# Patient Record
Sex: Female | Born: 1945
Health system: Southern US, Community
[De-identification: ages and names within clinical notes are randomized; demographics above are authoritative.]

## PROBLEM LIST (undated history)

## (undated) DIAGNOSIS — K5790 Diverticulosis of intestine, part unspecified, without perforation or abscess without bleeding: Secondary | ICD-10-CM

## (undated) DIAGNOSIS — M1990 Unspecified osteoarthritis, unspecified site: Secondary | ICD-10-CM

## (undated) DIAGNOSIS — I10 Essential (primary) hypertension: Secondary | ICD-10-CM

## (undated) DIAGNOSIS — E119 Type 2 diabetes mellitus without complications: Secondary | ICD-10-CM

## (undated) HISTORY — PX: DILATION AND CURETTAGE OF UTERUS: SHX78

## (undated) HISTORY — PX: TUBAL LIGATION: SHX77

## (undated) HISTORY — PX: CATARACT EXTRACTION: SUR2

---

## 2001-07-25 ENCOUNTER — Other Ambulatory Visit: Admission: RE | Admit: 2001-07-25 | Discharge: 2001-07-25 | Payer: Self-pay | Admitting: Family Medicine

## 2005-08-13 ENCOUNTER — Ambulatory Visit: Payer: Self-pay | Admitting: Family Medicine

## 2007-03-28 ENCOUNTER — Emergency Department: Payer: Self-pay | Admitting: Emergency Medicine

## 2007-03-29 ENCOUNTER — Ambulatory Visit: Payer: Self-pay | Admitting: Urology

## 2007-03-30 ENCOUNTER — Ambulatory Visit: Payer: Self-pay | Admitting: Urology

## 2007-03-30 ENCOUNTER — Other Ambulatory Visit: Payer: Self-pay

## 2007-03-31 ENCOUNTER — Ambulatory Visit: Payer: Self-pay | Admitting: Urology

## 2007-04-11 ENCOUNTER — Ambulatory Visit: Payer: Self-pay | Admitting: Urology

## 2008-02-17 ENCOUNTER — Ambulatory Visit: Payer: Self-pay | Admitting: Family Medicine

## 2009-10-14 ENCOUNTER — Ambulatory Visit: Payer: Self-pay | Admitting: Family Medicine

## 2009-10-16 ENCOUNTER — Ambulatory Visit: Payer: Self-pay | Admitting: Family Medicine

## 2011-05-21 ENCOUNTER — Ambulatory Visit: Payer: Self-pay | Admitting: Family Medicine

## 2011-12-26 ENCOUNTER — Emergency Department: Payer: Self-pay | Admitting: Unknown Physician Specialty

## 2011-12-26 LAB — CBC
HGB: 15.1 g/dL (ref 12.0–16.0)
MCH: 32.4 pg (ref 26.0–34.0)
MCV: 94 fL (ref 80–100)
Platelet: 251 10*3/uL (ref 150–440)
RDW: 13.9 % (ref 11.5–14.5)

## 2011-12-26 LAB — COMPREHENSIVE METABOLIC PANEL
Alkaline Phosphatase: 89 U/L (ref 50–136)
Anion Gap: 11 (ref 7–16)
Calcium, Total: 9.2 mg/dL (ref 8.5–10.1)
Co2: 26 mmol/L (ref 21–32)
EGFR (African American): >60
EGFR (Non-African Amer.): >60
Glucose: 90 mg/dL (ref 65–99)
Osmolality: 281 (ref 275–301)
Potassium: 3.6 mmol/L (ref 3.5–5.1)
SGOT(AST): 22 U/L (ref 15–37)
SGPT (ALT): 45 U/L (ref 12–78)
Sodium: 140 mmol/L (ref 136–145)
Total Protein: 8.2 g/dL (ref 6.4–8.2)

## 2011-12-26 LAB — URINALYSIS, COMPLETE
Blood: NEGATIVE
Leukocyte Esterase: NEGATIVE
Nitrite: NEGATIVE
Ph: 7 (ref 4.5–8.0)
Squamous Epithelial: 1

## 2012-05-16 ENCOUNTER — Ambulatory Visit: Payer: Self-pay | Admitting: Family Medicine

## 2012-06-06 ENCOUNTER — Ambulatory Visit: Payer: Self-pay | Admitting: Family Medicine

## 2013-05-09 DIAGNOSIS — H02839 Dermatochalasis of unspecified eye, unspecified eyelid: Secondary | ICD-10-CM | POA: Diagnosis not present

## 2013-05-09 DIAGNOSIS — H25049 Posterior subcapsular polar age-related cataract, unspecified eye: Secondary | ICD-10-CM | POA: Diagnosis not present

## 2013-05-09 DIAGNOSIS — H25019 Cortical age-related cataract, unspecified eye: Secondary | ICD-10-CM | POA: Diagnosis not present

## 2013-05-09 DIAGNOSIS — H2510 Age-related nuclear cataract, unspecified eye: Secondary | ICD-10-CM | POA: Diagnosis not present

## 2013-05-09 DIAGNOSIS — H18419 Arcus senilis, unspecified eye: Secondary | ICD-10-CM | POA: Diagnosis not present

## 2013-06-06 DIAGNOSIS — M545 Low back pain, unspecified: Secondary | ICD-10-CM | POA: Diagnosis not present

## 2013-06-06 DIAGNOSIS — M542 Cervicalgia: Secondary | ICD-10-CM | POA: Diagnosis not present

## 2013-06-06 DIAGNOSIS — M546 Pain in thoracic spine: Secondary | ICD-10-CM | POA: Diagnosis not present

## 2013-06-06 DIAGNOSIS — M5137 Other intervertebral disc degeneration, lumbosacral region: Secondary | ICD-10-CM | POA: Diagnosis not present

## 2013-06-06 DIAGNOSIS — M9981 Other biomechanical lesions of cervical region: Secondary | ICD-10-CM | POA: Diagnosis not present

## 2013-06-06 DIAGNOSIS — M999 Biomechanical lesion, unspecified: Secondary | ICD-10-CM | POA: Diagnosis not present

## 2013-06-06 DIAGNOSIS — M5030 Other cervical disc degeneration, unspecified cervical region: Secondary | ICD-10-CM | POA: Diagnosis not present

## 2013-06-07 DIAGNOSIS — M545 Low back pain, unspecified: Secondary | ICD-10-CM | POA: Diagnosis not present

## 2013-06-07 DIAGNOSIS — M546 Pain in thoracic spine: Secondary | ICD-10-CM | POA: Diagnosis not present

## 2013-06-07 DIAGNOSIS — M5137 Other intervertebral disc degeneration, lumbosacral region: Secondary | ICD-10-CM | POA: Diagnosis not present

## 2013-06-07 DIAGNOSIS — M542 Cervicalgia: Secondary | ICD-10-CM | POA: Diagnosis not present

## 2013-06-07 DIAGNOSIS — M5030 Other cervical disc degeneration, unspecified cervical region: Secondary | ICD-10-CM | POA: Diagnosis not present

## 2013-06-07 DIAGNOSIS — M999 Biomechanical lesion, unspecified: Secondary | ICD-10-CM | POA: Diagnosis not present

## 2013-06-07 DIAGNOSIS — M9981 Other biomechanical lesions of cervical region: Secondary | ICD-10-CM | POA: Diagnosis not present

## 2013-06-09 DIAGNOSIS — M545 Low back pain, unspecified: Secondary | ICD-10-CM | POA: Diagnosis not present

## 2013-06-09 DIAGNOSIS — M542 Cervicalgia: Secondary | ICD-10-CM | POA: Diagnosis not present

## 2013-06-09 DIAGNOSIS — M999 Biomechanical lesion, unspecified: Secondary | ICD-10-CM | POA: Diagnosis not present

## 2013-06-09 DIAGNOSIS — M5030 Other cervical disc degeneration, unspecified cervical region: Secondary | ICD-10-CM | POA: Diagnosis not present

## 2013-06-09 DIAGNOSIS — M5137 Other intervertebral disc degeneration, lumbosacral region: Secondary | ICD-10-CM | POA: Diagnosis not present

## 2013-06-09 DIAGNOSIS — M9981 Other biomechanical lesions of cervical region: Secondary | ICD-10-CM | POA: Diagnosis not present

## 2013-06-09 DIAGNOSIS — M546 Pain in thoracic spine: Secondary | ICD-10-CM | POA: Diagnosis not present

## 2013-06-12 DIAGNOSIS — M999 Biomechanical lesion, unspecified: Secondary | ICD-10-CM | POA: Diagnosis not present

## 2013-06-12 DIAGNOSIS — M5030 Other cervical disc degeneration, unspecified cervical region: Secondary | ICD-10-CM | POA: Diagnosis not present

## 2013-06-12 DIAGNOSIS — M545 Low back pain, unspecified: Secondary | ICD-10-CM | POA: Diagnosis not present

## 2013-06-12 DIAGNOSIS — M9981 Other biomechanical lesions of cervical region: Secondary | ICD-10-CM | POA: Diagnosis not present

## 2013-06-12 DIAGNOSIS — M542 Cervicalgia: Secondary | ICD-10-CM | POA: Diagnosis not present

## 2013-06-12 DIAGNOSIS — M546 Pain in thoracic spine: Secondary | ICD-10-CM | POA: Diagnosis not present

## 2013-06-12 DIAGNOSIS — M5137 Other intervertebral disc degeneration, lumbosacral region: Secondary | ICD-10-CM | POA: Diagnosis not present

## 2013-06-16 DIAGNOSIS — M5030 Other cervical disc degeneration, unspecified cervical region: Secondary | ICD-10-CM | POA: Diagnosis not present

## 2013-06-16 DIAGNOSIS — M999 Biomechanical lesion, unspecified: Secondary | ICD-10-CM | POA: Diagnosis not present

## 2013-06-16 DIAGNOSIS — M542 Cervicalgia: Secondary | ICD-10-CM | POA: Diagnosis not present

## 2013-06-16 DIAGNOSIS — M9981 Other biomechanical lesions of cervical region: Secondary | ICD-10-CM | POA: Diagnosis not present

## 2013-06-16 DIAGNOSIS — M5137 Other intervertebral disc degeneration, lumbosacral region: Secondary | ICD-10-CM | POA: Diagnosis not present

## 2013-06-16 DIAGNOSIS — M545 Low back pain, unspecified: Secondary | ICD-10-CM | POA: Diagnosis not present

## 2013-06-16 DIAGNOSIS — M546 Pain in thoracic spine: Secondary | ICD-10-CM | POA: Diagnosis not present

## 2013-06-30 DIAGNOSIS — M999 Biomechanical lesion, unspecified: Secondary | ICD-10-CM | POA: Diagnosis not present

## 2013-06-30 DIAGNOSIS — M5137 Other intervertebral disc degeneration, lumbosacral region: Secondary | ICD-10-CM | POA: Diagnosis not present

## 2013-06-30 DIAGNOSIS — M546 Pain in thoracic spine: Secondary | ICD-10-CM | POA: Diagnosis not present

## 2013-06-30 DIAGNOSIS — M542 Cervicalgia: Secondary | ICD-10-CM | POA: Diagnosis not present

## 2013-06-30 DIAGNOSIS — M9981 Other biomechanical lesions of cervical region: Secondary | ICD-10-CM | POA: Diagnosis not present

## 2013-06-30 DIAGNOSIS — M545 Low back pain, unspecified: Secondary | ICD-10-CM | POA: Diagnosis not present

## 2013-06-30 DIAGNOSIS — M5030 Other cervical disc degeneration, unspecified cervical region: Secondary | ICD-10-CM | POA: Diagnosis not present

## 2013-07-03 DIAGNOSIS — H52209 Unspecified astigmatism, unspecified eye: Secondary | ICD-10-CM | POA: Diagnosis not present

## 2013-07-03 DIAGNOSIS — H269 Unspecified cataract: Secondary | ICD-10-CM | POA: Diagnosis not present

## 2013-07-03 DIAGNOSIS — H2510 Age-related nuclear cataract, unspecified eye: Secondary | ICD-10-CM | POA: Diagnosis not present

## 2013-09-05 ENCOUNTER — Ambulatory Visit: Payer: Self-pay | Admitting: Family Medicine

## 2013-09-05 DIAGNOSIS — Z111 Encounter for screening for respiratory tuberculosis: Secondary | ICD-10-CM | POA: Diagnosis not present

## 2013-09-05 DIAGNOSIS — Z01419 Encounter for gynecological examination (general) (routine) without abnormal findings: Secondary | ICD-10-CM | POA: Diagnosis not present

## 2013-09-05 DIAGNOSIS — Z1342 Encounter for screening for global developmental delays (milestones): Secondary | ICD-10-CM | POA: Diagnosis not present

## 2013-09-05 DIAGNOSIS — R7309 Other abnormal glucose: Secondary | ICD-10-CM | POA: Diagnosis not present

## 2013-09-05 DIAGNOSIS — E7800 Pure hypercholesterolemia, unspecified: Secondary | ICD-10-CM | POA: Diagnosis not present

## 2013-09-05 DIAGNOSIS — Z1231 Encounter for screening mammogram for malignant neoplasm of breast: Secondary | ICD-10-CM | POA: Diagnosis not present

## 2013-09-05 DIAGNOSIS — Z0000 Encounter for general adult medical examination without abnormal findings: Secondary | ICD-10-CM | POA: Diagnosis not present

## 2013-09-05 DIAGNOSIS — Z1331 Encounter for screening for depression: Secondary | ICD-10-CM | POA: Diagnosis not present

## 2013-09-05 DIAGNOSIS — J069 Acute upper respiratory infection, unspecified: Secondary | ICD-10-CM | POA: Diagnosis not present

## 2013-09-05 DIAGNOSIS — Z1330 Encounter for screening examination for mental health and behavioral disorders, unspecified: Secondary | ICD-10-CM | POA: Diagnosis not present

## 2013-09-05 DIAGNOSIS — I10 Essential (primary) hypertension: Secondary | ICD-10-CM | POA: Diagnosis not present

## 2013-09-19 DIAGNOSIS — Z111 Encounter for screening for respiratory tuberculosis: Secondary | ICD-10-CM | POA: Diagnosis not present

## 2013-09-19 DIAGNOSIS — E7800 Pure hypercholesterolemia, unspecified: Secondary | ICD-10-CM | POA: Diagnosis not present

## 2013-09-19 DIAGNOSIS — R319 Hematuria, unspecified: Secondary | ICD-10-CM | POA: Diagnosis not present

## 2013-09-19 DIAGNOSIS — I10 Essential (primary) hypertension: Secondary | ICD-10-CM | POA: Diagnosis not present

## 2013-09-19 DIAGNOSIS — Z0000 Encounter for general adult medical examination without abnormal findings: Secondary | ICD-10-CM | POA: Diagnosis not present

## 2013-10-05 DIAGNOSIS — L570 Actinic keratosis: Secondary | ICD-10-CM | POA: Diagnosis not present

## 2013-10-05 DIAGNOSIS — L819 Disorder of pigmentation, unspecified: Secondary | ICD-10-CM | POA: Diagnosis not present

## 2013-10-17 DIAGNOSIS — Z8371 Family history of colonic polyps: Secondary | ICD-10-CM | POA: Diagnosis not present

## 2013-10-17 DIAGNOSIS — D126 Benign neoplasm of colon, unspecified: Secondary | ICD-10-CM | POA: Diagnosis not present

## 2013-10-17 DIAGNOSIS — Z1211 Encounter for screening for malignant neoplasm of colon: Secondary | ICD-10-CM | POA: Diagnosis not present

## 2013-10-17 DIAGNOSIS — K5730 Diverticulosis of large intestine without perforation or abscess without bleeding: Secondary | ICD-10-CM | POA: Diagnosis not present

## 2013-10-17 DIAGNOSIS — K648 Other hemorrhoids: Secondary | ICD-10-CM | POA: Diagnosis not present

## 2013-10-17 DIAGNOSIS — Z86010 Personal history of colonic polyps: Secondary | ICD-10-CM | POA: Diagnosis not present

## 2013-10-17 DIAGNOSIS — Z09 Encounter for follow-up examination after completed treatment for conditions other than malignant neoplasm: Secondary | ICD-10-CM | POA: Diagnosis not present

## 2013-10-17 DIAGNOSIS — K621 Rectal polyp: Secondary | ICD-10-CM | POA: Diagnosis not present

## 2013-10-17 DIAGNOSIS — K620 Anal polyp: Secondary | ICD-10-CM | POA: Diagnosis not present

## 2013-10-17 LAB — HM COLONOSCOPY

## 2013-10-23 DIAGNOSIS — N2889 Other specified disorders of kidney and ureter: Secondary | ICD-10-CM | POA: Insufficient documentation

## 2013-10-23 DIAGNOSIS — R3129 Other microscopic hematuria: Secondary | ICD-10-CM | POA: Diagnosis not present

## 2013-10-23 DIAGNOSIS — N390 Urinary tract infection, site not specified: Secondary | ICD-10-CM | POA: Diagnosis not present

## 2013-10-23 DIAGNOSIS — I10 Essential (primary) hypertension: Secondary | ICD-10-CM | POA: Diagnosis not present

## 2013-10-23 DIAGNOSIS — E785 Hyperlipidemia, unspecified: Secondary | ICD-10-CM | POA: Diagnosis not present

## 2013-10-23 DIAGNOSIS — N200 Calculus of kidney: Secondary | ICD-10-CM | POA: Insufficient documentation

## 2013-10-23 DIAGNOSIS — N289 Disorder of kidney and ureter, unspecified: Secondary | ICD-10-CM | POA: Diagnosis not present

## 2013-11-07 DIAGNOSIS — K5730 Diverticulosis of large intestine without perforation or abscess without bleeding: Secondary | ICD-10-CM | POA: Diagnosis not present

## 2013-11-07 DIAGNOSIS — N200 Calculus of kidney: Secondary | ICD-10-CM | POA: Diagnosis not present

## 2013-11-07 DIAGNOSIS — R3129 Other microscopic hematuria: Secondary | ICD-10-CM | POA: Diagnosis not present

## 2013-11-13 DIAGNOSIS — N390 Urinary tract infection, site not specified: Secondary | ICD-10-CM | POA: Diagnosis not present

## 2013-11-13 DIAGNOSIS — R3129 Other microscopic hematuria: Secondary | ICD-10-CM | POA: Diagnosis not present

## 2014-01-16 DIAGNOSIS — L820 Inflamed seborrheic keratosis: Secondary | ICD-10-CM | POA: Diagnosis not present

## 2014-01-16 DIAGNOSIS — Z85828 Personal history of other malignant neoplasm of skin: Secondary | ICD-10-CM | POA: Diagnosis not present

## 2014-01-16 DIAGNOSIS — L821 Other seborrheic keratosis: Secondary | ICD-10-CM | POA: Diagnosis not present

## 2014-01-16 DIAGNOSIS — L570 Actinic keratosis: Secondary | ICD-10-CM | POA: Diagnosis not present

## 2014-01-16 DIAGNOSIS — L578 Other skin changes due to chronic exposure to nonionizing radiation: Secondary | ICD-10-CM | POA: Diagnosis not present

## 2014-01-16 DIAGNOSIS — L719 Rosacea, unspecified: Secondary | ICD-10-CM | POA: Diagnosis not present

## 2014-04-24 DIAGNOSIS — J208 Acute bronchitis due to other specified organisms: Secondary | ICD-10-CM | POA: Diagnosis not present

## 2014-07-12 DIAGNOSIS — J209 Acute bronchitis, unspecified: Secondary | ICD-10-CM | POA: Diagnosis not present

## 2014-07-12 DIAGNOSIS — I10 Essential (primary) hypertension: Secondary | ICD-10-CM | POA: Diagnosis not present

## 2014-07-12 DIAGNOSIS — R05 Cough: Secondary | ICD-10-CM | POA: Diagnosis not present

## 2014-07-31 DIAGNOSIS — I10 Essential (primary) hypertension: Secondary | ICD-10-CM | POA: Diagnosis not present

## 2014-08-10 LAB — BASIC METABOLIC PANEL
BUN: 13 mg/dL (ref 4–21)
CREATININE: 0.8 mg/dL (ref 0.5–1.1)
Glucose: 114 mg/dL
POTASSIUM: 4.8 mmol/L (ref 3.4–5.3)
SODIUM: 139 mmol/L (ref 137–147)

## 2014-08-10 LAB — HEPATIC FUNCTION PANEL
ALK PHOS: 61 U/L (ref 25–125)
ALT: 91 U/L — AB (ref 7–35)
AST: 49 U/L — AB (ref 13–35)
Bilirubin, Total: 1.1 mg/dL

## 2014-08-14 DIAGNOSIS — D485 Neoplasm of uncertain behavior of skin: Secondary | ICD-10-CM | POA: Diagnosis not present

## 2014-08-14 DIAGNOSIS — D1801 Hemangioma of skin and subcutaneous tissue: Secondary | ICD-10-CM | POA: Diagnosis not present

## 2014-08-14 DIAGNOSIS — L859 Epidermal thickening, unspecified: Secondary | ICD-10-CM | POA: Diagnosis not present

## 2014-09-18 ENCOUNTER — Ambulatory Visit
Admit: 2014-09-18 | Disposition: A | Discharge status: Home / Self Care | Payer: Self-pay | Attending: Family Medicine | Admitting: Family Medicine

## 2014-09-18 DIAGNOSIS — Z1231 Encounter for screening mammogram for malignant neoplasm of breast: Secondary | ICD-10-CM | POA: Diagnosis not present

## 2015-03-05 DIAGNOSIS — M79672 Pain in left foot: Secondary | ICD-10-CM | POA: Diagnosis not present

## 2015-03-05 DIAGNOSIS — M7662 Achilles tendinitis, left leg: Secondary | ICD-10-CM | POA: Diagnosis not present

## 2015-05-14 DIAGNOSIS — Z961 Presence of intraocular lens: Secondary | ICD-10-CM | POA: Diagnosis not present

## 2015-06-18 ENCOUNTER — Other Ambulatory Visit: Payer: Self-pay | Admitting: Family Medicine

## 2015-07-03 DIAGNOSIS — F419 Anxiety disorder, unspecified: Secondary | ICD-10-CM | POA: Insufficient documentation

## 2015-07-03 DIAGNOSIS — E7800 Pure hypercholesterolemia, unspecified: Secondary | ICD-10-CM | POA: Insufficient documentation

## 2015-07-03 DIAGNOSIS — I10 Essential (primary) hypertension: Secondary | ICD-10-CM | POA: Insufficient documentation

## 2015-07-03 DIAGNOSIS — K635 Polyp of colon: Secondary | ICD-10-CM | POA: Insufficient documentation

## 2015-07-03 DIAGNOSIS — K5792 Diverticulitis of intestine, part unspecified, without perforation or abscess without bleeding: Secondary | ICD-10-CM | POA: Insufficient documentation

## 2015-07-03 DIAGNOSIS — E1159 Type 2 diabetes mellitus with other circulatory complications: Secondary | ICD-10-CM | POA: Insufficient documentation

## 2015-07-03 DIAGNOSIS — R7303 Prediabetes: Secondary | ICD-10-CM | POA: Insufficient documentation

## 2015-08-13 DIAGNOSIS — L570 Actinic keratosis: Secondary | ICD-10-CM | POA: Diagnosis not present

## 2015-08-13 DIAGNOSIS — R21 Rash and other nonspecific skin eruption: Secondary | ICD-10-CM | POA: Diagnosis not present

## 2015-08-13 DIAGNOSIS — L718 Other rosacea: Secondary | ICD-10-CM | POA: Diagnosis not present

## 2015-08-13 DIAGNOSIS — L578 Other skin changes due to chronic exposure to nonionizing radiation: Secondary | ICD-10-CM | POA: Diagnosis not present

## 2015-08-13 DIAGNOSIS — Z85828 Personal history of other malignant neoplasm of skin: Secondary | ICD-10-CM | POA: Diagnosis not present

## 2015-08-13 DIAGNOSIS — L821 Other seborrheic keratosis: Secondary | ICD-10-CM | POA: Diagnosis not present

## 2015-08-13 DIAGNOSIS — D229 Melanocytic nevi, unspecified: Secondary | ICD-10-CM | POA: Diagnosis not present

## 2015-08-13 DIAGNOSIS — L814 Other melanin hyperpigmentation: Secondary | ICD-10-CM | POA: Diagnosis not present

## 2015-08-14 DIAGNOSIS — M7521 Bicipital tendinitis, right shoulder: Secondary | ICD-10-CM | POA: Diagnosis not present

## 2015-08-14 DIAGNOSIS — M7541 Impingement syndrome of right shoulder: Secondary | ICD-10-CM | POA: Diagnosis not present

## 2015-08-14 DIAGNOSIS — M7540 Impingement syndrome of unspecified shoulder: Secondary | ICD-10-CM | POA: Insufficient documentation

## 2015-08-14 DIAGNOSIS — M7520 Bicipital tendinitis, unspecified shoulder: Secondary | ICD-10-CM | POA: Insufficient documentation

## 2015-08-20 ENCOUNTER — Encounter: Payer: Self-pay | Admitting: Family Medicine

## 2015-08-30 ENCOUNTER — Inpatient Hospital Stay
Admission: EM | Admit: 2015-08-30 | Discharge: 2015-09-01 | DRG: 392 | Disposition: A | Discharge status: Home / Self Care | Payer: Medicare Other | Attending: General Surgery | Admitting: General Surgery

## 2015-08-30 ENCOUNTER — Emergency Department: Payer: Medicare Other

## 2015-08-30 ENCOUNTER — Encounter: Payer: Self-pay | Admitting: *Deleted

## 2015-08-30 DIAGNOSIS — Z79899 Other long term (current) drug therapy: Secondary | ICD-10-CM | POA: Diagnosis not present

## 2015-08-30 DIAGNOSIS — Z8052 Family history of malignant neoplasm of bladder: Secondary | ICD-10-CM | POA: Diagnosis not present

## 2015-08-30 DIAGNOSIS — Z811 Family history of alcohol abuse and dependence: Secondary | ICD-10-CM

## 2015-08-30 DIAGNOSIS — K5792 Diverticulitis of intestine, part unspecified, without perforation or abscess without bleeding: Secondary | ICD-10-CM | POA: Diagnosis not present

## 2015-08-30 DIAGNOSIS — Z8249 Family history of ischemic heart disease and other diseases of the circulatory system: Secondary | ICD-10-CM | POA: Diagnosis not present

## 2015-08-30 DIAGNOSIS — Z886 Allergy status to analgesic agent status: Secondary | ICD-10-CM | POA: Diagnosis not present

## 2015-08-30 DIAGNOSIS — I10 Essential (primary) hypertension: Secondary | ICD-10-CM | POA: Diagnosis present

## 2015-08-30 DIAGNOSIS — K5720 Diverticulitis of large intestine with perforation and abscess without bleeding: Secondary | ICD-10-CM | POA: Diagnosis not present

## 2015-08-30 DIAGNOSIS — Z833 Family history of diabetes mellitus: Secondary | ICD-10-CM | POA: Diagnosis not present

## 2015-08-30 DIAGNOSIS — K5732 Diverticulitis of large intestine without perforation or abscess without bleeding: Secondary | ICD-10-CM | POA: Diagnosis not present

## 2015-08-30 DIAGNOSIS — Z9849 Cataract extraction status, unspecified eye: Secondary | ICD-10-CM | POA: Diagnosis not present

## 2015-08-30 DIAGNOSIS — M1990 Unspecified osteoarthritis, unspecified site: Secondary | ICD-10-CM | POA: Diagnosis present

## 2015-08-30 DIAGNOSIS — Z803 Family history of malignant neoplasm of breast: Secondary | ICD-10-CM | POA: Diagnosis not present

## 2015-08-30 HISTORY — DX: Unspecified osteoarthritis, unspecified site: M19.90

## 2015-08-30 HISTORY — DX: Diverticulosis of intestine, part unspecified, without perforation or abscess without bleeding: K57.90

## 2015-08-30 HISTORY — DX: Essential (primary) hypertension: I10

## 2015-08-30 LAB — APTT: APTT: 28 s (ref 24–36)

## 2015-08-30 LAB — URINALYSIS COMPLETE WITH MICROSCOPIC (ARMC ONLY)
BACTERIA UA: NONE SEEN
BILIRUBIN URINE: NEGATIVE
GLUCOSE, UA: NEGATIVE mg/dL
KETONES UR: NEGATIVE mg/dL
Nitrite: NEGATIVE
Protein, ur: NEGATIVE mg/dL
Specific Gravity, Urine: 1.006 (ref 1.005–1.030)
pH: 7 (ref 5.0–8.0)

## 2015-08-30 LAB — PROTIME-INR
INR: 1
Prothrombin Time: 13.4 seconds (ref 11.4–15.0)

## 2015-08-30 LAB — CBC
HCT: 46 % (ref 35.0–47.0)
Hemoglobin: 15.9 g/dL (ref 12.0–16.0)
MCH: 31.9 pg (ref 26.0–34.0)
MCHC: 34.5 g/dL (ref 32.0–36.0)
MCV: 92.5 fL (ref 80.0–100.0)
PLATELETS: 218 10*3/uL (ref 150–440)
RBC: 4.98 MIL/uL (ref 3.80–5.20)
RDW: 13.7 % (ref 11.5–14.5)
WBC: 15.2 10*3/uL — AB (ref 3.6–11.0)

## 2015-08-30 LAB — COMPREHENSIVE METABOLIC PANEL
ALBUMIN: 3.7 g/dL (ref 3.5–5.0)
ALT: 31 U/L (ref 14–54)
AST: 21 U/L (ref 15–41)
Alkaline Phosphatase: 60 U/L (ref 38–126)
Anion gap: 8 (ref 5–15)
BUN: 16 mg/dL (ref 6–20)
CHLORIDE: 103 mmol/L (ref 101–111)
CO2: 25 mmol/L (ref 22–32)
CREATININE: 0.63 mg/dL (ref 0.44–1.00)
Calcium: 9.1 mg/dL (ref 8.9–10.3)
GFR calc Af Amer: >60 mL/min (ref >60)
GLUCOSE: 99 mg/dL (ref 65–99)
Potassium: 3.9 mmol/L (ref 3.5–5.1)
Sodium: 136 mmol/L (ref 135–145)
Total Bilirubin: 1.5 mg/dL — ABNORMAL HIGH (ref 0.3–1.2)
Total Protein: 7.5 g/dL (ref 6.5–8.1)

## 2015-08-30 LAB — TYPE AND SCREEN
ABO/RH(D): A POS
Antibody Screen: NEGATIVE

## 2015-08-30 LAB — LIPASE, BLOOD: LIPASE: 22 U/L (ref 11–51)

## 2015-08-30 LAB — ABO/RH: ABO/RH(D): A POS

## 2015-08-30 MED ORDER — LACTATED RINGERS IV SOLN
INTRAVENOUS | Status: DC
  Administered 2015-08-30 – 2015-09-01 (×5): via INTRAVENOUS

## 2015-08-30 MED ORDER — PIPERACILLIN-TAZOBACTAM 3.375 G IVPB
3.3750 g | Freq: Three times a day (TID) | INTRAVENOUS | Status: DC
  Administered 2015-08-30 – 2015-09-01 (×5): 3.375 g via INTRAVENOUS
  Filled 2015-08-30 (×7): qty 50

## 2015-08-30 MED ORDER — DIATRIZOATE MEGLUMINE & SODIUM 66-10 % PO SOLN
15.0000 mL | Freq: Once | ORAL | Status: AC
  Administered 2015-08-30: 15 mL via ORAL

## 2015-08-30 MED ORDER — ONDANSETRON HCL 4 MG/2ML IJ SOLN: 4.0000 mg | Freq: Four times a day (QID) | INTRAMUSCULAR | Status: DC | PRN

## 2015-08-30 MED ORDER — ONDANSETRON 8 MG PO TBDP: 4.0000 mg | ORAL_TABLET | Freq: Four times a day (QID) | ORAL | Status: DC | PRN

## 2015-08-30 MED ORDER — KETOROLAC TROMETHAMINE 15 MG/ML IJ SOLN: 15.0000 mg | Freq: Four times a day (QID) | INTRAMUSCULAR | Status: DC | PRN

## 2015-08-30 MED ORDER — SODIUM CHLORIDE 0.9 % IV SOLN
Freq: Once | INTRAVENOUS | Status: AC
  Administered 2015-08-30: 20:00:00 via INTRAVENOUS

## 2015-08-30 MED ORDER — DIPHENHYDRAMINE HCL 12.5 MG/5ML PO ELIX: 12.5000 mg | ORAL_SOLUTION | Freq: Four times a day (QID) | ORAL | Status: DC | PRN

## 2015-08-30 MED ORDER — HYDRALAZINE HCL 20 MG/ML IJ SOLN: 10.0000 mg | INTRAMUSCULAR | Status: DC | PRN

## 2015-08-30 MED ORDER — PIPERACILLIN-TAZOBACTAM 3.375 G IVPB 30 MIN
3.3750 g | Freq: Once | INTRAVENOUS | Status: AC
  Administered 2015-08-30: 3.375 g via INTRAVENOUS
  Filled 2015-08-30: qty 50

## 2015-08-30 MED ORDER — PANTOPRAZOLE SODIUM 40 MG IV SOLR
40.0000 mg | Freq: Every day | INTRAVENOUS | Status: DC
  Administered 2015-08-30 – 2015-08-31 (×2): 40 mg via INTRAVENOUS
  Filled 2015-08-30 (×2): qty 40

## 2015-08-30 MED ORDER — ENOXAPARIN SODIUM 40 MG/0.4ML ~~LOC~~ SOLN
40.0000 mg | SUBCUTANEOUS | Status: DC
  Administered 2015-08-30 – 2015-08-31 (×2): 40 mg via SUBCUTANEOUS
  Filled 2015-08-30 (×2): qty 0.4

## 2015-08-30 MED ORDER — DIPHENHYDRAMINE HCL 50 MG/ML IJ SOLN: 12.5000 mg | Freq: Four times a day (QID) | INTRAMUSCULAR | Status: DC | PRN

## 2015-08-30 MED ORDER — SODIUM CHLORIDE 0.9 % IV BOLUS (SEPSIS)
500.0000 mL | Freq: Once | INTRAVENOUS | Status: AC
  Administered 2015-08-30: 500 mL via INTRAVENOUS

## 2015-08-30 MED ORDER — IOPAMIDOL (ISOVUE-300) INJECTION 61%
100.0000 mL | Freq: Once | INTRAVENOUS | Status: AC | PRN
  Administered 2015-08-30: 100 mL via INTRAVENOUS

## 2015-08-30 MED ORDER — MORPHINE SULFATE (PF) 4 MG/ML IV SOLN: 4.0000 mg | INTRAVENOUS | Status: DC | PRN

## 2015-08-30 NOTE — ED Provider Notes (Addendum)
[facility] Emergency Department Provider Note  ____________________________________________   I have reviewed the triage vital signs and the nursing notes.   HISTORY  Chief Complaint Abdominal Pain    HPI Nicole Tucker is a 70 y.o. female with a history of diverticulitis and kidney stones as well as arthritis presents today with lower abdominal abdominal pain which waxes and wanes. Sick pressure sometimes sometimes it's sharp, sometimes it feels like is burning. Very poorly described. Is on both sides of her abdomen right and left. Is very deep in her lower abdomen. She had no chest pain or shortness of breath or upper abdominal pain. She states she feels that this is similar to her prior diverticulitis. Patient did have diarrhea 3 or 4 days ago with a completely resolved. She has not vomited. She's had no fever. She is taking good by mouth. Nothing that she can think of makes the pain better or worse. She has no dysuria or urinary frequency. She states that she did have Pepto-Bismol and then had some dark stools after that. No history of GI bleeding. Patient states that she feels "foggy" but she has not had any other symptoms aside from those enumerated. This time, her states her pain is very low intensity although sometimes he gets worse. It usually lasts for 3-4 minutes and then kind of eating. Has no flank pain. There is no other radiation. She can tell me anything else about the timing of it. Aside from that already mentioned no other associated symptoms.    Past Medical History  Diagnosis Date  . Arthritis   . Diverticular disease     Patient Active Problem List   Diagnosis Date Noted  . Anxiety 07/03/2015  . Diverticulitis 07/03/2015  . Benign essential HTN 07/03/2015  . Colon polyp 07/03/2015  . Borderline diabetes 07/03/2015  . Hypercholesterolemia without hypertriglyceridemia 07/03/2015    Past Surgical History  Procedure Laterality Date  .  Cataract extraction      Current Outpatient Rx  Name  Route  Sig  Dispense  Refill  . ALPRAZolam (XANAX) 0.5 MG tablet   Oral   Take by mouth.         Marland Kitchen amLODipine (NORVASC) 5 MG tablet   Oral   Take by mouth.         Marland Kitchen ascorbic acid (VITAMIN C) 100 MG tablet   Oral   Take by mouth.         . cholecalciferol (VITAMIN D-400) 400 units TABS tablet   Oral   Take by mouth.         . hydrochlorothiazide (HYDRODIURIL) 25 MG tablet   Oral   Take by mouth.         . Multiple Vitamin tablet   Oral   Take by mouth.         . Omega-3 Fatty Acids (FISH OIL) 1200 MG CAPS   Oral   Take by mouth.           Allergies Codeine  Family History  Problem Relation Age of Onset  . Heart disease Mother   . Hypertension Mother   . Aneurysm Mother   . Alcohol abuse Father   . Bladder Cancer Sister   . Diabetes Paternal Grandmother   . Heart disease Paternal Grandmother   . Breast cancer Sister     Social History Social History  Substance Use Topics  . Smoking status: Never Smoker   . Smokeless tobacco: None  . Alcohol  Use: None    Review of Systems Constitutional: No fever/chills Eyes: No visual changes. ENT: No sore throat. No stiff neck no neck pain Cardiovascular: Denies chest pain. Respiratory: Denies shortness of breath. Gastrointestinal:   no vomiting.  No diarrhea.  No constipation. Genitourinary: Negative for dysuria. Musculoskeletal: Negative lower extremity swelling Skin: Negative for rash. Neurological: Negative for headaches, focal weakness or numbness. 10-point ROS otherwise negative.  ____________________________________________   PHYSICAL EXAM:  VITAL SIGNS: ED Triage Vitals  Enc Vitals Group     BP 08/30/15 1353 182/86 mmHg     Pulse Rate 08/30/15 1353 68     Resp 08/30/15 1353 18     Temp 08/30/15 1353 98.4 F (36.9 C)     Temp Source 08/30/15 1353 Oral     SpO2 08/30/15 1353 95 %     Weight 08/30/15 1353 170 lb (77.111 kg)      Height 08/30/15 1353 5\' 3"  (1.6 m)     Head Cir --      Peak Flow --      Pain Score 08/30/15 1354 2     Pain Loc --      Pain Edu? --      Excl. in [place]? --     Constitutional: Alert and oriented. Well appearing and in no acute distress. Eyes: Conjunctivae are normal. PERRL. EOMI. Head: Atraumatic. Nose: No congestion/rhinnorhea. Mouth/Throat: Mucous membranes are moist.  Oropharynx non-erythematous. Neck: No stridor.   Nontender with no meningismus Cardiovascular: Normal rate, regular rhythm. Grossly normal heart sounds.  Good peripheral circulation. Respiratory: Normal respiratory effort.  No retractions. Lungs CTAB. Abdominal: Soft and nonsurgical, there is tenderness to palpation of the lower suprapubic region more on the right than the left.  No distention. No guarding no rebound Back:  There is no focal tenderness or step off there is no midline tenderness there are no lesions noted. there is no CVA tenderness Musculoskeletal: No lower extremity tenderness. No joint effusions, no DVT signs strong distal pulses no edema Neurologic:  Normal speech and language. No gross focal neurologic deficits are appreciated.  Skin:  Skin is warm, dry and intact. No rash noted. Psychiatric: Mood and affect are normal. Speech and behavior are normal.  ____________________________________________   LABS (all labs ordered are listed, but only abnormal results are displayed)  Labs Reviewed  COMPREHENSIVE METABOLIC PANEL - Abnormal; Notable for the following:    Total Bilirubin 1.5 (*)    All other components within normal limits  CBC - Abnormal; Notable for the following:    WBC 15.2 (*)    All other components within normal limits  URINALYSIS COMPLETEWITH MICROSCOPIC ([facility] ONLY) - Abnormal; Notable for the following:    Color, Urine YELLOW (*)    APPearance CLEAR (*)    Hgb urine dipstick 1+ (*)    Leukocytes, UA TRACE (*)    Squamous Epithelial / LPF 0-5 (*)    All other components  within normal limits  LIPASE, BLOOD   ____________________________________________  EKG  I personally interpreted any EKGs ordered by me or triage  ____________________________________________  RADIOLOGY  I reviewed any imaging ordered by me or triage that were performed during my shift and, if possible, patient and/or family made aware of any abnormal findings. ____________________________________________   PROCEDURES  Procedure(s) performed: None  Critical Care performed: None  ____________________________________________   INITIAL IMPRESSION / ASSESSMENT AND PLAN / ED COURSE  Pertinent labs & imaging results that were available during my care of  the patient were reviewed by me and considered in my medical decision making (see chart for details).  Patient with lower abdominal pain, no evidence of hypotension at this time, white count is somewhat elevated, we will obtain a CT scan given her history of reticular disease and reassess. Nonsurgical abdomen at this time. Hemoglobin stable.  ----------------------------------------- 6:14 PM on 08/30/2015 -----------------------------------------  She is guaiac-negative from below, female nurse chaperone present. Positive diverticulitis with abscess. I have discussed with Dr. colon of surgery, who advises antibiotics and will come evaluate the patient. Patient declines pain medication. ____________________________________________   FINAL CLINICAL IMPRESSION(S) / ED DIAGNOSES  Final diagnoses:  None      This chart was dictated using voice recognition software.  Despite best efforts to proofread,  errors can occur which can change meaning.     Schuyler Amor, MD 08/30/15 1637  Schuyler Amor, MD 08/30/15 [phone]

## 2015-08-30 NOTE — ED Notes (Addendum)
States severe lower abd pain for 4-5 days, states hx of diverticulitis, states stools are black and she has a lot of gas, states she feels like she is in a fog since yesterday, pt awake and alert in no acute distress, states she recently started taking tumeric

## 2015-08-30 NOTE — H&P (Signed)
Patient ID: Nicole Tucker, female   DOB: [birth year], 70 y.o.   MRN: [id number]  CC: Diverticulitis  HPI Nicole Tucker is a 70 y.o. female who presents to emergency department today with a 5 day history of intermittent lower abdominal pain. She states she's never had anything like this before. He comes in waves and is sharp. If she is lying down it does not bother her but if she is upright her lower abdomen and B waves of horrible pain. She does have a history of diverticulitis in the remote past but she says that was a constant pain and felt nothing like this. She also reports that she had some diarrhea couple days ago but has not had any last 3 or 4 days. She denies any fevers, chills, nausea, vomiting, chest pain, shortness of breath. She has been able to eat and has not had any problems with her appetite. When the pain happens only last for 3-4 minutes and then goes away. She is otherwise in her usual state of health.  HPI  Past Medical History  Diagnosis Date  . Arthritis   . Diverticular disease   . Hypertension     Past Surgical History  Procedure Laterality Date  . Cataract extraction      Family History  Problem Relation Age of Onset  . Heart disease Mother   . Hypertension Mother   . Aneurysm Mother   . Alcohol abuse Father   . Bladder Cancer Sister   . Diabetes Paternal Grandmother   . Heart disease Paternal Grandmother   . Breast cancer Sister     Social History Social History  Substance Use Topics  . Smoking status: Never Smoker   . Smokeless tobacco: None  . Alcohol Use: None    Allergies  Allergen Reactions  . Codeine Itching and Anxiety    No current facility-administered medications for this encounter.   Current Outpatient Prescriptions  Medication Sig Dispense Refill  . Coenzyme Q10 (COQ-10) 200 MG CAPS Take 200 mg by mouth at bedtime.    . Ginkgo Biloba 120 MG CAPS Take 120 mg by mouth at bedtime.    . hydrochlorothiazide (HYDRODIURIL) 12.5 MG tablet  Take 12.5 mg by mouth daily.    Marland Kitchen lisinopril (PRINIVIL,ZESTRIL) 10 MG tablet Take 10 mg by mouth daily.    . Milk Thistle 175 MG CAPS Take 175 mg by mouth 2 (two) times a week. Pt takes on Saturday and Tuesday.    . Multiple Vitamin (MULTIVITAMIN WITH MINERALS) TABS tablet Take 1 tablet by mouth daily.    Marland Kitchen omega-3 acid ethyl esters (LOVAZA) 1 g capsule Take 1 g by mouth daily.    . Turmeric 500 MG CAPS Take 500 mg by mouth daily.       Review of Systems A Multi-point review of systems was asked and was negative except for the findings documented in the history of present illness  Physical Exam Blood pressure 180/67, pulse 59, temperature 98.4 F (36.9 C), temperature source Oral, resp. rate 17, height 5\' 3"  (1.6 m), weight 77.111 kg (170 lb), SpO2 96 %. CONSTITUTIONAL: No acute distress. EYES: Pupils are equal, round, and reactive to light, Sclera are non-icteric. EARS, NOSE, MOUTH AND THROAT: The oropharynx is clear. The oral mucosa is pink and moist. Hearing is intact to voice. LYMPH NODES:  Lymph nodes in the neck are normal. RESPIRATORY:  Lungs are clear. There is normal respiratory effort, with equal breath sounds bilaterally, and without pathologic use of accessory  muscles. CARDIOVASCULAR: Heart is regular without murmurs, gallops, or rubs. GI: The abdomen is large, soft, minimally tender to deep palpation left lower quadrant, and nondistended. There are no palpable masses. There is no hepatosplenomegaly. There are normal bowel sounds in all quadrants. GU: Rectal deferred.   MUSCULOSKELETAL: Normal muscle strength and tone. No cyanosis or edema.   SKIN: Turgor is good and there are no pathologic skin lesions or ulcers. NEUROLOGIC: Motor and sensation is grossly normal. Cranial nerves are grossly intact. PSYCH:  Oriented to person, place and time. Affect is normal.  Data Reviewed Images and labs reviewed. Labs show a leukocytosis of 15.2. Also mild elevation in bilirubin 1.5. The  remainder of her labs within normal limits. CT scan does show some insufflation of the sigmoid colon consistent with diverticulitis and with what looks like to be an intramural abscess in the middle of the diverticulitis. No free air or free fluid. I have personally reviewed the patient's imaging, laboratory findings and medical records.    Assessment    Diverticulitis    Plan    70 year old female with a copy of diverticulitis with an intramural abscess. Patient is totally nontoxic with a relatively benign exam. Discussed with her that treatment is usually his antibiotics at the does not resolve antibiotics been resulted requiring a removal of the diseased portion of the colon. She voiced understanding. Plan for admission, IV antibiotics, IV hydration, serial abdominal exams and labs. Patient voiced understanding and accepts admission.     Time spent with the patient was 60 minutes, with more than 50% of the time spent in face-to-face education, counseling and care coordination.     Clayburn Pert, MD FACS General Surgeon 08/30/2015, 9:15 PM

## 2015-08-31 DIAGNOSIS — K5720 Diverticulitis of large intestine with perforation and abscess without bleeding: Secondary | ICD-10-CM

## 2015-08-31 LAB — CBC
HEMATOCRIT: 44 % (ref 35.0–47.0)
HEMOGLOBIN: 14.8 g/dL (ref 12.0–16.0)
MCH: 31.9 pg (ref 26.0–34.0)
MCHC: 33.6 g/dL (ref 32.0–36.0)
MCV: 95 fL (ref 80.0–100.0)
Platelets: 205 10*3/uL (ref 150–440)
RBC: 4.63 MIL/uL (ref 3.80–5.20)
RDW: 13.5 % (ref 11.5–14.5)
WBC: 11.4 10*3/uL — ABNORMAL HIGH (ref 3.6–11.0)

## 2015-08-31 LAB — PROTIME-INR
INR: 1.11
PROTHROMBIN TIME: 14.5 s (ref 11.4–15.0)

## 2015-08-31 LAB — COMPREHENSIVE METABOLIC PANEL
ALK PHOS: 59 U/L (ref 38–126)
ALT: 28 U/L (ref 14–54)
AST: 20 U/L (ref 15–41)
Albumin: 3.5 g/dL (ref 3.5–5.0)
Anion gap: 7 (ref 5–15)
BUN: 11 mg/dL (ref 6–20)
CALCIUM: 8.4 mg/dL — AB (ref 8.9–10.3)
CHLORIDE: 101 mmol/L (ref 101–111)
CO2: 24 mmol/L (ref 22–32)
CREATININE: 0.64 mg/dL (ref 0.44–1.00)
Glucose, Bld: 104 mg/dL — ABNORMAL HIGH (ref 65–99)
Potassium: 3.6 mmol/L (ref 3.5–5.1)
Sodium: 132 mmol/L — ABNORMAL LOW (ref 135–145)
Total Bilirubin: 2 mg/dL — ABNORMAL HIGH (ref 0.3–1.2)
Total Protein: 7.2 g/dL (ref 6.5–8.1)

## 2015-08-31 LAB — APTT: aPTT: 25 seconds (ref 24–36)

## 2015-08-31 LAB — MAGNESIUM: MAGNESIUM: 2.2 mg/dL (ref 1.7–2.4)

## 2015-08-31 LAB — PHOSPHORUS: PHOSPHORUS: 3.8 mg/dL (ref 2.5–4.6)

## 2015-08-31 NOTE — Progress Notes (Signed)
CC: Diverticulitis Subjective: Feeling better. Minimal abdominal pain. AV SS Hungry  Objective: Vital signs in last 24 hours: Temp:  [97.6 F (36.4 C)-98.4 F (36.9 C)] 97.8 F (36.6 C) (04/08 0852) Pulse Rate:  [51-68] 51 (04/08 0852) Resp:  [16-18] 16 (04/08 0852) BP: (151-185)/(67-87) 173/69 mmHg (04/08 0852) SpO2:  [95 %-100 %] 99 % (04/08 0852) Weight:  [77.111 kg (170 lb)] 77.111 kg (170 lb) (04/07 1353) Last BM Date: 08/30/15  Intake/Output from previous day: 04/07 0701 - 04/08 0700 In: -  Out: 950 [Urine:950] Intake/Output this shift:    Physical exam: No acute distress awake alert Abdomen: Soft, nontender no peritonitis Ext: Well-perfused no edema  Lab Results: CBC   Recent Labs  08/30/15 1355 08/31/15 0530  WBC 15.2* 11.4*  HGB 15.9 14.8  HCT 46.0 44.0  PLT 218 205   BMET  Recent Labs  08/30/15 1355 08/31/15 0530  NA 136 132*  K 3.9 3.6  CL 103 101  CO2 25 24  GLUCOSE 99 104*  BUN 16 11  CREATININE 0.63 0.64  CALCIUM 9.1 8.4*   PT/INR  Recent Labs  08/30/15 1355 08/31/15 0530  LABPROT 13.4 14.5  INR 1.00 1.11   ABG No results for input(s): PHART, HCO3 in the last 72 hours.  Invalid input(s): PCO2, PO2  Studies/Results: Ct Abdomen Pelvis W Contrast  08/30/2015  CLINICAL DATA:  Pain in the right and left lower quadrants. Black stools. EXAM: CT ABDOMEN AND PELVIS WITH CONTRAST TECHNIQUE: Multidetector CT imaging of the abdomen and pelvis was performed using the standard protocol following bolus administration of intravenous contrast. CONTRAST:  135mL ISOVUE-300 IOPAMIDOL (ISOVUE-300) INJECTION 61% COMPARISON:  December 26, 2011 FINDINGS: Normal lung bases. No free air. Colonic diverticuli are seen, most prominent in the sigmoid colon. There is pericolonic stranding in the region of the sigmoid colon. There is also associated wall thickening. There is a fluid collection centered in the wall of the sigmoid colon measuring 2.4 by 1.0 by 2.6 cm  on axial image 64 and coronal image 73 consistent with an intramural abscess. There is an no definitive extraluminal gas. The remainder of the colon is normal. The appendix is normal as well with no appendicitis. Hepatic steatosis is identified. The liver, gallbladder, spleen, adrenal glands, and pancreas are within normal limits. A renal cyst is seen off the lateral mid right kidney. A 6 mm stone is seen in the mid left kidney. No suspicious masses or hydronephrosis. No ureterectasis or ureteral stones. Atherosclerotic changes seen in a non aneurysmal aorta. No adenopathy. There is a small hiatal hernia. The stomach and small bowel are otherwise normal. No adenopathy or mass in the pelvis. The bladder is normal. The uterus and adnexae are unremarkable. The visualize the bones demonstrate degenerative changes with no other acute abnormalities. Delayed images through the upper abdomen demonstrate no filling defects in the opacified portions of the renal collecting systems. IMPRESSION: 1. Sigmoid diverticulitis with an associated intramural abscess in the wall of the sigmoid colon as described above. Electronically Signed   By: Dorise Bullion III M.D   On: 08/30/2015 17:56    Anti-infectives: Anti-infectives    Start     Dose/Rate Route Frequency Ordered Stop   08/30/15 2300  piperacillin-tazobactam (ZOSYN) IVPB 3.375 g     3.375 g 12.5 mL/hr over 240 Minutes Intravenous 3 times per day 08/30/15 2256     08/30/15 1815  piperacillin-tazobactam (ZOSYN) IVPB 3.375 g     3.375 g 100  mL/hr over 30 Minutes Intravenous  Once 08/30/15 1813 08/30/15 1913      Assessment/Plan:Acute diverticulitis with intramural abscess. No need for surgical intervention or percutaneous drainage.  Continue antibiotics. We will advance to clear liquid diet DC in 24-48 hours if condition improves  Caroleen Hamman, MD, [facility]  08/31/2015

## 2015-09-01 LAB — CBC
HEMATOCRIT: 43.4 % (ref 35.0–47.0)
HEMOGLOBIN: 14.8 g/dL (ref 12.0–16.0)
MCH: 32.5 pg (ref 26.0–34.0)
MCHC: 34.1 g/dL (ref 32.0–36.0)
MCV: 95.4 fL (ref 80.0–100.0)
Platelets: 178 10*3/uL (ref 150–440)
RBC: 4.56 MIL/uL (ref 3.80–5.20)
RDW: 13.6 % (ref 11.5–14.5)
WBC: 8.2 10*3/uL (ref 3.6–11.0)

## 2015-09-01 LAB — BASIC METABOLIC PANEL
ANION GAP: 4 — AB (ref 5–15)
BUN: 11 mg/dL (ref 6–20)
CALCIUM: 8.5 mg/dL — AB (ref 8.9–10.3)
CO2: 27 mmol/L (ref 22–32)
CREATININE: 0.65 mg/dL (ref 0.44–1.00)
Chloride: 107 mmol/L (ref 101–111)
Glucose, Bld: 105 mg/dL — ABNORMAL HIGH (ref 65–99)
Potassium: 3.6 mmol/L (ref 3.5–5.1)
SODIUM: 138 mmol/L (ref 135–145)

## 2015-09-01 MED ORDER — CIPROFLOXACIN HCL 500 MG PO TABS
500.0000 mg | ORAL_TABLET | Freq: Two times a day (BID) | ORAL | Status: DC
  Administered 2015-09-01: 500 mg via ORAL
  Filled 2015-09-01: qty 1

## 2015-09-01 MED ORDER — PNEUMOCOCCAL VAC POLYVALENT 25 MCG/0.5ML IJ INJ: 0.5000 mL | INJECTION | INTRAMUSCULAR | Status: DC

## 2015-09-01 MED ORDER — METRONIDAZOLE 500 MG PO TABS
500.0000 mg | ORAL_TABLET | Freq: Three times a day (TID) | ORAL | Status: DC
  Administered 2015-09-01: 500 mg via ORAL
  Filled 2015-09-01: qty 1

## 2015-09-01 MED ORDER — METRONIDAZOLE 500 MG PO TABS: 500.0000 mg | ORAL_TABLET | Freq: Three times a day (TID) | ORAL | Status: DC

## 2015-09-01 MED ORDER — CIPROFLOXACIN HCL 500 MG PO TABS: 500.0000 mg | ORAL_TABLET | Freq: Two times a day (BID) | ORAL | Status: DC

## 2015-09-01 NOTE — Discharge Summary (Signed)
Patient ID: Nicole Tucker MRN: [id number] DOB/AGE: 06-21-1945 70 y.o.  Admit date: 08/30/2015 Discharge date: 09/01/2015   Discharge Diagnoses:  Active Problems:   Diverticulitis   Procedures:none  Hospital Course: 70 year old with abdominal pain and increasing white count with CT findings consistent with acute diverticulitis with mural abscess. Patient has had recurrent episodes and this is the third one. She did have a colonoscopy about 4 years ago and polyps were found. She was admitted to the hospital placed nothing by mouth and her diet was slowly advanced. IV Zosyn was started with good response. During has sterilization her condition improved her pain subsided and her white count went down. At the time of discharge she was ambulating, tolerating diet, her abdomen was soft nontender no peritonitis. I counseled her in detail about the diverticulitis and since is recurrent steal relatively young and had this time an episode requiring hospitalization and associated with abscess that I would lean toward recommending an elective sigmoid colectomy. We'll discuss this further in my office in a couple weeks. And she will need a repeat colonoscopy. Condition of patient at time of discharge stabl  Disposition: Home   Discharge Instructions    Call MD for:  difficulty breathing, headache or visual disturbances    Complete by:  As directed      Call MD for:  extreme fatigue    Complete by:  As directed      Call MD for:  hives    Complete by:  As directed      Call MD for:  persistant dizziness or light-headedness    Complete by:  As directed      Call MD for:  persistant nausea and vomiting    Complete by:  As directed      Call MD for:  severe uncontrolled pain    Complete by:  As directed      Call MD for:  temperature >100.4    Complete by:  As directed      Diet - low sodium heart healthy    Complete by:  As directed      Discharge instructions    Complete by:  As directed   Low  residue diet. Needs colonoscopy in 6 weeks     Increase activity slowly    Complete by:  As directed             Medication List    TAKE these medications        ciprofloxacin 500 MG tablet  Commonly known as:  CIPRO  Take 1 tablet (500 mg total) by mouth 2 (two) times daily.     CoQ-10 200 MG Caps  Take 200 mg by mouth at bedtime.     Ginkgo Biloba 120 MG Caps  Take 120 mg by mouth at bedtime.     hydrochlorothiazide 12.5 MG tablet  Commonly known as:  HYDRODIURIL  Take 12.5 mg by mouth daily.     lisinopril 10 MG tablet  Commonly known as:  PRINIVIL,ZESTRIL  Take 10 mg by mouth daily.     metroNIDAZOLE 500 MG tablet  Commonly known as:  FLAGYL  Take 1 tablet (500 mg total) by mouth every 8 (eight) hours.     Milk Thistle 175 MG Caps  Take 175 mg by mouth 2 (two) times a week. Pt takes on Saturday and Tuesday.     multivitamin with minerals Tabs tablet  Take 1 tablet by mouth daily.     omega-3 acid ethyl  esters 1 g capsule  Commonly known as:  LOVAZA  Take 1 g by mouth daily.     Turmeric 500 MG Caps  Take 500 mg by mouth daily.           Follow-up Information    Follow up with Jules Husbands, MD In 2 weeks.   Specialty:  General Surgery   Contact information:   [address] [place] [address] [phone]        Caroleen Hamman, MD FACS

## 2015-09-10 ENCOUNTER — Ambulatory Visit (INDEPENDENT_AMBULATORY_CARE_PROVIDER_SITE_OTHER): Payer: Medicare Other | Admitting: Family Medicine

## 2015-09-10 ENCOUNTER — Encounter: Payer: Self-pay | Admitting: Family Medicine

## 2015-09-10 VITALS — BP 162/88 | HR 80 | Temp 98.7°F | Resp 16 | Ht 63.0 in | Wt 167.0 lb

## 2015-09-10 DIAGNOSIS — Z0000 Encounter for general adult medical examination without abnormal findings: Secondary | ICD-10-CM | POA: Diagnosis not present

## 2015-09-10 DIAGNOSIS — Z780 Asymptomatic menopausal state: Secondary | ICD-10-CM | POA: Diagnosis not present

## 2015-09-10 DIAGNOSIS — R7303 Prediabetes: Secondary | ICD-10-CM | POA: Diagnosis not present

## 2015-09-10 DIAGNOSIS — Z1231 Encounter for screening mammogram for malignant neoplasm of breast: Secondary | ICD-10-CM

## 2015-09-10 DIAGNOSIS — E7800 Pure hypercholesterolemia, unspecified: Secondary | ICD-10-CM | POA: Diagnosis not present

## 2015-09-10 DIAGNOSIS — I10 Essential (primary) hypertension: Secondary | ICD-10-CM | POA: Diagnosis not present

## 2015-09-10 DIAGNOSIS — Z23 Encounter for immunization: Secondary | ICD-10-CM | POA: Diagnosis not present

## 2015-09-10 MED ORDER — LISINOPRIL 20 MG PO TABS: 20.0000 mg | ORAL_TABLET | Freq: Every day | ORAL | Status: DC

## 2015-09-10 NOTE — Progress Notes (Signed)
Patient ID: Nicole Tucker, female   DOB: January 31, 1946, 70 y.o.   MRN: [id number]       Patient: Nicole Tucker, Female    DOB: [birth year], 70 y.o.   MRN: [id number] Visit Date: 09/10/2015  Today's Provider: Margarita Rana, MD   Chief Complaint  Patient presents with  . Medicare Wellness  . Hypertension   Subjective:    Annual wellness visit Nicole Tucker is a 70 y.o. female. She feels well. She reports exercising active with daily activites. She reports she is sleeping well.  09/05/13 AWV 10/14/09 Pap-neg 09/18/14 Mammogram-BI-RADS 1 10/17/13 Colonoscopy-diverticulosis 05/16/12 BMD-osteopenia -----------------------------------------------------------   Hypertension, follow-up:  BP Readings from Last 3 Encounters:  09/10/15 162/88  09/01/15 153/68  07/12/14 132/80    She was last seen for hypertension 1 years ago.  BP at that visit was elevated. Management changes since that visit include no changes. She reports good compliance with treatment. She is not having side effects. She is not exercising. She is adherent to low salt diet.   Outside blood pressures are stable. She is experiencing none.  Patient denies chest pain.   Cardiovascular risk factors include advanced age (older than 73 for men, 22 for women).  Use of agents associated with hypertension: none.     Weight trend: stable Wt Readings from Last 3 Encounters:  09/10/15 167 lb (75.751 kg)  08/30/15 170 lb (77.111 kg)  07/12/14 173 lb (78.472 kg)    Current diet: in general, a "healthy" diet    ------------------------------------------------------------------------     Review of Systems  Constitutional: Positive for fatigue.  HENT: Negative.   Eyes: Negative.   Respiratory: Negative.   Cardiovascular: Negative.   Gastrointestinal: Negative.   Endocrine: Negative.   Genitourinary: Negative.   Musculoskeletal: Negative.   Skin: Negative.   Allergic/Immunologic: Negative.   Neurological:  Negative.   Hematological: Negative.   Psychiatric/Behavioral: Negative.     Social History   Social History  . Marital Status: Divorced    Spouse Name: N/A  . Number of Children: N/A  . Years of Education: N/A   Occupational History  . Not on file.   Social History Main Topics  . Smoking status: Former Research scientist (life sciences)  . Smokeless tobacco: Never Used  . Alcohol Use: No  . Drug Use: No  . Sexual Activity: Not on file   Other Topics Concern  . Not on file   Social History Narrative    Past Medical History  Diagnosis Date  . Arthritis   . Diverticular disease   . Hypertension      Patient Active Problem List   Diagnosis Date Noted  . Anxiety 07/03/2015  . Diverticulitis 07/03/2015  . Benign essential HTN 07/03/2015  . Colon polyp 07/03/2015  . Borderline diabetes 07/03/2015  . Hypercholesterolemia without hypertriglyceridemia 07/03/2015  . Calculus of kidney 10/23/2013  . Microscopic hematuria 10/23/2013  . Kidney lump 10/23/2013    Past Surgical History  Procedure Laterality Date  . Cataract extraction      Her family history includes Alcohol abuse in her father; Aneurysm in her mother; Bladder Cancer in her sister; Breast cancer in her sister; Diabetes in her paternal grandmother; Diverticulitis in her brother; Heart disease in her mother and paternal grandmother; Hypertension in her brother, mother, sister, sister, and sister.    Previous Medications   COENZYME Q10 (COQ-10) 200 MG CAPS    Take 200 mg by mouth at bedtime.   GINKGO BILOBA 120 MG CAPS    Take 120  mg by mouth at bedtime.   HYDROCHLOROTHIAZIDE (HYDRODIURIL) 25 MG TABLET    Take 25 mg by mouth daily.   LISINOPRIL (PRINIVIL,ZESTRIL) 10 MG TABLET    Take 10 mg by mouth daily.   MILK THISTLE 175 MG CAPS    Take 175 mg by mouth 2 (two) times a week. Pt takes on Saturday and Tuesday.   MULTIPLE VITAMIN (MULTIVITAMIN WITH MINERALS) TABS TABLET    Take 1 tablet by mouth daily.   OMEGA-3 ACID ETHYL ESTERS  (LOVAZA) 1 G CAPSULE    Take 1 g by mouth daily.   TURMERIC 500 MG CAPS    Take 500 mg by mouth daily.    Patient Care Team: Margarita Rana, MD as PCP - General (Family Medicine)     Objective:   Vitals: BP 162/88 mmHg  Pulse 80  Temp(Src) 98.7 F (37.1 C) (Oral)  Resp 16  Ht 5\' 3"  (1.6 m)  Wt 167 lb (75.751 kg)  BMI 29.59 kg/m2  Physical Exam  Constitutional: She is oriented to person, place, and time. She appears well-developed and well-nourished.  HENT:  Head: Normocephalic and atraumatic.  Right Ear: Tympanic membrane, external ear and ear canal normal.  Left Ear: Tympanic membrane, external ear and ear canal normal.  Nose: Nose normal.  Mouth/Throat: Uvula is midline, oropharynx is clear and moist and mucous membranes are normal.  Eyes: Conjunctivae, EOM and lids are normal. Pupils are equal, round, and reactive to light.  Neck: Trachea normal and normal range of motion. Neck supple. Carotid bruit is not present. No thyroid mass and no thyromegaly present.  Cardiovascular: Normal rate, regular rhythm and normal heart sounds.   Pulmonary/Chest: Effort normal and breath sounds normal.  Abdominal: Soft. Normal appearance and bowel sounds are normal. There is no hepatosplenomegaly. There is no tenderness.  Genitourinary: No breast swelling, tenderness or discharge.  Musculoskeletal: Normal range of motion.  Lymphadenopathy:    She has no cervical adenopathy.    She has no axillary adenopathy.  Neurological: She is alert and oriented to person, place, and time. She has normal strength. No cranial nerve deficit.  Skin: Skin is warm, dry and intact.  Psychiatric: She has a normal mood and affect. Her speech is normal and behavior is normal. Judgment and thought content normal. Cognition and memory are normal.    Activities of Daily Living In your present state of health, do you have any difficulty performing the following activities: 09/10/2015 08/30/2015  Hearing? N N  Vision?  N N  Difficulty concentrating or making decisions? N N  Walking or climbing stairs? N N  Dressing or bathing? N Y  Doing errands, shopping? N N    Fall Risk Assessment Fall Risk  09/10/2015 05/17/2015  Falls in the past year? No Yes  Number falls in past yr: - 1  Injury with Fall? - No     Depression Screen PHQ 2/9 Scores 09/10/2015  PHQ - 2 Score 1    Cognitive Testing - 6-CIT  Correct? Score   What year is it? yes 0 0 or 4  What month is it? yes 0 0 or 3  Memorize:    Pia Mau,  44,  [place],      What time is it? (within 1 hour) yes 0 0 or 3  Count backwards from 20 yes 0 0, 2, or 4  Name the months of the year yes 0 0, 2, or 4  Repeat name &  address above no 1 0, 2, 4, 6, 8, or 10       TOTAL SCORE  1/28   Interpretation:  Normal  Normal (0-7) Abnormal (8-28)       Assessment & Plan:     Annual Wellness Visit  Reviewed patient's Family Medical History Reviewed and updated list of patient's medical providers Assessment of cognitive impairment was done Assessed patient's functional ability Established a written schedule for health screening [place] Completed and Reviewed  Exercise Activities and Dietary recommendations Goals    None      Immunization History  Administered Date(s) Administered  . Tdap 10/14/2009        1. Medicare annual wellness visit, subsequent As above. Patient advised to continue eating healthy and exercise daily.  2. Need for pneumococcal vaccination - Pneumococcal conjugate vaccine 13-valent IM  3. Encounter for screening mammogram for breast cancer - MM DIGITAL SCREENING BILATERAL; Future  4. Postmenopausal - DG Bone Density; Future  5. Benign essential HTN Not at goal. Patient started on lisinopril 20 mg as below. Patient advised to discontinue lisinopril 10 mg. - lisinopril (PRINIVIL,ZESTRIL) 20 MG tablet; Take 1 tablet (20 mg total) by mouth daily.  Dispense: 90 tablet;  Refill: 3 - TSH  6. Hypercholesterolemia without hypertriglyceridemia - Lipid Panel With LDL/HDL Ratio  7. Borderline diabetes - Hemoglobin A1c     Patient seen and examined by Dr. Jerrell Belfast, and note scribed by Philbert Riser. Dimas, CMA.  I have reviewed the document for accuracy and completeness and I agree with above. Jerrell Belfast, MD   Margarita Rana, MD   ------------------------------------------------------------------------------------------------------------

## 2015-09-11 LAB — HEMOGLOBIN A1C
ESTIMATED AVERAGE GLUCOSE: 143 mg/dL
Hgb A1c MFr Bld: 6.6 % — ABNORMAL HIGH (ref 4.8–5.6)

## 2015-09-11 LAB — LIPID PANEL WITH LDL/HDL RATIO
CHOLESTEROL TOTAL: 185 mg/dL (ref 100–199)
HDL: 52 mg/dL (ref >39)
LDL Calculated: 114 mg/dL — ABNORMAL HIGH (ref 0–99)
LDl/HDL Ratio: 2.2 ratio units (ref 0.0–3.2)
TRIGLYCERIDES: 96 mg/dL (ref 0–149)
VLDL CHOLESTEROL CAL: 19 mg/dL (ref 5–40)

## 2015-09-11 LAB — TSH: TSH: 1.53 u[IU]/mL (ref 0.450–4.500)

## 2015-09-13 ENCOUNTER — Telehealth: Payer: Self-pay

## 2015-09-13 NOTE — Telephone Encounter (Signed)
-----   Message from Margarita Rana, MD sent at 09/12/2015  5:13 PM EDT ----- Blood sugar in diabetic range. OV  To address treatment. Thanks.

## 2015-09-13 NOTE — Telephone Encounter (Signed)
Pt advised.   Thanks,   -Laura  

## 2015-09-14 ENCOUNTER — Other Ambulatory Visit: Payer: Self-pay | Admitting: Family Medicine

## 2015-09-14 DIAGNOSIS — I10 Essential (primary) hypertension: Secondary | ICD-10-CM

## 2015-09-16 NOTE — Telephone Encounter (Signed)
Pt called needing refill on her hydrochlorothiazide (HYDRODIURIL) 25 MG tablet  She needs a local RX called into [address] .  She also needs it sent to [facility] 90 day mail order.    She is going out of town Wednesday and is almost out  Her call back is [phone]  Thanks teri

## 2015-09-17 ENCOUNTER — Ambulatory Visit (INDEPENDENT_AMBULATORY_CARE_PROVIDER_SITE_OTHER): Payer: Medicare Other | Admitting: Surgery

## 2015-09-17 ENCOUNTER — Encounter: Payer: Self-pay | Admitting: Surgery

## 2015-09-17 ENCOUNTER — Other Ambulatory Visit: Payer: Self-pay

## 2015-09-17 ENCOUNTER — Other Ambulatory Visit: Payer: Self-pay | Admitting: Family Medicine

## 2015-09-17 VITALS — BP 157/72 | HR 58 | Temp 97.8°F | Ht 63.0 in | Wt 166.0 lb

## 2015-09-17 DIAGNOSIS — K5732 Diverticulitis of large intestine without perforation or abscess without bleeding: Secondary | ICD-10-CM

## 2015-09-17 DIAGNOSIS — I10 Essential (primary) hypertension: Secondary | ICD-10-CM

## 2015-09-17 MED ORDER — HYDROCHLOROTHIAZIDE 25 MG PO TABS: 25.0000 mg | ORAL_TABLET | Freq: Every day | ORAL | Status: DC

## 2015-09-17 NOTE — Telephone Encounter (Signed)
Pt needs hydrochlorothiazide (HYDRODIURIL) 25 MG tablet called into the local pharmacy .  [address].  She has to have at least 10 pills called in locally. She is out and is going out of town.  Thanks, [employer]

## 2015-09-17 NOTE — Patient Instructions (Signed)
You have been scheduled for a Colonoscopy at [facility] surgery center on May 15,2017. Please see attached paperwork for further instructions. Please follow up with Dr. Dahlia Byes as scheduled below.

## 2015-09-17 NOTE — Progress Notes (Signed)
Nicole Tucker is a very nice 70 year old female seen for recurrent diverticulitis and abscess. Most recent episode was 3 weeks ago requiring hospitalization with IV antibiotics. She is doing very well. She is taking regular diet and his pain free. Last colonoscopy was 4-5 years ago and she was told that she was due for another one.  PE NAD Abd: soft, NT no peritonitis   A/P recurrent diverticulitis with a previous abscess discussed with the patient in detail about current recommendation of continuation of medical management versus sigmoid colectomy. And was present for now is to make sure that she completes her colonoscopy and to assess for any potential malignancy. I discussed with her about what a sigmoid colectomy will entail. Risk, benefits and possible complications of the surgery. She wishes to think about it and I will go ahead and get her colonoscopy and then come back with results and hopefully after she makes up her mind regarding echo versus surgical management of complicated diverticulitis. Extensive counseling provided.

## 2015-09-24 ENCOUNTER — Ambulatory Visit: Payer: Self-pay | Admitting: Family Medicine

## 2015-09-30 ENCOUNTER — Telehealth: Payer: Self-pay | Admitting: Surgery

## 2015-09-30 DIAGNOSIS — Z1211 Encounter for screening for malignant neoplasm of colon: Secondary | ICD-10-CM

## 2015-09-30 MED ORDER — NA SULFATE-K SULFATE-MG SULF 17.5-3.13-1.6 GM/177ML PO SOLN: 1.0000 | ORAL | Status: DC

## 2015-09-30 NOTE — Telephone Encounter (Signed)
Called patient to let her know i have sent the Suprep kit to her pharmacy.

## 2015-09-30 NOTE — Telephone Encounter (Signed)
Patient has called and stated that Nicole Tucker on [place] has not been sent her prescription for her colonoscopy prep. She would like for this to be done today so that she may pick this up tomorrow on her day off. Please advise patient once this is completed.

## 2015-10-01 ENCOUNTER — Encounter: Payer: Self-pay | Admitting: *Deleted

## 2015-10-03 NOTE — Discharge Instructions (Signed)

## 2015-10-07 ENCOUNTER — Telehealth: Payer: Self-pay

## 2015-10-07 ENCOUNTER — Ambulatory Visit: Payer: Medicare Other | Admitting: Anesthesiology

## 2015-10-07 ENCOUNTER — Encounter
Admission: RE | Disposition: A | Discharge status: Home / Self Care | Payer: Self-pay | Source: Ambulatory Visit | Attending: Gastroenterology

## 2015-10-07 ENCOUNTER — Ambulatory Visit
Admission: RE | Admit: 2015-10-07 | Discharge: 2015-10-07 | Disposition: A | Discharge status: Home / Self Care | Payer: Medicare Other | Source: Ambulatory Visit | Attending: Gastroenterology | Admitting: Gastroenterology

## 2015-10-07 DIAGNOSIS — E119 Type 2 diabetes mellitus without complications: Secondary | ICD-10-CM | POA: Insufficient documentation

## 2015-10-07 DIAGNOSIS — Z833 Family history of diabetes mellitus: Secondary | ICD-10-CM | POA: Insufficient documentation

## 2015-10-07 DIAGNOSIS — Z8052 Family history of malignant neoplasm of bladder: Secondary | ICD-10-CM | POA: Insufficient documentation

## 2015-10-07 DIAGNOSIS — M1990 Unspecified osteoarthritis, unspecified site: Secondary | ICD-10-CM | POA: Insufficient documentation

## 2015-10-07 DIAGNOSIS — I10 Essential (primary) hypertension: Secondary | ICD-10-CM | POA: Diagnosis not present

## 2015-10-07 DIAGNOSIS — Z8342 Family history of familial hypercholesterolemia: Secondary | ICD-10-CM | POA: Insufficient documentation

## 2015-10-07 DIAGNOSIS — K5730 Diverticulosis of large intestine without perforation or abscess without bleeding: Secondary | ICD-10-CM | POA: Insufficient documentation

## 2015-10-07 DIAGNOSIS — Z811 Family history of alcohol abuse and dependence: Secondary | ICD-10-CM | POA: Insufficient documentation

## 2015-10-07 DIAGNOSIS — Z87891 Personal history of nicotine dependence: Secondary | ICD-10-CM | POA: Insufficient documentation

## 2015-10-07 DIAGNOSIS — Z79899 Other long term (current) drug therapy: Secondary | ICD-10-CM | POA: Diagnosis not present

## 2015-10-07 DIAGNOSIS — Z8249 Family history of ischemic heart disease and other diseases of the circulatory system: Secondary | ICD-10-CM | POA: Diagnosis not present

## 2015-10-07 DIAGNOSIS — K5669 Other intestinal obstruction: Secondary | ICD-10-CM

## 2015-10-07 DIAGNOSIS — Z885 Allergy status to narcotic agent status: Secondary | ICD-10-CM | POA: Insufficient documentation

## 2015-10-07 DIAGNOSIS — Z9849 Cataract extraction status, unspecified eye: Secondary | ICD-10-CM | POA: Insufficient documentation

## 2015-10-07 DIAGNOSIS — K5732 Diverticulitis of large intestine without perforation or abscess without bleeding: Secondary | ICD-10-CM | POA: Insufficient documentation

## 2015-10-07 DIAGNOSIS — Z803 Family history of malignant neoplasm of breast: Secondary | ICD-10-CM | POA: Diagnosis not present

## 2015-10-07 DIAGNOSIS — K56699 Other intestinal obstruction unspecified as to partial versus complete obstruction: Secondary | ICD-10-CM | POA: Insufficient documentation

## 2015-10-07 HISTORY — DX: Type 2 diabetes mellitus without complications: E11.9

## 2015-10-07 HISTORY — PX: COLONOSCOPY: SHX5424

## 2015-10-07 SURGERY — COLONOSCOPY
Anesthesia: Monitor Anesthesia Care

## 2015-10-07 MED ORDER — STERILE WATER FOR IRRIGATION IR SOLN
Status: DC | PRN
  Administered 2015-10-07: 09:00:00

## 2015-10-07 MED ORDER — PROPOFOL 10 MG/ML IV BOLUS
INTRAVENOUS | Status: DC | PRN
  Administered 2015-10-07 (×5): 25 mg via INTRAVENOUS
  Administered 2015-10-07: 100 mg via INTRAVENOUS
  Administered 2015-10-07: 25 mg via INTRAVENOUS

## 2015-10-07 MED ORDER — LACTATED RINGERS IV SOLN
INTRAVENOUS | Status: DC
  Administered 2015-10-07: 09:00:00 via INTRAVENOUS

## 2015-10-07 MED ORDER — LIDOCAINE HCL (CARDIAC) 20 MG/ML IV SOLN
INTRAVENOUS | Status: DC | PRN
  Administered 2015-10-07: 40 mg via INTRAVENOUS

## 2015-10-07 SURGICAL SUPPLY — 22 items
CANISTER SUCT 1200ML W/VALVE (MISCELLANEOUS) ×3 IMPLANT
CLIP HMST 235XBRD CATH ROT (MISCELLANEOUS) IMPLANT
CLIP RESOLUTION 360 11X235 (MISCELLANEOUS)
FCP ESCP3.2XJMB 240X2.8X (MISCELLANEOUS)
FORCEPS BIOP RAD 4 LRG CAP 4 (CUTTING FORCEPS) IMPLANT
FORCEPS BIOP RJ4 240 W/NDL (MISCELLANEOUS)
FORCEPS ESCP3.2XJMB 240X2.8X (MISCELLANEOUS) IMPLANT
GOWN CVR UNV OPN BCK APRN NK (MISCELLANEOUS) ×2 IMPLANT
GOWN ISOL THUMB LOOP REG UNIV (MISCELLANEOUS) ×6
INJECTOR VARIJECT VIN23 (MISCELLANEOUS) IMPLANT
KIT DEFENDO VALVE AND CONN (KITS) IMPLANT
KIT ENDO PROCEDURE OLY (KITS) ×3 IMPLANT
MARKER SPOT ENDO TATTOO 5ML (MISCELLANEOUS) IMPLANT
PAD GROUND ADULT SPLIT (MISCELLANEOUS) IMPLANT
PROBE APC STR FIRE (PROBE) IMPLANT
SNARE SHORT THROW 13M SML OVAL (MISCELLANEOUS) IMPLANT
SNARE SHORT THROW 30M LRG OVAL (MISCELLANEOUS) IMPLANT
SNARE SNG USE RND 15MM (INSTRUMENTS) IMPLANT
SPOT EX ENDOSCOPIC TATTOO (MISCELLANEOUS)
TRAP ETRAP POLY (MISCELLANEOUS) IMPLANT
VARIJECT INJECTOR VIN23 (MISCELLANEOUS)
WATER STERILE IRR 250ML POUR (IV SOLUTION) ×3 IMPLANT

## 2015-10-07 NOTE — Anesthesia Postprocedure Evaluation (Signed)
Anesthesia Post Note  Patient: Nicole Tucker  Procedure(s) Performed: Procedure(s) (LRB): COLONOSCOPY (N/A)  Patient location during evaluation: PACU Anesthesia Type: MAC Level of consciousness: awake and alert Pain management: pain level controlled Vital Signs Assessment: post-procedure vital signs reviewed and stable Respiratory status: spontaneous breathing, nonlabored ventilation, respiratory function stable and patient connected to nasal cannula oxygen Cardiovascular status: stable and blood pressure returned to baseline Anesthetic complications: no    Kapri Nero C

## 2015-10-07 NOTE — Telephone Encounter (Signed)
Patient called in to verify appointment scheduled for this week. She also stated that she just had a Colonoscopy with Dr. Allen Norris this morning and would like some antibiotics as he told her that this area was still infected.  Spoke with Ginger, Dr. Dorothey Baseman nurse and after reviewing the chart it is determined that there is extensive diverticulosis noted in multiple areas of the colon but patient does not have any inflammation that needed biopsied and therefore does not need to be placed on antibiotics.  Patient to follow-up with Dr. Dahlia Byes on Thursday 10/10/15 as scheduled.

## 2015-10-07 NOTE — Telephone Encounter (Signed)
Returned phone call to patient at this time. Explained the information below.  Patient verbalized understanding of this information.

## 2015-10-07 NOTE — Anesthesia Procedure Notes (Signed)
Procedure Name: MAC Performed by: Amira Podolak Pre-anesthesia Checklist: Patient identified, Emergency Drugs available, Suction available, Patient being monitored and Timeout performed Patient Re-evaluated:Patient Re-evaluated prior to inductionOxygen Delivery Method: Nasal cannula       

## 2015-10-07 NOTE — Anesthesia Preprocedure Evaluation (Signed)
Anesthesia Evaluation  Patient identified by MRN, date of birth, ID band Patient awake    Reviewed: Allergy & Precautions, NPO status , Patient's Chart, lab work & pertinent test results  Airway Mallampati: II  TM Distance: >3 FB Neck ROM: Full    Dental no notable dental hx.    Pulmonary neg pulmonary ROS, former smoker,    Pulmonary exam normal breath sounds clear to auscultation       Cardiovascular hypertension, Normal cardiovascular exam Rhythm:Regular Rate:Normal     Neuro/Psych negative neurological ROS  negative psych ROS   GI/Hepatic Neg liver ROS, Diverticular disease   Endo/Other  negative endocrine ROSdiabetes, Type 2  Renal/GU negative Renal ROS  negative genitourinary   Musculoskeletal  (+) Arthritis ,   Abdominal   Peds negative pediatric ROS (+)  Hematology negative hematology ROS (+)   Anesthesia Other Findings   Reproductive/Obstetrics negative OB ROS                             Anesthesia Physical Anesthesia Plan  ASA: II  Anesthesia Plan: MAC   Post-op Pain Management:    Induction: Intravenous  Airway Management Planned:   Additional Equipment:   Intra-op Plan:   Post-operative Plan: Extubation in OR  Informed Consent: I have reviewed the patients History and Physical, chart, labs and discussed the procedure including the risks, benefits and alternatives for the proposed anesthesia with the patient or authorized representative who has indicated his/her understanding and acceptance.   Dental advisory given  Plan Discussed with: CRNA  Anesthesia Plan Comments:         Anesthesia Quick Evaluation

## 2015-10-07 NOTE — H&P (Signed)
[facility]  [address], [place], [place] [address] Phone: [phone] Fax : [phone]  Primary Care Physician:  Margarita Rana, MD Primary Gastroenterologist:  Dr. Allen Norris  Pre-Procedure History & Physical: HPI:  Nicole Tucker is a 70 y.o. female is here for an colonoscopy.   Past Medical History  Diagnosis Date  . Arthritis   . Diverticular disease   . Hypertension   . Diabetes mellitus without complication ([place])     New DX - no meds yet    Past Surgical History  Procedure Laterality Date  . Cataract extraction      Prior to Admission medications   Medication Sig Start Date End Date Taking? Authorizing Provider  Coenzyme Q10 (COQ-10) 200 MG CAPS Take 200 mg by mouth at bedtime.   Yes Historical Provider, MD  Ginkgo Biloba 120 MG CAPS Take 120 mg by mouth at bedtime.   Yes Historical Provider, MD  hydrochlorothiazide (HYDRODIURIL) 25 MG tablet Take 1 tablet (25 mg total) by mouth daily. 09/17/15  Yes Margarita Rana, MD  lisinopril (PRINIVIL,ZESTRIL) 20 MG tablet Take 1 tablet (20 mg total) by mouth daily. 09/10/15  Yes Margarita Rana, MD  Milk Thistle 175 MG CAPS Take 175 mg by mouth 2 (two) times a week. Pt takes on Saturday and Tuesday.   Yes Historical Provider, MD  Multiple Vitamin (MULTIVITAMIN WITH MINERALS) TABS tablet Take 1 tablet by mouth daily.   Yes Historical Provider, MD  Na Sulfate-K Sulfate-Mg Sulf SOLN Take 1 kit by mouth 1 day or 1 dose. Please take one bottle at 5:00pm the day before procedure and take second bottle four hours prior to procedure. 09/30/15  Yes Lucilla Lame, MD  omega-3 acid ethyl esters (LOVAZA) 1 g capsule Take 1 g by mouth daily.   Yes Historical Provider, MD  Turmeric 500 MG CAPS Take 500 mg by mouth daily.   Yes Historical Provider, MD    Allergies as of 09/17/2015 - Review Complete 09/17/2015  Allergen Reaction Noted  . Codeine Itching and Anxiety 07/03/2015    Family History  Problem Relation Age of Onset  . Heart  disease Mother   . Hypertension Mother   . Aneurysm Mother   . Alcohol abuse Father   . Bladder Cancer Sister   . Hypertension Sister   . Diabetes Paternal Grandmother   . Heart disease Paternal Grandmother   . Breast cancer Sister   . Hypertension Sister   . Diverticulitis Brother   . Hypertension Brother   . Hypertension Sister     Social History   Social History  . Marital Status: Divorced    Spouse Name: N/A  . Number of Children: N/A  . Years of Education: N/A   Occupational History  . Not on file.   Social History Main Topics  . Smoking status: Former Research scientist (life sciences)  . Smokeless tobacco: Never Used     Comment: quit 1992  . Alcohol Use: No  . Drug Use: No  . Sexual Activity: Not on file   Other Topics Concern  . Not on file   Social History Narrative    Review of Systems: See HPI, otherwise negative ROS  Physical Exam: BP 169/60 mmHg  Pulse 54  Temp(Src) 98.1 F (36.7 C)  Resp 16  Ht _0  (1.6 m)  Wt 160 lb 12.8 oz (72.938 kg)  BMI 28.49 kg/m2  SpO2 99% General:   Alert,  pleasant and cooperative in NAD Head:  Normocephalic and atraumatic. Neck:  Supple; no masses or thyromegaly. Lungs:  Clear throughout to auscultation.    Heart:  Regular rate and rhythm. Abdomen:  Soft, nontender and nondistended. Normal bowel sounds, without guarding, and without rebound.   Neurologic:  Alert and  oriented x4;  grossly normal neurologically.  Impression/Plan: Nicole Tucker is here for an colonoscopy to be performed for diverticulitis  Risks, benefits, limitations, and alternatives regarding  colonoscopy have been reviewed with the patient.  Questions have been answered.  All parties agreeable.   Lucilla Lame, MD  10/07/2015, 8:46 AM

## 2015-10-07 NOTE — Transfer of Care (Signed)
Immediate Anesthesia Transfer of Care Note  Patient: Nicole Tucker  Procedure(s) Performed: Procedure(s): COLONOSCOPY (N/A)  Patient Location: PACU  Anesthesia Type: MAC  Level of Consciousness: awake, alert  and patient cooperative  Airway and Oxygen Therapy: Patient Spontanous Breathing and Patient connected to supplemental oxygen  Post-op Assessment: Post-op Vital signs reviewed, Patient's Cardiovascular Status Stable, Respiratory Function Stable, Patent Airway and No signs of Nausea or vomiting  Post-op Vital Signs: Reviewed and stable  Complications: No apparent anesthesia complications

## 2015-10-07 NOTE — Op Note (Signed)
[facility] Patient Name: Nicole Tucker Procedure Date: 10/07/2015 9:15 AM MRN: [id number] Account #: [id number] Date of Birth: 1946-01-21 Admit Type: Outpatient Age: 70 Room: [facility] OR ROOM 01 Gender: Female Note Status: Finalized Procedure:            Colonoscopy Indications:          Follow-up of diverticulitis Providers:            Lucilla Lame, MD Referring MD:         Jerrell Belfast, MD (Referring MD) Medicines:            Propofol per Anesthesia Complications:        No immediate complications. Procedure:            Pre-Anesthesia Assessment:                       - Prior to the procedure, a History and Physical was                        performed, and patient medications and allergies were                        reviewed. The patient's tolerance of previous                        anesthesia was also reviewed. The risks and benefits of                        the procedure and the sedation options and risks were                        discussed with the patient. All questions were                        answered, and informed consent was obtained. Prior                        Anticoagulants: The patient has taken no previous                        anticoagulant or antiplatelet agents. ASA Grade                        Assessment: II - A patient with mild systemic disease.                        After reviewing the risks and benefits, the patient was                        deemed in satisfactory condition to undergo the                        procedure.                       After obtaining informed consent, the colonoscope was                        passed under direct vision. Throughout the procedure,  the patient's blood pressure, pulse, and oxygen                        saturations were monitored continuously. The Olympus CF                        H180AL colonoscope (S#: [id number]) was introduced through          the anus and advanced to the the cecum, identified by                        appendiceal orifice and ileocecal valve. The                        colonoscopy was performed without difficulty. The                        patient tolerated the procedure well. The quality of                        the bowel preparation was excellent. Findings:      The perianal and digital rectal examinations were normal.      A benign-appearing, intrinsic moderate stenosis was found in the sigmoid       colon and was traversed.      Multiple small-mouthed diverticula were found in the sigmoid colon.      A few small-mouthed diverticula were found in the ascending colon.      Exudate was found in the sigmoid colon. Impression:           - Stricture in the sigmoid colon.                       - Diverticulosis in the sigmoid colon.                       - Diverticulosis in the ascending colon.                       - Exudate in the sigmoid colon.                       - No specimens collected. Recommendation:       - Return to referring physician. Procedure Code(s):    --- Professional ---                       [phone], Colonoscopy, flexible; diagnostic, including                        collection of specimen(s) by brushing or washing, when                        performed (separate procedure) Diagnosis Code(s):    --- Professional ---                       [id number], Diverticulitis of large intestine without                        perforation or abscess without bleeding  K56.69, Other intestinal obstruction                       K63.89, Other specified diseases of intestine CPT copyright 2016 American Medical Association. All rights reserved. The codes documented in this report are preliminary and upon coder review may  be revised to meet current compliance requirements. Lucilla Lame, MD 10/07/2015 9:38:52 AM This report has been signed electronically. Number of Addenda: 0 Note  Initiated On: 10/07/2015 9:15 AM Scope Withdrawal Time: 0 hours 5 minutes 49 seconds  Total Procedure Duration: 0 hours 9 minutes 45 seconds       [facility]

## 2015-10-08 ENCOUNTER — Ambulatory Visit (INDEPENDENT_AMBULATORY_CARE_PROVIDER_SITE_OTHER): Payer: Medicare Other | Admitting: Family Medicine

## 2015-10-08 ENCOUNTER — Ambulatory Visit: Payer: 59

## 2015-10-08 ENCOUNTER — Encounter: Payer: Self-pay | Admitting: Family Medicine

## 2015-10-08 VITALS — BP 160/86 | HR 56 | Temp 97.7°F | Resp 16 | Ht 63.0 in | Wt 165.0 lb

## 2015-10-08 DIAGNOSIS — E119 Type 2 diabetes mellitus without complications: Secondary | ICD-10-CM | POA: Diagnosis not present

## 2015-10-08 NOTE — Progress Notes (Signed)
Patient ID: Nicole Tucker, female   DOB: 1945-12-20, 70 y.o.   MRN: [id number]       Patient: Nicole Tucker Female    DOB: September 15, 1945   70 y.o.   MRN: [id number] Visit Date: 10/08/2015  Today's Provider: Margarita Rana, MD   Chief Complaint  Patient presents with  . Diabetes  . Hypertension   Subjective:    HPI  Diabetes Mellitus Type II, Follow-up:   Lab Results  Component Value Date   HGBA1C 6.6* 09/10/2015    Last seen for diabetes 1 months ago.  Management since then includes checking labs. She reports no oral medication. Current symptoms include none and have been stable. Home blood sugar records: fasting range: 83. Patient reports that she bought a glucose monitor to check blood sugar.  Episodes of hypoglycemia? no   Most Recent Eye Exam: with in a year Weight trend: stable Prior visit with dietician: no Current diet: in general, a "healthy" diet  . Patient reports that she started a low carb diet. Current exercise: no regular exercise, active with daily activites  Pertinent Labs:    Component Value Date/Time   CHOL 185 09/10/2015 1634   TRIG 96 09/10/2015 1634   HDL 52 09/10/2015 1634   LDLCALC 114* 09/10/2015 1634   CREATININE 0.65 09/01/2015 0446   CREATININE 0.8 08/10/2014   CREATININE 0.76 12/26/2011 1920    Wt Readings from Last 3 Encounters:  10/08/15 165 lb (74.844 kg)  10/07/15 160 lb 12.8 oz (72.938 kg)  09/17/15 166 lb (75.297 kg)   ------------------------------------------------------------------------   Hypertension, follow-up:  BP Readings from Last 3 Encounters:  10/08/15 160/86  10/07/15 169/60  09/17/15 157/72    She was last seen for hypertension 1 months ago.  BP at that visit was 169/60. Management changes since that visit include checking labs. She reports excellent compliance with treatment. She is not having side effects. She is not exercising. She is adherent to low salt diet.   Outside blood pressures are  stable. She is experiencing none.  Patient denies chest pain.   Cardiovascular risk factors include diabetes mellitus.  Use of agents associated with hypertension: none.     Weight trend: stable Wt Readings from Last 3 Encounters:  10/08/15 165 lb (74.844 kg)  10/07/15 160 lb 12.8 oz (72.938 kg)  09/17/15 166 lb (75.297 kg)   Current diet: in general, a "healthy" diet    ------------------------------------------------------------------------     Allergies  Allergen Reactions  . Codeine Itching and Anxiety   Previous Medications   COENZYME Q10 (COQ-10) 200 MG CAPS    Take 200 mg by mouth at bedtime.   GINKGO BILOBA 120 MG CAPS    Take 120 mg by mouth at bedtime.   HYDROCHLOROTHIAZIDE (HYDRODIURIL) 25 MG TABLET    Take 1 tablet (25 mg total) by mouth daily.   LISINOPRIL (PRINIVIL,ZESTRIL) 20 MG TABLET    Take 1 tablet (20 mg total) by mouth daily.   MILK THISTLE 175 MG CAPS    Take 175 mg by mouth 2 (two) times a week. Pt takes on Saturday and Tuesday.   MULTIPLE VITAMIN (MULTIVITAMIN WITH MINERALS) TABS TABLET    Take 1 tablet by mouth daily.   OMEGA-3 ACID ETHYL ESTERS (LOVAZA) 1 G CAPSULE    Take 1 g by mouth daily.   TURMERIC 500 MG CAPS    Take 500 mg by mouth daily.    Review of Systems  Constitutional: Negative.   Cardiovascular: Negative.  Endocrine: Negative.     Social History  Substance Use Topics  . Smoking status: Former Research scientist (life sciences)  . Smokeless tobacco: Never Used     Comment: quit 1992  . Alcohol Use: No   Objective:   BP 160/86 mmHg  Pulse 56  Temp(Src) 97.7 F (36.5 C) (Oral)  Resp 16  Ht 5\' 3"  (1.6 m)  Wt 165 lb (74.844 kg)  BMI 29.24 kg/m2  Physical Exam  Constitutional: She is oriented to person, place, and time. She appears well-developed and well-nourished.  Neurological: She is alert and oriented to person, place, and time.  Psychiatric: She has a normal mood and affect. Her behavior is normal. Judgment and thought content normal.       Assessment & Plan:     1. Diabetes mellitus without complication ([place]) New diagnosis. Has already started checking her sugars daily.  Has read a lot about diabetes and has changed her diet  And is eating low carbohydrate diet.   Her am sugars are now 80. Patient very motivated to continue diet changes. She will also increase her exercise.  Recheck in 6 months with Tawanna Sat.      Patient seen and examined by Dr. Jerrell Belfast, and note scribed by Philbert Riser. Dimas, CMA.  I have reviewed the document for accuracy and completeness and I agree with above. - Jerrell Belfast, MD   Margarita Rana, MD  [facility]

## 2015-10-10 ENCOUNTER — Encounter: Payer: Self-pay | Admitting: Surgery

## 2015-10-10 ENCOUNTER — Ambulatory Visit (INDEPENDENT_AMBULATORY_CARE_PROVIDER_SITE_OTHER): Payer: Medicare Other | Admitting: Surgery

## 2015-10-10 VITALS — BP 168/76 | HR 46 | Temp 98.3°F | Wt 163.0 lb

## 2015-10-10 DIAGNOSIS — K5732 Diverticulitis of large intestine without perforation or abscess without bleeding: Secondary | ICD-10-CM | POA: Diagnosis not present

## 2015-10-10 NOTE — Progress Notes (Signed)
92 this 70 year old female following up for diverticulitis. She did have an episode that required hospitalization with IV antibiotics. She was found to have an intramural abscess. Recent colonoscopy showed diverticulosis and with evidence of a sigmoid stricture. Currently the patient is doing well and she is taking by mouth and having regular bowel movements. She is not obstructed. She reports some occasional intermittent mild abdominal pain.  ROS: Its otherwise negative  PE: NAD awake alert Abd: Soft nontender no peritonitis Ext: Well-perfused, warm Neurological: Awake alert normal gait no motor or sensory deficits  A/P patient with complicated diverticulitis now with a sigmoid stricture. I recommend an elective laparoscopic sigmoid colectomy given that she had recurrent episode of diverticulitis, abscess and now a stricture. Discussed with the patient in detail about the nature of her disease process. The proposed surgery, risk, benefits and possible complications including but not limited to: Leading, infection, leak need for ostomy and re-interventions. She understands and she agrees to proceed with colectomy but she wants to postpone the surgery until August. We'll bring her back for more preoperative counseling at the end of July.

## 2015-10-14 ENCOUNTER — Telehealth: Payer: Self-pay | Admitting: Surgery

## 2015-10-14 NOTE — Telephone Encounter (Signed)
LVM for patient to call office.

## 2015-10-14 NOTE — Telephone Encounter (Signed)
Patient would like advice. She saw Dr Dahlia Byes on 10/10/15 for diverticulitis of colon. Dr Dahlia Byes suggested surgery on June 23rd for patient, but she stated she wanted to wait. She is now calling back wanting to know if that is safe? Is she taking a rish waiting to have surgery? Please call and advise.

## 2015-10-15 ENCOUNTER — Ambulatory Visit
Admission: RE | Admit: 2015-10-15 | Discharge: 2015-10-15 | Disposition: A | Discharge status: Home / Self Care | Payer: Medicare Other | Source: Ambulatory Visit | Attending: Family Medicine | Admitting: Family Medicine

## 2015-10-15 ENCOUNTER — Other Ambulatory Visit: Payer: Self-pay | Admitting: Family Medicine

## 2015-10-15 ENCOUNTER — Telehealth: Payer: Self-pay

## 2015-10-15 DIAGNOSIS — Z1231 Encounter for screening mammogram for malignant neoplasm of breast: Secondary | ICD-10-CM | POA: Insufficient documentation

## 2015-10-15 DIAGNOSIS — Z780 Asymptomatic menopausal state: Secondary | ICD-10-CM | POA: Diagnosis not present

## 2015-10-15 DIAGNOSIS — M85852 Other specified disorders of bone density and structure, left thigh: Secondary | ICD-10-CM | POA: Diagnosis not present

## 2015-10-15 NOTE — Telephone Encounter (Signed)
Called patient back and told her that if she wanted to do her surgery on June 23rd, then we could schedule her. Patient wanted to know if she was needing to take antibiotic now. I told her that I would call her with a response once I hear back from Dr. Dahlia Byes. Patient agreed.  I will send Dr. Dahlia Byes a message and then I will contact patient with his response.

## 2015-10-15 NOTE — Telephone Encounter (Signed)
Called patient to let her know that Dr. Dahlia Byes will see her at the clinic back on 10/24/2015 and then he will do her surgery on 11/15/2015. Patient agreed.

## 2015-10-15 NOTE — Telephone Encounter (Signed)
Pt advised.   Thanks,   -Laura  

## 2015-10-15 NOTE — Telephone Encounter (Signed)
-----   Message from Margarita Rana, MD sent at 10/15/2015 10:04 AM EDT ----- Some bone thinning but not osteoporosis. Weight bearing exercise and recheck in 2 years. Thanks.

## 2015-10-24 ENCOUNTER — Ambulatory Visit (INDEPENDENT_AMBULATORY_CARE_PROVIDER_SITE_OTHER): Payer: Medicare Other | Admitting: Surgery

## 2015-10-24 ENCOUNTER — Encounter: Payer: Self-pay | Admitting: Surgery

## 2015-10-24 VITALS — BP 161/72 | HR 67 | Temp 97.9°F | Ht 63.0 in | Wt 162.0 lb

## 2015-10-24 DIAGNOSIS — K5732 Diverticulitis of large intestine without perforation or abscess without bleeding: Secondary | ICD-10-CM

## 2015-10-24 NOTE — Progress Notes (Signed)
Nicole Tucker is a 70 year old female following up for chronic diverticulitis. Recently has experienced more suprapubic abdominal pain as well as urinary symptoms. No fevers no chills. She is able to tolerate by mouth without any emesis. No evidence of of toxicity or sepsis. She has a recent colonoscopy showing evidence of chronic diverticulitis and evidence of a sigmoid stricture. She was recently hospitalized for diverticulitis with abscess that was treated medically. Discussed with patient in detail and at length about her disease process. Given the fact that she had an episode of complicated diverticulitis and now she has a sigmoid stricture I do recommend an elective sigmoid colectomy. Procedure discussed with the patient in detail. Risk, benefits, possible complications including but not limited to: Bleeding, infection, anastomotic leak, creation of a colostomy. Prolonged hospitalization and chronic pain. She understands and wishes to proceed extensive counseling provided spent about 25 minutes in this encounter with the majority of time alocated to counseling. At this time her abdomen is soft and no evidence of a need for acute surgical revision.  She wishes to proceed with elective surgery.

## 2015-10-25 ENCOUNTER — Telehealth: Payer: Self-pay | Admitting: Surgery

## 2015-10-25 NOTE — Telephone Encounter (Signed)
Pt advised of pre op date/time and sx date. Sx: 11/15/15 with Dr Pabon--Dr Azalee Course assisting--Laparoscopic sigmoid colectomy.  Pre op: 11/07/15 @ 9:00am--Office.   Patient made aware to call [phone], between 1-3:00pm the day before surgery, to find out what time to arrive.

## 2015-10-30 ENCOUNTER — Other Ambulatory Visit: Payer: Self-pay

## 2015-10-30 MED ORDER — BISACODYL EC 5 MG PO TBEC
DELAYED_RELEASE_TABLET | ORAL | Status: DC
Start: 2015-10-30 — End: 2015-11-28

## 2015-10-30 MED ORDER — NEOMYCIN SULFATE 500 MG PO TABS: 1000.0000 mg | ORAL_TABLET | Freq: Three times a day (TID) | ORAL | Status: DC

## 2015-10-30 MED ORDER — ERYTHROMYCIN BASE 500 MG PO TABS: 500.0000 mg | ORAL_TABLET | Freq: Four times a day (QID) | ORAL | Status: DC

## 2015-10-30 MED ORDER — POLYETHYLENE GLYCOL 3350 17 GM/SCOOP PO POWD: 1.0000 | Freq: Once | ORAL | Status: DC

## 2015-11-04 ENCOUNTER — Telehealth: Payer: Self-pay | Admitting: Surgery

## 2015-11-04 MED ORDER — ERYTHROMYCIN BASE 500 MG PO TABS: 500.0000 mg | ORAL_TABLET | Freq: Three times a day (TID) | ORAL | Status: DC

## 2015-11-04 NOTE — Telephone Encounter (Signed)
I spoke with patient and she was instructed to take Erythromicin 500 mg Three times daily and to take Neomycin 1000 mg three times daily the day before surgery.

## 2015-11-04 NOTE — Telephone Encounter (Signed)
Called patient to let her know that she is needing to take both antibiotics: Erythromycin 500 MG and Neomycin 500 MG, two tabs, three times daily the day before her surgery. Patient understood and had no further questions.

## 2015-11-04 NOTE — Telephone Encounter (Signed)
Needs to know what day to take her medication before surgery.  Erythromycin BS 500MG  Neomycin 500mg 

## 2015-11-07 ENCOUNTER — Ambulatory Visit
Admission: RE | Admit: 2015-11-07 | Discharge: 2015-11-07 | Disposition: A | Discharge status: Home / Self Care | Payer: Medicare Other | Source: Ambulatory Visit | Attending: Surgery | Admitting: Surgery

## 2015-11-07 DIAGNOSIS — Z01812 Encounter for preprocedural laboratory examination: Secondary | ICD-10-CM | POA: Diagnosis not present

## 2015-11-07 DIAGNOSIS — Z01810 Encounter for preprocedural cardiovascular examination: Secondary | ICD-10-CM | POA: Diagnosis not present

## 2015-11-07 DIAGNOSIS — I10 Essential (primary) hypertension: Secondary | ICD-10-CM | POA: Diagnosis not present

## 2015-11-07 LAB — BASIC METABOLIC PANEL
ANION GAP: 9 (ref 5–15)
BUN: 17 mg/dL (ref 6–20)
CALCIUM: 9.5 mg/dL (ref 8.9–10.3)
CO2: 30 mmol/L (ref 22–32)
CREATININE: 0.64 mg/dL (ref 0.44–1.00)
Chloride: 103 mmol/L (ref 101–111)
GFR calc Af Amer: >60 mL/min (ref >60)
GFR calc non Af Amer: >60 mL/min (ref >60)
GLUCOSE: 98 mg/dL (ref 65–99)
Potassium: 3.9 mmol/L (ref 3.5–5.1)
Sodium: 142 mmol/L (ref 135–145)

## 2015-11-07 LAB — CBC
HCT: 44.9 % (ref 35.0–47.0)
HEMOGLOBIN: 15.3 g/dL (ref 12.0–16.0)
MCH: 31.9 pg (ref 26.0–34.0)
MCHC: 34.1 g/dL (ref 32.0–36.0)
MCV: 93.6 fL (ref 80.0–100.0)
Platelets: 196 10*3/uL (ref 150–440)
RBC: 4.79 MIL/uL (ref 3.80–5.20)
RDW: 14.8 % — ABNORMAL HIGH (ref 11.5–14.5)
WBC: 7.6 10*3/uL (ref 3.6–11.0)

## 2015-11-07 LAB — SURGICAL PCR SCREEN
MRSA, PCR: NEGATIVE
Staphylococcus aureus: NEGATIVE

## 2015-11-07 NOTE — Pre-Procedure Instructions (Signed)
Reviewed patient medical history with Dr Ronelle Nigh. Labs and EKG pending.

## 2015-11-07 NOTE — Patient Instructions (Addendum)
Your procedure is scheduled on: Friday 11/15/15 Report to Day Surgery. 2ND FLOOR MEDICAL MALL ENTRANCE To find out your arrival time please call [phone] between 1PM - 3PM on Thursday 11/14/15.  Remember: Instructions that are not followed completely may result in serious medical risk, up to and including death, or upon the discretion of your surgeon and anesthesiologist your surgery may need to be rescheduled.    __X__ 1. Do not eat food or drink liquids after midnight. No gum chewing or hard candies.     __X__ 2. No Alcohol for 24 hours before or after surgery.   ____ 3. Bring all medications with you on the day of surgery if instructed.    __X__ 4. Notify your doctor if there is any change in your medical condition     (cold, fever, infections).     Do not wear jewelry, make-up, hairpins, clips or nail polish.  Do not wear lotions, powders, or perfumes.   Do not shave 48 hours prior to surgery. Men may shave face and neck.  Do not bring valuables to the hospital.    [facility] is not responsible for any belongings or valuables.               Contacts, dentures or bridgework may not be worn into surgery.  Leave your suitcase in the car. After surgery it may be brought to your room.  For patients admitted to the hospital, discharge time is determined by your                treatment team.   Patients discharged the day of surgery will not be allowed to drive home.   Please read over the following fact sheets that you were given:   MRSA Information and Surgical Site Infection Prevention   __X__ Take these medicines the morning of surgery with A SIP OF WATER:    1. LISINOPRIL  2.   3.   4.  5.  6.  ____ Fleet Enema (as directed)   __X__ Use CHG Soap as directed  ____ Use inhalers on the day of surgery  ____ Stop metformin 2 days prior to surgery    ____ Take 1/2 of usual insulin dose the night before surgery and none on the morning of surgery.   ____ Stop  Coumadin/Plavix/aspirin on   ____ Stop Anti-inflammatories on    __X__ Stop supplements until after surgery.  FISH OIL  ____ Bring C-Pap to the hospital.

## 2015-11-15 ENCOUNTER — Inpatient Hospital Stay
Admission: RE | Admit: 2015-11-15 | Discharge: 2015-11-19 | DRG: 331 | Disposition: A | Discharge status: Home / Self Care | Payer: Medicare Other | Source: Ambulatory Visit | Attending: Surgery | Admitting: Surgery

## 2015-11-15 ENCOUNTER — Encounter: Payer: Self-pay | Admitting: *Deleted

## 2015-11-15 ENCOUNTER — Inpatient Hospital Stay: Payer: Medicare Other | Admitting: Anesthesiology

## 2015-11-15 ENCOUNTER — Encounter
Admission: RE | Disposition: A | Discharge status: Home / Self Care | Payer: Self-pay | Source: Ambulatory Visit | Attending: Surgery

## 2015-11-15 DIAGNOSIS — K56699 Other intestinal obstruction unspecified as to partial versus complete obstruction: Secondary | ICD-10-CM

## 2015-11-15 DIAGNOSIS — R7303 Prediabetes: Secondary | ICD-10-CM

## 2015-11-15 DIAGNOSIS — Z803 Family history of malignant neoplasm of breast: Secondary | ICD-10-CM

## 2015-11-15 DIAGNOSIS — Z8052 Family history of malignant neoplasm of bladder: Secondary | ICD-10-CM

## 2015-11-15 DIAGNOSIS — K5732 Diverticulitis of large intestine without perforation or abscess without bleeding: Secondary | ICD-10-CM

## 2015-11-15 DIAGNOSIS — E119 Type 2 diabetes mellitus without complications: Secondary | ICD-10-CM | POA: Diagnosis present

## 2015-11-15 DIAGNOSIS — K5712 Diverticulitis of small intestine without perforation or abscess without bleeding: Secondary | ICD-10-CM

## 2015-11-15 DIAGNOSIS — K5792 Diverticulitis of intestine, part unspecified, without perforation or abscess without bleeding: Secondary | ICD-10-CM | POA: Diagnosis not present

## 2015-11-15 DIAGNOSIS — K5733 Diverticulitis of large intestine without perforation or abscess with bleeding: Secondary | ICD-10-CM

## 2015-11-15 DIAGNOSIS — F419 Anxiety disorder, unspecified: Secondary | ICD-10-CM

## 2015-11-15 DIAGNOSIS — N736 Female pelvic peritoneal adhesions (postinfective): Secondary | ICD-10-CM | POA: Diagnosis present

## 2015-11-15 DIAGNOSIS — Z87891 Personal history of nicotine dependence: Secondary | ICD-10-CM

## 2015-11-15 DIAGNOSIS — N200 Calculus of kidney: Secondary | ICD-10-CM

## 2015-11-15 DIAGNOSIS — E7800 Pure hypercholesterolemia, unspecified: Secondary | ICD-10-CM

## 2015-11-15 DIAGNOSIS — Z8249 Family history of ischemic heart disease and other diseases of the circulatory system: Secondary | ICD-10-CM

## 2015-11-15 DIAGNOSIS — Z833 Family history of diabetes mellitus: Secondary | ICD-10-CM

## 2015-11-15 DIAGNOSIS — K5730 Diverticulosis of large intestine without perforation or abscess without bleeding: Secondary | ICD-10-CM | POA: Diagnosis not present

## 2015-11-15 DIAGNOSIS — M1990 Unspecified osteoarthritis, unspecified site: Secondary | ICD-10-CM | POA: Diagnosis present

## 2015-11-15 DIAGNOSIS — R3129 Other microscopic hematuria: Secondary | ICD-10-CM

## 2015-11-15 DIAGNOSIS — N2889 Other specified disorders of kidney and ureter: Secondary | ICD-10-CM

## 2015-11-15 DIAGNOSIS — K635 Polyp of colon: Secondary | ICD-10-CM

## 2015-11-15 DIAGNOSIS — I10 Essential (primary) hypertension: Secondary | ICD-10-CM | POA: Diagnosis present

## 2015-11-15 HISTORY — PX: LAPAROSCOPIC SIGMOID COLECTOMY: SHX5928

## 2015-11-15 LAB — CBC
HEMATOCRIT: 46.3 % (ref 35.0–47.0)
HEMOGLOBIN: 15.8 g/dL (ref 12.0–16.0)
MCH: 32 pg (ref 26.0–34.0)
MCHC: 34.1 g/dL (ref 32.0–36.0)
MCV: 94.1 fL (ref 80.0–100.0)
Platelets: 187 10*3/uL (ref 150–440)
RBC: 4.92 MIL/uL (ref 3.80–5.20)
RDW: 14 % (ref 11.5–14.5)
WBC: 16.9 10*3/uL — ABNORMAL HIGH (ref 3.6–11.0)

## 2015-11-15 LAB — GLUCOSE, CAPILLARY
GLUCOSE-CAPILLARY: 81 mg/dL (ref 65–99)
Glucose-Capillary: 125 mg/dL — ABNORMAL HIGH (ref 65–99)

## 2015-11-15 LAB — CREATININE, SERUM
CREATININE: 0.76 mg/dL (ref 0.44–1.00)
GFR calc Af Amer: >60 mL/min (ref >60)
GFR calc non Af Amer: >60 mL/min (ref >60)

## 2015-11-15 SURGERY — COLECTOMY, SIGMOID, LAPAROSCOPIC
Anesthesia: General | Wound class: Clean Contaminated

## 2015-11-15 MED ORDER — PHENYLEPHRINE HCL 10 MG/ML IJ SOLN
INTRAMUSCULAR | Status: DC | PRN
  Administered 2015-11-15 (×3): 100 ug via INTRAVENOUS

## 2015-11-15 MED ORDER — SODIUM CHLORIDE 0.9 % IJ SOLN
INTRAMUSCULAR | Status: AC
  Filled 2015-11-15: qty 50

## 2015-11-15 MED ORDER — CYCLOBENZAPRINE HCL 10 MG PO TABS
10.0000 mg | ORAL_TABLET | Freq: Three times a day (TID) | ORAL | Status: DC
  Administered 2015-11-15 – 2015-11-17 (×5): 10 mg via ORAL
  Filled 2015-11-15 (×5): qty 1

## 2015-11-15 MED ORDER — FAMOTIDINE 20 MG PO TABS
20.0000 mg | ORAL_TABLET | Freq: Once | ORAL | Status: AC
  Administered 2015-11-15: 20 mg via ORAL

## 2015-11-15 MED ORDER — FENTANYL CITRATE (PF) 100 MCG/2ML IJ SOLN
INTRAMUSCULAR | Status: AC
  Administered 2015-11-15: 25 ug via INTRAVENOUS
  Filled 2015-11-15: qty 2

## 2015-11-15 MED ORDER — FENTANYL CITRATE (PF) 100 MCG/2ML IJ SOLN
25.0000 ug | INTRAMUSCULAR | Status: AC | PRN
  Administered 2015-11-15 (×6): 25 ug via INTRAVENOUS

## 2015-11-15 MED ORDER — ENOXAPARIN SODIUM 40 MG/0.4ML ~~LOC~~ SOLN
40.0000 mg | SUBCUTANEOUS | Status: DC
  Administered 2015-11-16 – 2015-11-18 (×3): 40 mg via SUBCUTANEOUS
  Filled 2015-11-15 (×5): qty 0.4

## 2015-11-15 MED ORDER — MIDAZOLAM HCL 2 MG/2ML IJ SOLN
INTRAMUSCULAR | Status: DC | PRN
  Administered 2015-11-15: 2 mg via INTRAVENOUS

## 2015-11-15 MED ORDER — SODIUM CHLORIDE 0.9 % IV SOLN
1.0000 g | INTRAVENOUS | Status: AC
  Administered 2015-11-15: 1 g via INTRAVENOUS
  Filled 2015-11-15: qty 1

## 2015-11-15 MED ORDER — BUPIVACAINE-EPINEPHRINE (PF) 0.5% -1:200000 IJ SOLN
INTRAMUSCULAR | Status: AC
  Filled 2015-11-15: qty 30

## 2015-11-15 MED ORDER — HEPARIN SODIUM (PORCINE) 5000 UNIT/ML IJ SOLN
INTRAMUSCULAR | Status: AC
  Filled 2015-11-15: qty 1

## 2015-11-15 MED ORDER — OXYCODONE HCL 5 MG PO TABS: 5.0000 mg | ORAL_TABLET | ORAL | Status: DC | PRN

## 2015-11-15 MED ORDER — HYDRALAZINE HCL 20 MG/ML IJ SOLN
10.0000 mg | INTRAMUSCULAR | Status: DC | PRN
  Administered 2015-11-15 – 2015-11-18 (×2): 10 mg via INTRAVENOUS
  Filled 2015-11-15 (×2): qty 1

## 2015-11-15 MED ORDER — HEPARIN SODIUM (PORCINE) 5000 UNIT/ML IJ SOLN
5000.0000 [IU] | Freq: Once | INTRAMUSCULAR | Status: AC
  Administered 2015-11-15: 5000 [IU] via SUBCUTANEOUS

## 2015-11-15 MED ORDER — PROPOFOL 10 MG/ML IV BOLUS
INTRAVENOUS | Status: DC | PRN
  Administered 2015-11-15: 150 mg via INTRAVENOUS

## 2015-11-15 MED ORDER — BUPIVACAINE LIPOSOME 1.3 % IJ SUSP
INTRAMUSCULAR | Status: AC
  Filled 2015-11-15: qty 20

## 2015-11-15 MED ORDER — METHOCARBAMOL 500 MG PO TABS
500.0000 mg | ORAL_TABLET | Freq: Four times a day (QID) | ORAL | Status: DC | PRN
  Filled 2015-11-15: qty 1

## 2015-11-15 MED ORDER — BUPIVACAINE-EPINEPHRINE (PF) 0.5% -1:200000 IJ SOLN
INTRAMUSCULAR | Status: DC | PRN
  Administered 2015-11-15: 30 mL

## 2015-11-15 MED ORDER — KETOROLAC TROMETHAMINE 30 MG/ML IJ SOLN
30.0000 mg | Freq: Four times a day (QID) | INTRAMUSCULAR | Status: DC
  Administered 2015-11-15 – 2015-11-19 (×15): 30 mg via INTRAVENOUS
  Filled 2015-11-15 (×15): qty 1

## 2015-11-15 MED ORDER — FENTANYL CITRATE (PF) 100 MCG/2ML IJ SOLN
INTRAMUSCULAR | Status: DC | PRN
  Administered 2015-11-15 (×3): 50 ug via INTRAVENOUS
  Administered 2015-11-15: 100 ug via INTRAVENOUS
  Administered 2015-11-15: 50 ug via INTRAVENOUS

## 2015-11-15 MED ORDER — SODIUM CHLORIDE 0.9 % IV SOLN
INTRAVENOUS | Status: DC
Start: 2015-11-15 — End: 2015-11-15
  Administered 2015-11-15: 12:00:00 via INTRAVENOUS

## 2015-11-15 MED ORDER — DEXAMETHASONE SODIUM PHOSPHATE 10 MG/ML IJ SOLN
INTRAMUSCULAR | Status: DC | PRN
  Administered 2015-11-15: 10 mg via INTRAVENOUS

## 2015-11-15 MED ORDER — DIPHENHYDRAMINE HCL 50 MG/ML IJ SOLN: 12.5000 mg | Freq: Four times a day (QID) | INTRAMUSCULAR | Status: DC | PRN

## 2015-11-15 MED ORDER — ACETAMINOPHEN 10 MG/ML IV SOLN
INTRAVENOUS | Status: DC | PRN
  Administered 2015-11-15: 1000 mg via INTRAVENOUS

## 2015-11-15 MED ORDER — ONDANSETRON HCL 4 MG/2ML IJ SOLN: 4.0000 mg | Freq: Once | INTRAMUSCULAR | Status: DC | PRN

## 2015-11-15 MED ORDER — BUPIVACAINE LIPOSOME 1.3 % IJ SUSP
INTRAMUSCULAR | Status: DC | PRN
  Administered 2015-11-15: 20 mL

## 2015-11-15 MED ORDER — ROCURONIUM BROMIDE 100 MG/10ML IV SOLN
INTRAVENOUS | Status: DC | PRN
  Administered 2015-11-15: 20 mg via INTRAVENOUS
  Administered 2015-11-15: 40 mg via INTRAVENOUS
  Administered 2015-11-15 (×2): 10 mg via INTRAVENOUS

## 2015-11-15 MED ORDER — KETOROLAC TROMETHAMINE 30 MG/ML IJ SOLN
INTRAMUSCULAR | Status: DC | PRN
  Administered 2015-11-15: 30 mg via INTRAVENOUS

## 2015-11-15 MED ORDER — ACETAMINOPHEN 10 MG/ML IV SOLN
INTRAVENOUS | Status: AC
  Filled 2015-11-15: qty 100

## 2015-11-15 MED ORDER — ONDANSETRON 8 MG PO TBDP: 4.0000 mg | ORAL_TABLET | Freq: Four times a day (QID) | ORAL | Status: DC | PRN

## 2015-11-15 MED ORDER — ONDANSETRON HCL 4 MG/2ML IJ SOLN: 4.0000 mg | Freq: Four times a day (QID) | INTRAMUSCULAR | Status: DC | PRN

## 2015-11-15 MED ORDER — FAMOTIDINE 20 MG PO TABS
ORAL_TABLET | ORAL | Status: AC
  Filled 2015-11-15: qty 1

## 2015-11-15 MED ORDER — GLYCOPYRROLATE 0.2 MG/ML IJ SOLN
INTRAMUSCULAR | Status: DC | PRN
  Administered 2015-11-15: 0.2 mg via INTRAVENOUS

## 2015-11-15 MED ORDER — ACETAMINOPHEN 500 MG PO TABS
1000.0000 mg | ORAL_TABLET | Freq: Four times a day (QID) | ORAL | Status: DC
  Administered 2015-11-15 – 2015-11-19 (×13): 1000 mg via ORAL
  Filled 2015-11-15 (×13): qty 2

## 2015-11-15 MED ORDER — CHLORHEXIDINE GLUCONATE 4 % EX LIQD
1.0000 | Freq: Once | CUTANEOUS | Status: DC
Start: 2015-11-16 — End: 2015-11-15

## 2015-11-15 MED ORDER — DEXTROSE IN LACTATED RINGERS 5 % IV SOLN
INTRAVENOUS | Status: DC
  Administered 2015-11-15 – 2015-11-18 (×4): via INTRAVENOUS

## 2015-11-15 MED ORDER — SUGAMMADEX SODIUM 200 MG/2ML IV SOLN
INTRAVENOUS | Status: DC | PRN
  Administered 2015-11-15: 140 mg via INTRAVENOUS

## 2015-11-15 MED ORDER — DIPHENHYDRAMINE HCL 12.5 MG/5ML PO ELIX: 12.5000 mg | ORAL_SOLUTION | Freq: Four times a day (QID) | ORAL | Status: DC | PRN

## 2015-11-15 MED ORDER — PANTOPRAZOLE SODIUM 40 MG PO TBEC
40.0000 mg | DELAYED_RELEASE_TABLET | Freq: Every day | ORAL | Status: DC
  Administered 2015-11-15 – 2015-11-19 (×5): 40 mg via ORAL
  Filled 2015-11-15 (×5): qty 1

## 2015-11-15 MED ORDER — HYDROMORPHONE HCL 1 MG/ML IJ SOLN: 0.5000 mg | INTRAMUSCULAR | Status: DC | PRN

## 2015-11-15 MED ORDER — ONDANSETRON HCL 4 MG/2ML IJ SOLN
INTRAMUSCULAR | Status: DC | PRN
  Administered 2015-11-15: 4 mg via INTRAVENOUS

## 2015-11-15 MED ORDER — LIDOCAINE HCL (CARDIAC) 20 MG/ML IV SOLN
INTRAVENOUS | Status: DC | PRN
  Administered 2015-11-15: 40 mg via INTRAVENOUS

## 2015-11-15 SURGICAL SUPPLY — 74 items
APPLIER CLIP 5 13 M/L LIGAMAX5 (MISCELLANEOUS)
APR CLP MED LRG 5 ANG JAW (MISCELLANEOUS)
BLADE SURG SZ10 CARB STEEL (BLADE) ×3 IMPLANT
BULB RESERV EVAC DRAIN JP 100C (MISCELLANEOUS) ×3 IMPLANT
CANISTER SUCT 1200ML W/VALVE (MISCELLANEOUS) ×3 IMPLANT
CATH FOL LEG HOLDER (MISCELLANEOUS) ×3 IMPLANT
CATH ROBINSON RED A/P 16FR (CATHETERS) IMPLANT
CATH TRAY 16F METER LATEX (MISCELLANEOUS) ×3 IMPLANT
CHLORAPREP W/TINT 26ML (MISCELLANEOUS) ×3 IMPLANT
CLIP APPLIE 5 13 M/L LIGAMAX5 (MISCELLANEOUS) IMPLANT
CNTNR SPEC 2.5X3XGRAD LEK (MISCELLANEOUS) ×1
CONT SPEC 4OZ STER OR WHT (MISCELLANEOUS) ×2
CONT SPEC 4OZ STRL OR WHT (MISCELLANEOUS) ×1
CONTAINER SPEC 2.5X3XGRAD LEK (MISCELLANEOUS) ×1 IMPLANT
DECANTER SPIKE VIAL GLASS SM (MISCELLANEOUS) ×3 IMPLANT
DEFOGGER SCOPE WARMER CLEARIFY (MISCELLANEOUS) ×3 IMPLANT
DRAIN CHANNEL JP 15F RND 16 (MISCELLANEOUS) ×3 IMPLANT
DRAIN CHANNEL JP 19F (MISCELLANEOUS) IMPLANT
DRAPE INCISE IOBAN 66X45 STRL (DRAPES) ×3 IMPLANT
DRAPE LEGGINS SURG 28X43 STRL (DRAPES) ×3 IMPLANT
DRAPE UNDER BUTTOCK W/FLU (DRAPES) ×3 IMPLANT
ELECT BLADE 6.5 EXT (BLADE) IMPLANT
ELECT CAUTERY BLADE 6.4 (BLADE) ×3 IMPLANT
ELECT REM PT RETURN 9FT ADLT (ELECTROSURGICAL) ×3
ELECTRODE REM PT RTRN 9FT ADLT (ELECTROSURGICAL) ×1 IMPLANT
GELPORT LAPAROSCOPIC (MISCELLANEOUS) ×3 IMPLANT
GLOVE BIO SURGEON STRL SZ7 (GLOVE) ×18 IMPLANT
GOWN STRL REUS W/ TWL LRG LVL3 (GOWN DISPOSABLE) ×7 IMPLANT
GOWN STRL REUS W/TWL LRG LVL3 (GOWN DISPOSABLE) ×21
HANDLE SUCTION POOLE (INSTRUMENTS) ×1 IMPLANT
HANDLE YANKAUER SUCT BULB TIP (MISCELLANEOUS) ×3 IMPLANT
IRRIGATION STRYKERFLOW (MISCELLANEOUS) ×1 IMPLANT
IRRIGATOR STRYKERFLOW (MISCELLANEOUS) ×3
IV NS 1000ML (IV SOLUTION) ×3
IV NS 1000ML BAXH (IV SOLUTION) ×1 IMPLANT
KIT PINK PAD W/HEAD ARE REST (MISCELLANEOUS) ×3
KIT PINK PAD W/HEAD ARM REST (MISCELLANEOUS) ×1 IMPLANT
KIT RM TURNOVER CYSTO AR (KITS) ×3 IMPLANT
L-HOOK LAP DISP 36CM (ELECTROSURGICAL) ×3
LHOOK LAP DISP 36CM (ELECTROSURGICAL) ×1 IMPLANT
LIQUID BAND (GAUZE/BANDAGES/DRESSINGS) ×6 IMPLANT
NEEDLE HYPO 22GX1.5 SAFETY (NEEDLE) ×3 IMPLANT
NS IRRIG 1000ML POUR BTL (IV SOLUTION) ×3 IMPLANT
PACK COLON CLEAN CLOSURE (MISCELLANEOUS) ×3 IMPLANT
PACK LAP CHOLECYSTECTOMY (MISCELLANEOUS) ×3 IMPLANT
PAD PREP 24X41 OB/GYN DISP (PERSONAL CARE ITEMS) ×3 IMPLANT
PENCIL ELECTRO HAND CTR (MISCELLANEOUS) ×3 IMPLANT
RELOAD BLUE (STAPLE) ×6 IMPLANT
RELOAD STAPLER BLUE 60MM (STAPLE) ×2 IMPLANT
RELOAD STAPLER WHITE 60MM (STAPLE) ×2 IMPLANT
SCISSORS METZENBAUM CVD 33 (INSTRUMENTS) IMPLANT
SHEARS HARMONIC ACE PLUS 36CM (ENDOMECHANICALS) ×3 IMPLANT
SLEEVE ENDOPATH XCEL 5M (ENDOMECHANICALS) ×3 IMPLANT
SOL PREP PVP 2OZ (MISCELLANEOUS) ×3
SOLUTION PREP PVP 2OZ (MISCELLANEOUS) ×1 IMPLANT
SPONGE LAP 18X18 5 PK (GAUZE/BANDAGES/DRESSINGS) ×6 IMPLANT
STAPLE ECHEON FLEX 60 POW ENDO (STAPLE) ×3 IMPLANT
STAPLER PROX 25M (MISCELLANEOUS) ×3 IMPLANT
STAPLER RELOAD BLUE 60MM (STAPLE) ×6
STAPLER RELOAD WHITE 60MM (STAPLE) ×6
STAPLER SKIN PROX 35W (STAPLE) IMPLANT
SUCT SIGMOIDOSCOPE TIP 18 W/TU (SUCTIONS) IMPLANT
SUCTION POOLE HANDLE (INSTRUMENTS) ×3
SURGILUBE 2OZ TUBE FLIPTOP (MISCELLANEOUS) ×3 IMPLANT
SUT MNCRL AB 4-0 PS2 18 (SUTURE) ×6 IMPLANT
SUT PDS AB 0 CT1 27 (SUTURE) ×6 IMPLANT
SUT SILK 0 SH 30 (SUTURE) IMPLANT
SUT SILK 2 0SH CR/8 30 (SUTURE) ×3 IMPLANT
SYR 30ML LL (SYRINGE) ×6 IMPLANT
SYRINGE IRR TOOMEY STRL 70CC (SYRINGE) ×3 IMPLANT
TOWEL OR 17X26 4PK STRL BLUE (TOWEL DISPOSABLE) ×3 IMPLANT
TROCAR XCEL 12X100 BLDLESS (ENDOMECHANICALS) ×3 IMPLANT
TROCAR XCEL NON-BLD 5MMX100MML (ENDOMECHANICALS) ×3 IMPLANT
TUBING INSUF HEATED (TUBING) ×3 IMPLANT

## 2015-11-15 NOTE — H&P (Signed)
Patient ID: Nicole Tucker, female   DOB: 16-Apr-1946, 70 y.o.   MRN: [id number]  History of Present Illness Nicole Tucker is a 70 y.o. female with chronic and recurrent diverticulitis. History of diverticular abscess. Had a recent colonoscopy showing no evidence of malignancy she does have asked sigmoid stricture as well. Discussed with the patient in detail and she has elected for laparoscopic sigmoid colectomy.  Past Medical History Past Medical History  Diagnosis Date  . Arthritis   . Diverticular disease   . Hypertension   . Diabetes mellitus without complication ([place])     New DX - no meds yet      Past Surgical History  Procedure Laterality Date  . Cataract extraction    . Colonoscopy N/A 10/07/2015    Procedure: COLONOSCOPY;  Surgeon: Lucilla Lame, MD;  Location: [place];  Service: Endoscopy;  Laterality: N/A;  . Dilation and curettage of uterus    . Tubal ligation      Allergies  Allergen Reactions  . Codeine Itching and Anxiety    Current Facility-Administered Medications  Medication Dose Route Frequency Provider Last Rate Last Dose  . 0.9 %  sodium chloride infusion   Intravenous Continuous Gunnar Fusi, MD 75 mL/hr at 11/15/15 1137    . [START ON 11/16/2015] chlorhexidine (HIBICLENS) 4 % liquid 1 application  1 application Topical Once Diego F Pabon, MD      . ertapenem [facility]) 1 g in sodium chloride 0.9 % 50 mL IVPB  1 g Intravenous On Call to OR Diego F Pabon, MD      . famotidine (PEPCID) 20 MG tablet           . heparin 5000 UNIT/ML injection             Family History Family History  Problem Relation Age of Onset  . Heart disease Mother   . Hypertension Mother   . Aneurysm Mother   . Alcohol abuse Father   . Bladder Cancer Sister   . Hypertension Sister   . Diabetes Paternal Grandmother   . Heart disease Paternal Grandmother   . Breast cancer Sister 2  . Hypertension Sister   . Diverticulitis Brother   . Hypertension Brother   .  Hypertension Sister       Social History Social History  Substance Use Topics  . Smoking status: Former Research scientist (life sciences)  . Smokeless tobacco: Never Used     Comment: quit 1992  . Alcohol Use: No        ROS negative  Physical Exam Blood pressure 141/80, pulse 57, temperature 96.5 F (35.8 C), temperature source Tympanic, resp. rate 16, height 5\' 3"  (1.6 m), weight 70.308 kg (155 lb), SpO2 100 %.  CONSTITUTIONAL: NAD EYES: Pupils equal, round, and reactive to light, Sclera non-icteric. EARS, NOSE, MOUTH AND THROAT: The oropharynx is clear. Oral mucosa is pink and moist. Hearing is intact to voice.  NECK: Trachea is midline, and there is no jugular venous distension. Thyroid is without palpable abnormalities. LYMPH NODES:  Lymph nodes in the neck are not enlarged. RESPIRATORY:  Lungs are clear, and breath sounds are equal bilaterally. Normal respiratory effort without pathologic use of accessory muscles. CARDIOVASCULAR: Heart is regular without murmurs, gallops, or rubs. GI: The abdomen is  soft, nontender, and nondistended. There were no palpable masses. There was no hepatosplenomegaly. There were normal bowel sounds. MUSCULOSKELETAL:  Normal muscle strength and tone in all four extremities.    SKIN: Skin turgor is normal.  There are no pathologic skin lesions.  NEUROLOGIC:  Motor and sensation is grossly normal.  Cranial nerves are grossly intact. PSYCH:  Alert and oriented to person, place and time. Affect is normal.  Data Reviewed I have personally reviewed the patient's imaging and medical records.    Assessment/Plan   Chronic and recurrent diverticulitis with previous episodes of abscess. She is today scheduled for elective sigmoid colectomy. Discussed with the patient in detail about the surgery, risks benefits and possible complications. She is in agreement she completed, her bowel prep and she is readyDiego pabon, MD [place] 11/15/2015, 11:39 AM

## 2015-11-15 NOTE — Anesthesia Procedure Notes (Signed)
Procedure Name: Intubation Date/Time: 11/15/2015 1:18 PM Performed by: Lorie Apley Pre-anesthesia Checklist: Patient identified, Emergency Drugs available, Suction available, Patient being monitored and Timeout performed Patient Re-evaluated:Patient Re-evaluated prior to inductionOxygen Delivery Method: Circle system utilized Preoxygenation: Pre-oxygenation with 100% oxygen Intubation Type: IV induction Ventilation: Mask ventilation without difficulty Laryngoscope Size: Mac and 3 Grade View: Grade I Tube type: Oral Tube size: 7.5 mm Number of attempts: 1 Airway Equipment and Method: Stylet Placement Confirmation: ETT inserted through vocal cords under direct vision,  positive ETCO2 and breath sounds checked- equal and bilateral Secured at: 22 cm Tube secured with: Tape Dental Injury: Teeth and Oropharynx as per pre-operative assessment

## 2015-11-15 NOTE — Anesthesia Preprocedure Evaluation (Addendum)
Anesthesia Evaluation  Patient identified by MRN, date of birth, ID band Patient awake    Reviewed: Allergy & Precautions, NPO status , Patient's Chart, lab work & pertinent test results, reviewed documented beta blocker date and time   Airway Mallampati: II  TM Distance: >3 FB     Dental  (+) Chipped   Pulmonary former smoker,           Cardiovascular hypertension, Pt. on medications      Neuro/Psych Anxiety    GI/Hepatic   Endo/Other  diabetes, Type 2  Renal/GU Renal InsufficiencyRenal disease     Musculoskeletal  (+) Arthritis ,   Abdominal   Peds  Hematology   Anesthesia Other Findings Pt usually runs a low heart rate, of about 40.  Reproductive/Obstetrics                            Anesthesia Physical Anesthesia Plan  ASA: II  Anesthesia Plan: General   Post-op Pain Management:    Induction: Intravenous  Airway Management Planned: Oral ETT  Additional Equipment:   Intra-op Plan:   Post-operative Plan:   Informed Consent: I have reviewed the patients History and Physical, chart, labs and discussed the procedure including the risks, benefits and alternatives for the proposed anesthesia with the patient or authorized representative who has indicated his/her understanding and acceptance.     Plan Discussed with: CRNA  Anesthesia Plan Comments:         Anesthesia Quick Evaluation

## 2015-11-15 NOTE — Op Note (Signed)
PROCEDURES: 1. Laparoscopic lysis of adhesions taking around 40 minutes of total operative time 2. Laparoscopic Low anterior resection 3. Laparoscopic takedown of splenic flexure 4. Omental flap  Pre-operative Diagnosis: Sigmoid stricture, chronic diverticulitis  Post-operative Diagnosis: same  Surgeon: Marjory Lies Pabon   Assistants: Dr. Azalee Course  Anesthesia: General endotracheal anesthesia  ASA Class: 2   Surgeon: Caroleen Hamman , MD FACS  Anesthesia: Gen. with endotracheal tube  Findings: Chronic diverticulitis Thick adhesions from the sigmoid to the pelvic wall, from the left ovary to the sigmoid and from the small bowel to the sigmoid   Estimated Blood Loss: 20cc         Drains: 15 FR pelvis         Specimens: Sigmoid colon       Complications: None         Condition: stable  Procedure Details  The patient was seen again in the Holding Room. The benefits, complications, treatment options, and expected outcomes were discussed with the patient. The risks of bleeding, infection, recurrence of symptoms, failure to resolve symptoms,  bowel injury, any of which could require further surgery were reviewed with the patient.   The patient was taken to Operating Room, identified as Nicole Tucker and the procedure verified.  A Time Out was held and the above information confirmed.  Prior to the induction of general anesthesia, antibiotic prophylaxis was administered. VTE prophylaxis was in place. General endotracheal anesthesia was then administered and tolerated well. After the induction, the abdomen was prepped with Chloraprep and draped in the sterile fashion. The patient was positioned in the supine position. Prior to the induction of general anesthesia, antibiotic prophylaxis was administered. VTE prophylaxis was in place. General endotracheal anesthesia was then administered and tolerated well. After the induction, the abdomen was prepped with Chloraprep and draped in the sterile  fashion. The patient was positioned in lithotomy position. 7 cm incision was created as a midline mini laparotomy. The abdominal cavity was entered under direct visualization and the GelPort device was placed. A 5 mm port was placed in the suprapubic area under direct visualization and pneumoperitoneum was obtained. There were dense adhesions from the omentum to the abdominal wall that where lysed in the standard fashion with the Harmonic scalpel. We also were able to place a 12 mm port in the right lower quadrant and two 5 mm ports in the left lower quadrant and suprapubic area under direct visualization. There was significant adhesive disease in the pelvis from the sigmoid to the pelvic wall and also from the sigmoid to the ovary and the uterus. This adhesions were lysed with a combination of finger fracturing and Harmonic scalpel. The white line of Toldt was identified and divided,  we mobilized the descending colon IN a lateral to medial fashion. We preserved the ureter at all times. We were also able to mobilize the splenic flexure using Harmonic scalpel in the standard fashion. We identified the takeoff of the inferior mesenteric artery, dissected the pedicle and divided using a 60 mm vascular echelon stapler in the standard fashion. Using the Harmonic's scalpel were able to divide the mesorectum and and also divided proximal to the mesentery of the descending colon. THe peritoneal reflection was incised and the rectum mobilized since there was disease at the rectosigmoid junction. Once we have an adequate visualization and mobilization we divided the colon distally at the distal to the  rectosigmoid area with a single blue load using the echelon stapler. Wel removed the [employer]  and visualized the descending colon in a direct fashion And divided it the  with standard 60 mm blue load. We opened the stopped and measure the diameter of the bowel. A 25 mm dilator was perfect size. A pursestring was created with  a keith needle and a nylon, the anvil device inserted and the pursestring tied down. Dr. Azalee Course was able to pass a 25 mm standard EEA stapler device through the anus into the rectal stump. Under direct visualization we perform an end to end anastomosis with the EEA device. A leak test was performed inflating the colon with a [place] syringe and a rubber catheter. No evidence of leak was observed.  There was no tension on the anastomosis and There was also adequate hemostasis. A 15 Blake drain was placed in the pelvis. We were able to mobilize the omentum and I created an omental flap to attach it to the anastomosis. The drain was sutured in place with a 3-0 nylon. All the laparoscopic ports were removed and a second look showed no evidence of any bleeding or any other injuries. We changed gloves and place a new tray to close the abdomen with a 0 PDS suture in a running fashion and the skin was closed with 4-0 Monocryl. Liposomal Marcaine was injected on all incision sites under direct visualization. Dermabond was used to coat all the skin incisions. Needle and laparotomy count were correct and there were no immediate occasions  Caroleen Hamman, MD, FACS

## 2015-11-15 NOTE — Anesthesia Postprocedure Evaluation (Signed)
Anesthesia Post Note  Patient: Nicole Tucker  Procedure(s) Performed: Procedure(s) (LRB): LAPAROSCOPIC SIGMOID COLECTOMY possible open, possible colostomy (N/A)  Patient location during evaluation: PACU Anesthesia Type: General Level of consciousness: awake and alert Pain management: pain level controlled Vital Signs Assessment: post-procedure vital signs reviewed and stable Respiratory status: spontaneous breathing, nonlabored ventilation, respiratory function stable and patient connected to nasal cannula oxygen Cardiovascular status: blood pressure returned to baseline and stable Postop Assessment: no signs of nausea or vomiting Anesthetic complications: no    Last Vitals:  Filed Vitals:   11/15/15 1713 11/15/15 1720  BP:  154/63  Pulse: 60 59  Temp:    Resp: 10 15    Last Pain:  Filed Vitals:   11/15/15 1726  PainSc: 3                  Molli Barrows

## 2015-11-15 NOTE — Transfer of Care (Signed)
Immediate Anesthesia Transfer of Care Note  Patient: Nicole Tucker  Procedure(s) Performed: Procedure(s) with comments: LAPAROSCOPIC SIGMOID COLECTOMY possible open, possible colostomy (N/A) - Please have Hand port ( gelport green by [employer]) ready  Patient Location: PACU  Anesthesia Type:General  Level of Consciousness: awake, alert , oriented and patient cooperative  Airway & Oxygen Therapy: Patient Spontanous Breathing and Patient connected to face mask oxygen  Post-op Assessment: Report given to RN, Post -op Vital signs reviewed and stable and Patient moving all extremities X 4  Post vital signs: Reviewed and stable  Last Vitals:  Filed Vitals:   11/15/15 1056  BP: 141/80  Pulse: 57  Temp: 35.8 C  Resp: 16    Last Pain: There were no vitals filed for this visit.       Complications: No apparent anesthesia complications

## 2015-11-16 LAB — BASIC METABOLIC PANEL
ANION GAP: 9 (ref 5–15)
BUN: 9 mg/dL (ref 6–20)
CHLORIDE: 105 mmol/L (ref 101–111)
CO2: 23 mmol/L (ref 22–32)
CREATININE: 0.65 mg/dL (ref 0.44–1.00)
Calcium: 8.7 mg/dL — ABNORMAL LOW (ref 8.9–10.3)
GFR calc non Af Amer: >60 mL/min (ref >60)
Glucose, Bld: 216 mg/dL — ABNORMAL HIGH (ref 65–99)
POTASSIUM: 3.3 mmol/L — AB (ref 3.5–5.1)
SODIUM: 137 mmol/L (ref 135–145)

## 2015-11-16 LAB — CBC
HEMATOCRIT: 43.6 % (ref 35.0–47.0)
HEMOGLOBIN: 14.9 g/dL (ref 12.0–16.0)
MCH: 32 pg (ref 26.0–34.0)
MCHC: 34.1 g/dL (ref 32.0–36.0)
MCV: 93.9 fL (ref 80.0–100.0)
PLATELETS: 189 10*3/uL (ref 150–440)
RBC: 4.65 MIL/uL (ref 3.80–5.20)
RDW: 14.2 % (ref 11.5–14.5)
WBC: 14.7 10*3/uL — AB (ref 3.6–11.0)

## 2015-11-16 NOTE — Plan of Care (Signed)
Problem: Pain Managment: Goal: General experience of comfort will improve Outcome: Not Progressing Pt. States she is in pain but refuses to take narcotics/ PRN pain medication. Pt will only take schedule Tylenol and Toradol.

## 2015-11-16 NOTE — Progress Notes (Signed)
70 yr old female POD#1 from Lap sigmoid colectomy for chronic diverticulitis.  Patient states doing well today.  She says she feels like she is having some bladder spasms occasionally.  She has not been up yet today.  She states that her pain is well control if she not moving much.  She has been reluctant to take pain medication because she doesn't like it.  Explained that she may need it for now because we want her out of bed today.  She was in agreement with this.    Filed Vitals:   11/16/15 0404 11/16/15 1158  BP: 159/64 150/72  Pulse: 55 53  Temp: 97.6 F (36.4 C) 97.5 F (36.4 C)  Resp: 18    I/O last 3 completed shifts: In: [id number] [I.V.:1755] Out: [id number] [Urine:1500; Drains:60; Blood:25] Total I/O In: [id number] [I.V.:802] Out: 975 [Urine:975]   PE:  Gen: NAD  Abd: soft, appropriately tender, incision sites c/d/i no drainage or erythema, JP drain serosanguineous Ext: 2+ pulses, no edema  CBC Latest Ref Rng 11/16/2015 11/15/2015 11/07/2015  WBC 3.6 - 11.0 K/uL 14.7(H) 16.9(H) 7.6  Hemoglobin 12.0 - 16.0 g/dL 14.9 15.8 15.3  Hematocrit 35.0 - 47.0 % 43.6 46.3 44.9  Platelets 150 - 440 K/uL 189 187 196   CMP Latest Ref Rng 11/16/2015 11/15/2015 11/07/2015  Glucose 65 - 99 mg/dL 216(H) - 98  BUN 6 - 20 mg/dL 9 - 17  Creatinine 0.44 - 1.00 mg/dL 0.65 0.76 0.64  Sodium 135 - 145 mmol/L 137 - 142  Potassium 3.5 - 5.1 mmol/L 3.3(L) - 3.9  Chloride 101 - 111 mmol/L 105 - 103  CO2 22 - 32 mmol/L 23 - 30  Calcium 8.9 - 10.3 mg/dL 8.7(L) - 9.5  Total Protein 6.5 - 8.1 g/dL - - -  Total Bilirubin 0.3 - 1.2 mg/dL - - -  Alkaline Phos 38 - 126 U/L - - -  AST 15 - 41 U/L - - -  ALT 14 - 54 U/L - - -    A/P:   70 yr old female POD#1 from Lap sigmoid colectomy for chronic diverticulitis.  Pain: Scheduled Tylenol, Toradol and flexeril with prn oxycodone and dilaudid GI:  Continue clear liquids until passing flatus, awaiting bowel function, continue JP drain  GU: good UOP, will d/c foley today   Encourage ambulation

## 2015-11-17 MED ORDER — CYCLOBENZAPRINE HCL 10 MG PO TABS
5.0000 mg | ORAL_TABLET | Freq: Three times a day (TID) | ORAL | Status: DC
  Administered 2015-11-17 – 2015-11-18 (×5): 5 mg via ORAL
  Filled 2015-11-17 (×5): qty 1

## 2015-11-17 NOTE — Progress Notes (Signed)
Pt has not voided in eight hours. Bladder scan showed 321 ml. Doctor Marcille Blanco was notified of findings. No new orders given at this time. Will continue to monitor pt.   Nicole Tucker

## 2015-11-17 NOTE — Progress Notes (Signed)
Pt has ambulated within her room, a total of 75 ft.. Pt did well. Will continue to encourage ambulation.   Nicole Tucker

## 2015-11-17 NOTE — Progress Notes (Signed)
70 yr old female POD#2 from Lap sigmoid colectomy for chronic diverticulitis.  Patient states doing well today.  She was able to get up and sit in chair yesterday and was walking to the bathroom without difficulty.  She states that she has been voiding well.  She has been passing flatus without difficulty.      Filed Vitals:   11/16/15 2018 11/17/15 0433  BP: 154/63 144/68  Pulse: 56 53  Temp: 97.9 F (36.6 C) 97.5 F (36.4 C)  Resp: 20 18   I/O last 3 completed shifts: In: 2644 [P.O.:750; I.V.:1894] Out: 2750 [Urine:2675; Drains:75] Total I/O In: 750 [P.O.:750] Out: -    PE:  Gen: NAD  Abd: soft, appropriately tender, incision sites c/d/i no drainage or erythema, JP drain serosanguineous Ext: 2+ pulses, no edema  CBC Latest Ref Rng 11/16/2015 11/15/2015 11/07/2015  WBC 3.6 - 11.0 K/uL 14.7(H) 16.9(H) 7.6  Hemoglobin 12.0 - 16.0 g/dL 14.9 15.8 15.3  Hematocrit 35.0 - 47.0 % 43.6 46.3 44.9  Platelets 150 - 440 K/uL 189 187 196   CMP Latest Ref Rng 11/16/2015 11/15/2015 11/07/2015  Glucose 65 - 99 mg/dL 216(H) - 98  BUN 6 - 20 mg/dL 9 - 17  Creatinine 0.44 - 1.00 mg/dL 0.65 0.76 0.64  Sodium 135 - 145 mmol/L 137 - 142  Potassium 3.5 - 5.1 mmol/L 3.3(L) - 3.9  Chloride 101 - 111 mmol/L 105 - 103  CO2 22 - 32 mmol/L 23 - 30  Calcium 8.9 - 10.3 mg/dL 8.7(L) - 9.5  Total Protein 6.5 - 8.1 g/dL - - -  Total Bilirubin 0.3 - 1.2 mg/dL - - -  Alkaline Phos 38 - 126 U/L - - -  AST 15 - 41 U/L - - -  ALT 14 - 54 U/L - - -    A/P:   70 yr old female POD#2  from Lap sigmoid colectomy for chronic diverticulitis.  Pain: Scheduled Tylenol, Toradol and flexeril with prn oxycodone and dilaudid GI:  Advance to Regular diet, awaiting bowel function, continue JP drain  GU: good UOP Encourage ambulation, walking in hallways

## 2015-11-18 ENCOUNTER — Encounter: Payer: Self-pay | Admitting: Surgery

## 2015-11-18 MED ORDER — LISINOPRIL 10 MG PO TABS
10.0000 mg | ORAL_TABLET | Freq: Every day | ORAL | Status: DC
  Administered 2015-11-18: 10 mg via ORAL
  Filled 2015-11-18: qty 1

## 2015-11-18 NOTE — Progress Notes (Signed)
3 Days Post-Op   Subjective:  70 year old female postop day #3 from a laparoscopic sigmoid colectomy. Patient doing very well. She denies any pain, nausea, fevers. She has been voiding well, passing flatus and some liquid stools.  Vital signs in last 24 hours: Temp:  [97.4 F (36.3 C)-98 F (36.7 C)] 97.4 F (36.3 C) (06/26 0544) Pulse Rate:  [44-54] 44 (06/26 0544) Resp:  [17-20] 20 (06/26 0543) BP: (148-179)/(68-83) 160/82 mmHg (06/26 0543) SpO2:  [95 %-98 %] 97 % (06/26 0544) Last BM Date: 11/17/15  Intake/Output from previous day: 06/25 0701 - 06/26 0700 In: 3147.3 [P.O.:1530; I.V.:1617.3] Out: [id number] [Urine:1150; Drains:40]  GI: Abdomen soft, minimally tender to palpation at the incision sites, nondistended. Well approximated laparoscopic sigmoid colectomy incision sites without evidence of erythema or drainage. JP in place draining serosanguineous fluid.  Lab Results:  CBC  Recent Labs  11/15/15 2021 11/16/15 0532  WBC 16.9* 14.7*  HGB 15.8 14.9  HCT 46.3 43.6  PLT 187 189   CMP     Component Value Date/Time   NA 137 11/16/2015 0532   NA 139 08/10/2014   NA 140 12/26/2011 1920   K 3.3* 11/16/2015 0532   K 3.6 12/26/2011 1920   CL 105 11/16/2015 0532   CL 103 12/26/2011 1920   CO2 23 11/16/2015 0532   CO2 26 12/26/2011 1920   GLUCOSE 216* 11/16/2015 0532   GLUCOSE 90 12/26/2011 1920   BUN 9 11/16/2015 0532   BUN 13 08/10/2014   BUN 19* 12/26/2011 1920   CREATININE 0.65 11/16/2015 0532   CREATININE 0.8 08/10/2014   CREATININE 0.76 12/26/2011 1920   CALCIUM 8.7* 11/16/2015 0532   CALCIUM 9.2 12/26/2011 1920   PROT 7.2 08/31/2015 0530   PROT 8.2 12/26/2011 1920   ALBUMIN 3.5 08/31/2015 0530   ALBUMIN 4.1 12/26/2011 1920   AST 20 08/31/2015 0530   AST 22 12/26/2011 1920   ALT 28 08/31/2015 0530   ALT 45 12/26/2011 1920   ALKPHOS 59 08/31/2015 0530   ALKPHOS 89 12/26/2011 1920   BILITOT 2.0* 08/31/2015 0530   BILITOT 0.8 12/26/2011 1920   GFRNONAA  >60 11/16/2015 0532   GFRNONAA >60 12/26/2011 1920   GFRAA >60 11/16/2015 0532   GFRAA >60 12/26/2011 1920   PT/INR No results for input(s): LABPROT, INR in the last 72 hours.  Studies/Results: No results found.  Assessment/Plan: 70 year old female status post laparoscopic sigmoid colectomy. Discussed patient possibility of going home at this point. Patient currently does not feel, will going home. Possibly remove JP drain later today. Encourage ambulation, incentive from her usage, oral intake. Saline lock IV today. We will revisit patient later today to discuss possible discharge later today versus tomorrow morning.   Clayburn Pert, MD FACS General Surgeon  11/18/2015

## 2015-11-18 NOTE — Care Management (Signed)
Patient POD 3 after elective lap sigmoid colectomy.  Tolerating diet, ambulating.  Passing gas and some stool.  No discharge needs identified by are team

## 2015-11-18 NOTE — Progress Notes (Signed)
Doing great Tolerating PO Pain controlled AVSS 40 cc JP  PE NAD Abd: soft, incisions c/d/i, JP serous  A/P DC in am Doing very well

## 2015-11-18 NOTE — Progress Notes (Signed)
Day RN attempted to notify MD multiple times about patient request for BP medication, lisinopril 10mg . No answer received as of 2018. Night RN will attempt.

## 2015-11-18 NOTE — Care Management Important Message (Signed)
Important Message  Patient Details  Name: Nicole Tucker MRN: [id number] Date of Birth: 1946-05-21   Medicare Important Message Given:  Yes    Katrina Stack, RN 11/18/2015, 9:38 AM

## 2015-11-19 LAB — SURGICAL PATHOLOGY

## 2015-11-19 MED ORDER — OXYCODONE-ACETAMINOPHEN 5-325 MG PO TABS: 1.0000 | ORAL_TABLET | ORAL | Status: DC | PRN

## 2015-11-19 NOTE — Progress Notes (Signed)
Patient discharged home with family.  All discharge instructions reviewed and discharge paperwork given to patient.  Patient verbalized understanding.  IVs removed in tact. Prescription given to patient with all questions and concerns addressed. Patient's family at bedside for transfer home.

## 2015-11-19 NOTE — Discharge Summary (Signed)
Patient ID: Nicole Tucker MRN: [id number] DOB/AGE: 1945/12/11 70 y.o.  Admit date: 11/15/2015 Discharge date: 11/19/2015   Discharge Diagnoses:  Active Problems:   Diverticulitis large intestine   Procedures:Laparoscopic low anterior resection  Hospital Course: 70 yo female admitted for elective sigmoid colectomy for chronic diverticulitis, stricture and hx of diverticular abscess. She had an uneventful post operative course. She went home on POD # 4. Her diet was advanced slowly and she did have flatus and a BM. JP was serous. At the time of DC she was ambulating , tolerating diet her VSS , her abdomen was soft and NT, incisions were healing well, no infection. Condition at the time of DC is stable  Disposition: 01-Home or Self Care  Discharge Instructions    Call MD for:  difficulty breathing, headache or visual disturbances    Complete by:  As directed      Call MD for:  extreme fatigue    Complete by:  As directed      Call MD for:  hives    Complete by:  As directed      Call MD for:  persistant dizziness or light-headedness    Complete by:  As directed      Call MD for:  persistant nausea and vomiting    Complete by:  As directed      Call MD for:  redness, tenderness, or signs of infection (pain, swelling, redness, odor or green/yellow discharge around incision site)    Complete by:  As directed      Call MD for:  severe uncontrolled pain    Complete by:  As directed      Call MD for:  temperature >100.4    Complete by:  As directed      Diet - low sodium heart healthy    Complete by:  As directed      Discharge instructions    Complete by:  As directed   Shower daily     Increase activity slowly    Complete by:  As directed      Lifting restrictions    Complete by:  As directed   20 lbs x 6 weeks     No dressing needed    Complete by:  As directed             Medication List    TAKE these medications        bisacodyl 5 MG EC tablet  Generic drug:   bisacodyl  Take 4 tablets at 8AM the day of your surgery.     erythromycin base 500 MG tablet  Commonly known as:  E-MYCIN  Take 1 tablet (500 mg total) by mouth 3 (three) times daily.     hydrochlorothiazide 25 MG tablet  Commonly known as:  HYDRODIURIL  Take 1 tablet (25 mg total) by mouth daily.     lisinopril 20 MG tablet  Commonly known as:  PRINIVIL,ZESTRIL  Take 1 tablet (20 mg total) by mouth daily.     multivitamin with minerals Tabs tablet  Take 1 tablet by mouth daily.     neomycin 500 MG tablet  Commonly known as:  MYCIFRADIN  Take 2 tablets (1,000 mg total) by mouth 3 (three) times daily.     omega-3 acid ethyl esters 1 g capsule  Commonly known as:  LOVAZA  Take 1 g by mouth daily.     oxyCODONE-acetaminophen 5-325 MG tablet  Commonly known as:  ROXICET  Take 1-2 tablets  by mouth every 4 (four) hours as needed for severe pain.     polyethylene glycol powder powder  Commonly known as:  GLYCOLAX/MIRALAX  Take 255 g by mouth once. Drink an 8 oz glass of the mixture every 15-20 minutes until all is gone.           Follow-up Information    Follow up with Jules Husbands, MD In 10 days.   Specialty:  General Surgery   Contact information:   [address] [place] [address] [phone]        Caroleen Hamman, MD FACS

## 2015-11-19 NOTE — Discharge Instructions (Addendum)
°  Diverticulitis Diverticulitis is when small pockets that have formed in your colon (large intestine) become infected or swollen. HOME CARE  Follow your doctor's instructions.  Follow a special diet if told by your doctor.  When you feel better, your doctor may tell you to change your diet. You may be told to eat a lot of fiber. Fruits and vegetables are good sources of fiber. Fiber makes it easier to poop (have bowel movements).  Take supplements or probiotics as told by your doctor.  Only take medicines as told by your doctor.  Keep all follow-up visits with your doctor. GET HELP IF:  Your pain does not get better.  You have a hard time eating food.  You are not pooping like normal. GET HELP RIGHT AWAY IF:  Your pain gets worse.  Your problems do not get better.  Your problems suddenly get worse.  You have a fever.  You keep throwing up (vomiting).  You have bloody or black, tarry poop (stool). MAKE SURE YOU:   Understand these instructions.  Will watch your condition.  Will get help right away if you are not doing well or get worse.   This information is not intended to replace advice given to you by your health care provider. Make sure you discuss any questions you have with your health care provider.   Document Released: 10/28/2007 Document Revised: 05/16/2013 Document Reviewed: 04/05/2013 Elsevier Interactive Patient Education [employer].

## 2015-11-20 ENCOUNTER — Telehealth: Payer: Self-pay | Admitting: Surgery

## 2015-11-20 NOTE — Telephone Encounter (Signed)
Patient had surgery and went to pick it up and [facility] told her it wasn't called in.  [place]

## 2015-11-20 NOTE — Telephone Encounter (Signed)
Disregard last message. Patient called back and said she forgot she had a written RX and someone would take it to [facility].

## 2015-11-28 ENCOUNTER — Encounter: Payer: Self-pay | Admitting: Surgery

## 2015-11-28 ENCOUNTER — Ambulatory Visit (INDEPENDENT_AMBULATORY_CARE_PROVIDER_SITE_OTHER): Payer: Medicare Other | Admitting: Surgery

## 2015-11-28 VITALS — BP 161/69 | HR 58 | Temp 98.0°F | Ht 63.0 in | Wt 156.0 lb

## 2015-11-28 DIAGNOSIS — Z09 Encounter for follow-up examination after completed treatment for conditions other than malignant neoplasm: Secondary | ICD-10-CM

## 2015-11-28 NOTE — Patient Instructions (Signed)
Please call our office if you have any questions or concerns.  

## 2015-11-28 NOTE — Progress Notes (Signed)
S/p lap LAR for diverticulitis on 6/23 Doing very well, taking PO No pain  PE NAD Incisions c/d/i, no infection  A/P doing very well Path d/w pt No heavy lifting  F/U prn

## 2015-12-18 ENCOUNTER — Ambulatory Visit: Payer: Medicare Other | Admitting: Surgery

## 2016-02-17 ENCOUNTER — Encounter: Payer: Self-pay | Admitting: Family Medicine

## 2016-02-17 ENCOUNTER — Ambulatory Visit (INDEPENDENT_AMBULATORY_CARE_PROVIDER_SITE_OTHER): Payer: Medicare Other | Admitting: Family Medicine

## 2016-02-17 VITALS — BP 116/60 | HR 60 | Temp 98.5°F | Resp 15 | Wt 155.0 lb

## 2016-02-17 DIAGNOSIS — K149 Disease of tongue, unspecified: Secondary | ICD-10-CM | POA: Diagnosis not present

## 2016-02-17 NOTE — Patient Instructions (Signed)
Will prescribe MVLB mixture with written rx for symptomatic relief.

## 2016-02-17 NOTE — Progress Notes (Signed)
Subjective:     Patient ID: Nicole Tucker, female   DOB: 1946/01/17, 70 y.o.   MRN: [id number]  HPI  Chief Complaint  Patient presents with  . Oral Swelling    Patient comes in office today with concerns of pain and swelling on left side of tongue for the past 24hrs. Patient states that yesterday 9/24 she noticed a white bump on the side of her tongue. Patient states that tongue is very red on left side and is painful to eat.   Denies injury or burn from food or drink: "I can't eat." No use of tobacco products and states she sees her dentist annually.   Review of Systems     Objective:   Physical Exam  Constitutional: She appears well-developed and well-nourished. No distress.  HENT:  Left side of tongue with white plaque and evolving erosions. Tender to exam with tongue blade. No other oral lesions noted.  Lymphadenopathy:    She has no cervical adenopathy.       Assessment:    1. Tongue irritation: ? Viral ? Injury- written rx for MVLB mixture 90 ml. With refill to use before meals and at bedtime.    Plan:    Further f/u if not improving.

## 2016-03-31 ENCOUNTER — Ambulatory Visit: Payer: Medicare Other | Admitting: Physician Assistant

## 2016-05-06 ENCOUNTER — Encounter: Payer: Self-pay | Admitting: Family Medicine

## 2016-05-11 ENCOUNTER — Encounter: Payer: Self-pay | Admitting: Physician Assistant

## 2016-06-17 ENCOUNTER — Ambulatory Visit (INDEPENDENT_AMBULATORY_CARE_PROVIDER_SITE_OTHER): Payer: Medicare Other | Admitting: Physician Assistant

## 2016-06-17 ENCOUNTER — Encounter: Payer: Self-pay | Admitting: Physician Assistant

## 2016-06-17 VITALS — BP 156/86 | HR 60 | Temp 98.0°F | Resp 16 | Wt 171.0 lb

## 2016-06-17 DIAGNOSIS — J40 Bronchitis, not specified as acute or chronic: Secondary | ICD-10-CM

## 2016-06-17 MED ORDER — PROMETHAZINE-DM 6.25-15 MG/5ML PO SYRP: 5.0000 mL | ORAL_SOLUTION | Freq: Four times a day (QID) | ORAL | 0 refills | Status: DC | PRN

## 2016-06-17 MED ORDER — AZITHROMYCIN 250 MG PO TABS: ORAL_TABLET | ORAL | 0 refills | Status: DC

## 2016-06-17 MED ORDER — PREDNISONE 10 MG (21) PO TBPK: ORAL_TABLET | ORAL | 0 refills | Status: DC

## 2016-06-17 NOTE — Progress Notes (Signed)
Patient: Nicole Tucker Female    DOB: [birth year]   71 y.o.   MRN: [id number] Visit Date: 06/17/2016  Today's Provider: Mar Daring, PA-C   Chief Complaint  Patient presents with  . URI   Subjective:    HPI Upper Respiratory Infection: Patient complains of symptoms of a URI. Symptoms include congestion and cough. Onset of symptoms was 2 weeks ago, gradually worsening since that time. She also c/o congestion and cough described as productive of green sputum and worsening over time for the past 2 days .  She is drinking plenty of fluids. Evaluation to date: none. Treatment to date: cough suppressants and decongestants. Patient reports she has been taking NyQuil pt reports moderate symptom control. Patient reports cough is worse in the evening. She has been around multiple sick contacts.    Allergies  Allergen Reactions  . Codeine Itching and Anxiety   Patient Active Problem List   Diagnosis Date Noted  . Diverticulitis large intestine 11/15/2015  . Other intestinal obstruction   . Diverticulitis of large intestine without perforation or abscess without bleeding   . Anxiety 07/03/2015  . Diverticulitis 07/03/2015  . Benign essential HTN 07/03/2015  . Colon polyp 07/03/2015  . Borderline diabetes 07/03/2015  . Hypercholesterolemia without hypertriglyceridemia 07/03/2015  . Calculus of kidney 10/23/2013  . Microscopic hematuria 10/23/2013  . Kidney lump 10/23/2013     Current Outpatient Prescriptions:  .  Biotin 10000 MCG TABS, Take by mouth 2 (two) times daily., Disp: , Rfl:  .  Cholecalciferol (VITAMIN D) 2000 units CAPS, Take by mouth., Disp: , Rfl:  .  Cinnamon Bark POWD, by Does not apply route., Disp: , Rfl:  .  Coenzyme Q10 (CO Q10) 200 MG CAPS, Take by mouth., Disp: , Rfl:  .  Ginkgo Biloba 40 MG TABS, Take 120 mg by mouth., Disp: , Rfl:  .  Gymnema Sylvestris Leaf POWD, by Does not apply route., Disp: , Rfl:  .  hydrochlorothiazide (HYDRODIURIL) 25 MG  tablet, Take 1 tablet (25 mg total) by mouth daily., Disp: 30 tablet, Rfl: 0 .  lisinopril (PRINIVIL,ZESTRIL) 20 MG tablet, Take 1 tablet (20 mg total) by mouth daily., Disp: 90 tablet, Rfl: 3 .  milk thistle 175 MG tablet, Take 175 mg by mouth 2 (two) times daily., Disp: , Rfl:  .  Multiple Vitamin (MULTIVITAMIN WITH MINERALS) TABS tablet, Take 1 tablet by mouth daily., Disp: , Rfl:  .  omega-3 acid ethyl esters (LOVAZA) 1 g capsule, Take 1 g by mouth daily., Disp: , Rfl:   Review of Systems  Constitutional: Positive for fatigue.  HENT: Positive for congestion and rhinorrhea. Negative for ear pain, postnasal drip, sinus pain, sinus pressure, sneezing, sore throat and tinnitus.   Respiratory: Positive for cough, shortness of breath and wheezing. Negative for chest tightness.   Cardiovascular: Negative.   Gastrointestinal: Negative for abdominal pain, nausea and vomiting.  Neurological: Positive for headaches. Negative for dizziness.    Social History  Substance Use Topics  . Smoking status: Former Smoker    Quit date: 11/29/1990  . Smokeless tobacco: Never Used     Comment: quit 1992  . Alcohol use No   Objective:   BP (!) 156/86 (BP Location: Left Arm, Patient Position: Sitting, Cuff Size: Large)   Pulse 60   Temp 98 F (36.7 C) (Oral)   Resp 16   Wt 171 lb (77.6 kg)   SpO2 96%   BMI 30.29 kg/m  Physical Exam  Constitutional: She appears well-developed and well-nourished. No distress.  HENT:  Head: Normocephalic and atraumatic.  Right Ear: Hearing, tympanic membrane, external ear and ear canal normal.  Left Ear: Hearing, tympanic membrane, external ear and ear canal normal.  Nose: Right sinus exhibits no maxillary sinus tenderness and no frontal sinus tenderness. Left sinus exhibits no maxillary sinus tenderness and no frontal sinus tenderness.  Mouth/Throat: Uvula is midline, oropharynx is clear and moist and mucous membranes are normal. No oropharyngeal exudate, posterior  oropharyngeal edema or posterior oropharyngeal erythema.  Eyes: Conjunctivae are normal. Pupils are equal, round, and reactive to light. Right eye exhibits no discharge. Left eye exhibits no discharge. No scleral icterus.  Neck: Normal range of motion. Neck supple. No tracheal deviation present. No thyromegaly present.  Cardiovascular: Normal rate, regular rhythm and normal heart sounds.  Exam reveals no gallop and no friction rub.   No murmur heard. Pulmonary/Chest: Effort normal. No stridor. No respiratory distress. She has no decreased breath sounds. She has wheezes (scattered throughout). She has no rhonchi. She has no rales.  Lymphadenopathy:    She has no cervical adenopathy.  Skin: Skin is warm and dry. She is not diaphoretic.  Vitals reviewed.     Assessment & Plan:     1. Bronchitis Worsening and not responding to OTC medications. Will treat with zpak and prednisone as below. Promethazine DM cough syrup given for nighttime cough. She may take Mucinex DM for daytime cough. Push fluids. Try to get plenty of rest. Call if no improvement. - azithromycin (ZITHROMAX) 250 MG tablet; Take 2 tablets PO on day one, and one tablet PO daily thereafter until completed.  Dispense: 6 tablet; Refill: 0 - predniSONE (STERAPRED UNI-PAK 21 TAB) 10 MG (21) TBPK tablet; Take as directed on package directions  Dispense: 21 tablet; Refill: 0 - promethazine-dextromethorphan (PROMETHAZINE-DM) 6.25-15 MG/5ML syrup; Take 5 mLs by mouth 4 (four) times daily as needed for cough.  Dispense: 118 mL; Refill: 0       Mar Daring, PA-C  [facility]

## 2016-06-17 NOTE — Patient Instructions (Signed)
May take Mucinex DM for daytime cough  Acute Bronchitis, Adult Acute bronchitis is sudden (acute) swelling of the air tubes (bronchi) in the lungs. Acute bronchitis causes these tubes to fill with mucus, which can make it hard to breathe. It can also cause coughing or wheezing. In adults, acute bronchitis usually goes away within 2 weeks. A cough caused by bronchitis may last up to 3 weeks. Smoking, allergies, and asthma can make the condition worse. Repeated episodes of bronchitis may cause further lung problems, such as chronic obstructive pulmonary disease (COPD). What are the causes? This condition can be caused by germs and by substances that irritate the lungs, including:  Cold and flu viruses. This condition is most often caused by the same virus that causes a cold.  Bacteria.  Exposure to tobacco smoke, dust, fumes, and air pollution. What increases the risk? This condition is more likely to develop in people who:  Have close contact with someone with acute bronchitis.  Are exposed to lung irritants, such as tobacco smoke, dust, fumes, and vapors.  Have a weak immune system.  Have a respiratory condition such as asthma. What are the signs or symptoms? Symptoms of this condition include:  A cough.  Coughing up clear, yellow, or green mucus.  Wheezing.  Chest congestion.  Shortness of breath.  A fever.  Body aches.  Chills.  A sore throat. How is this diagnosed? This condition is usually diagnosed with a physical exam. During the exam, your health care provider may order tests, such as chest X-rays, to rule out other conditions. He or she may also:  Test a sample of your mucus for bacterial infection.  Check the level of oxygen in your blood. This is done to check for pneumonia.  Do a chest X-ray or lung function testing to rule out pneumonia and other conditions.  Perform blood tests. Your health care provider will also ask about your symptoms and medical  history. How is this treated? Most cases of acute bronchitis clear up over time without treatment. Your health care provider may recommend:  Drinking more fluids. Drinking more makes your mucus thinner, which may make it easier to breathe.  Taking a medicine for a fever or cough.  Taking an antibiotic medicine.  Using an inhaler to help improve shortness of breath and to control a cough.  Using a cool mist vaporizer or humidifier to make it easier to breathe. Follow these instructions at home: Medicines  Take over-the-counter and prescription medicines only as told by your health care provider.  If you were prescribed an antibiotic, take it as told by your health care provider. Do not stop taking the antibiotic even if you start to feel better. General instructions  Get plenty of rest.  Drink enough fluids to keep your urine clear or pale yellow.  Avoid smoking and secondhand smoke. Exposure to cigarette smoke or irritating chemicals will make bronchitis worse. If you smoke and you need help quitting, ask your health care provider. Quitting smoking will help your lungs heal faster.  Use an inhaler, cool mist vaporizer, or humidifier as told by your health care provider.  Keep all follow-up visits as told by your health care provider. This is important. How is this prevented? To lower your risk of getting this condition again:  Wash your hands often with soap and water. If soap and water are not available, use hand sanitizer.  Avoid contact with people who have cold symptoms.  Try not to touch your  hands to your mouth, nose, or eyes.  Make sure to get the flu shot every year. Contact a health care provider if:  Your symptoms do not improve in 2 weeks of treatment. Get help right away if:  You cough up blood.  You have chest pain.  You have severe shortness of breath.  You become dehydrated.  You faint or keep feeling like you are going to faint.  You keep  vomiting.  You have a severe headache.  Your fever or chills gets worse. This information is not intended to replace advice given to you by your health care provider. Make sure you discuss any questions you have with your health care provider. Document Released: 06/18/2004 Document Revised: 12/04/2015 Document Reviewed: 10/30/2015 Elsevier Interactive Patient Education  2017 [employer].

## 2016-07-27 ENCOUNTER — Other Ambulatory Visit: Payer: Self-pay | Admitting: Physician Assistant

## 2016-07-27 DIAGNOSIS — Z1231 Encounter for screening mammogram for malignant neoplasm of breast: Secondary | ICD-10-CM

## 2016-10-27 ENCOUNTER — Ambulatory Visit (INDEPENDENT_AMBULATORY_CARE_PROVIDER_SITE_OTHER): Payer: Medicare Other

## 2016-10-27 ENCOUNTER — Ambulatory Visit
Admission: RE | Admit: 2016-10-27 | Discharge: 2016-10-27 | Disposition: A | Discharge status: Home / Self Care | Payer: Medicare Other | Source: Ambulatory Visit | Attending: Physician Assistant | Admitting: Physician Assistant

## 2016-10-27 ENCOUNTER — Ambulatory Visit (INDEPENDENT_AMBULATORY_CARE_PROVIDER_SITE_OTHER): Payer: Medicare Other | Admitting: Physician Assistant

## 2016-10-27 ENCOUNTER — Telehealth: Payer: Self-pay

## 2016-10-27 VITALS — BP 152/82 | HR 60 | Temp 98.8°F | Ht 63.0 in | Wt 175.2 lb

## 2016-10-27 DIAGNOSIS — Z1231 Encounter for screening mammogram for malignant neoplasm of breast: Secondary | ICD-10-CM | POA: Diagnosis not present

## 2016-10-27 DIAGNOSIS — Z0000 Encounter for general adult medical examination without abnormal findings: Secondary | ICD-10-CM

## 2016-10-27 DIAGNOSIS — R7303 Prediabetes: Secondary | ICD-10-CM | POA: Diagnosis not present

## 2016-10-27 DIAGNOSIS — H6123 Impacted cerumen, bilateral: Secondary | ICD-10-CM

## 2016-10-27 DIAGNOSIS — I10 Essential (primary) hypertension: Secondary | ICD-10-CM | POA: Diagnosis not present

## 2016-10-27 DIAGNOSIS — Z23 Encounter for immunization: Secondary | ICD-10-CM | POA: Diagnosis not present

## 2016-10-27 DIAGNOSIS — E7800 Pure hypercholesterolemia, unspecified: Secondary | ICD-10-CM

## 2016-10-27 DIAGNOSIS — R0989 Other specified symptoms and signs involving the circulatory and respiratory systems: Secondary | ICD-10-CM

## 2016-10-27 NOTE — Telephone Encounter (Signed)
-----   Message from Mar Daring, [place] sent at 10/27/2016  3:20 PM EDT ----- Normal mammogram. Repeat screening in one year.

## 2016-10-27 NOTE — Progress Notes (Signed)
Patient: Nicole Tucker Female    DOB: September 19, 1945   71 y.o.   MRN: [id number] Visit Date: 10/27/2016  Today's Provider: Mar Daring, PA-C   Chief Complaint  Patient presents with  . Diabetes  . Hypertension   Subjective:    HPI   Patient was seen by nurse health advisor this morning, for annual wellness exam.  09/10/15 AWE 10/14/09 Pap-neg 10/15/15 Mammogram-BI-RADS 1 10/15/15 BMD-WNL 10/07/15 Colonoscopy-diverticulosis   Diabetes Mellitus Type II, Follow-up:   Lab Results  Component Value Date   HGBA1C 6.6 (H) 09/10/2015    Last seen for diabetes 1 years ago.  Management since then includes continue working on healthy lifestyle. She reports no oral medications. She is not having side effects. Current symptoms include increase appetite and have been worsening. Home blood sugar records: fasting range: less than 100 most mornings  Episodes of hypoglycemia? no   Most Recent Eye Exam: not up to date  Weight trend: increasing steadily Prior visit with dietician: no Current diet: in general, an "unhealthy" diet Current exercise: none  Pertinent Labs:    Component Value Date/Time   CHOL 185 09/10/2015 1634   TRIG 96 09/10/2015 1634   HDL 52 09/10/2015 1634   LDLCALC 114 (H) 09/10/2015 1634   CREATININE 0.65 11/16/2015 0532   CREATININE 0.76 12/26/2011 1920    Wt Readings from Last 3 Encounters:  10/27/16 175 lb 3.2 oz (79.5 kg)  06/17/16 171 lb (77.6 kg)  02/17/16 155 lb (70.3 kg)    ------------------------------------------------------------------------   Hypertension, follow-up:  BP Readings from Last 3 Encounters:  10/27/16 (!) 152/82  06/17/16 (!) 156/86  02/17/16 116/60    She was last seen for hypertension 1 years ago.  BP at that visit was 152/82. Management changes since that visit include no changes. She reports excellent compliance with treatment. She is not having side effects.  She is not exercising. She is adherent to  low salt diet.   Outside blood pressures are stable. She is experiencing palpitations.  Patient denies chest pain.   Cardiovascular risk factors include advanced age (older than 77 for men, 57 for women), diabetes mellitus and hypertension.  Use of agents associated with hypertension: none.   Patient reports at home readings have been normal and unchanged. She reports that she was brought back quickly and had taken the stairs up today.   Weight trend: increasing steadily Wt Readings from Last 3 Encounters:  10/27/16 175 lb 3.2 oz (79.5 kg)  06/17/16 171 lb (77.6 kg)  02/17/16 155 lb (70.3 kg)    Current diet: in general, an "unhealthy" diet ------------------------------------------------------------------------     Allergies  Allergen Reactions  . Codeine Itching and Anxiety     Current Outpatient Prescriptions:  .  Biotin 10000 MCG TABS, Take by mouth 2 (two) times daily., Disp: , Rfl:  .  Cholecalciferol (VITAMIN D) 2000 units CAPS, Take by mouth., Disp: , Rfl:  .  Cinnamon Bark POWD, by Does not apply route., Disp: , Rfl:  .  Coenzyme Q10 (CO Q10) 200 MG CAPS, Take by mouth., Disp: , Rfl:  .  Ginkgo Biloba 40 MG TABS, Take 120 mg by mouth., Disp: , Rfl:  .  Gymnema Sylvestris Leaf POWD, by Does not apply route., Disp: , Rfl:  .  hydrochlorothiazide (HYDRODIURIL) 25 MG tablet, Take 1 tablet (25 mg total) by mouth daily., Disp: 30 tablet, Rfl: 0 .  lisinopril (PRINIVIL,ZESTRIL) 20 MG tablet, Take 1 tablet (  20 mg total) by mouth daily., Disp: 90 tablet, Rfl: 3 .  milk thistle 175 MG tablet, Take 175 mg by mouth. , Disp: , Rfl:  .  Multiple Vitamin (MULTIVITAMIN WITH MINERALS) TABS tablet, Take 1 tablet by mouth daily., Disp: , Rfl:  .  omega-3 acid ethyl esters (LOVAZA) 1 g capsule, Take 1 g by mouth daily., Disp: , Rfl:   Review of Systems  Constitutional: Negative.   HENT: Positive for hearing loss (left ear). Negative for ear pain (left ear).   Eyes: Positive for visual  disturbance (following cataract surgery; patient does not want to have fixed).  Respiratory: Negative.   Cardiovascular: Positive for palpitations (occurs rarely with the lightheadedness after sitting long periods).  Gastrointestinal: Negative.   Endocrine: Positive for polyphagia.  Genitourinary: Negative.   Musculoskeletal: Negative.   Skin: Negative.   Allergic/Immunologic: Negative.   Neurological: Positive for light-headedness (very rarely with standing after sitting for long periods).       Pressure around her head after standing from a long period of time  Hematological: Negative.   Psychiatric/Behavioral: Negative.     Social History  Substance Use Topics  . Smoking status: Former Smoker    Types: Cigarettes    Quit date: 11/29/1990  . Smokeless tobacco: Never Used     Comment: quit 1992  . Alcohol use 3.0 oz/week    5 Glasses of wine per week   Objective:  Vital Signs - Last Recorded  Most recent update: 10/27/2016 8:50 AM  BP    152/82 (BP Location: Right Arm)     Pulse  60     Temp  98.8 F (37.1 C) (Oral)     Ht  5\' 3"  (1.6 m)     Wt  175 lb 3.2 oz (79.5 kg)      BMI  31.04 kg/m     Current Exercise Habits: The patient does not participate in regular exercise at present     Fall Risk  10/27/2016 09/10/2015 05/17/2015  Falls in the past year? No No Yes  Number falls in past yr: - - 1  Injury with Fall? - - No   Depression screen [facility] 2/9 10/27/2016 10/27/2016 09/10/2015  Decreased Interest 0 0 1  Down, Depressed, Hopeless 0 0 0  PHQ - 2 Score 0 0 1  Altered sleeping 2 - -  Tired, decreased energy 1 - -  Change in appetite 1 - -  Feeling bad or failure about yourself  0 - -  Trouble concentrating 0 - -  Moving slowly or fidgety/restless 0 - -  Suicidal thoughts 0 - -  PHQ-9 Score 4 - -    Cognitive Testing - 6-CIT  Correct? Score   What year is it? yes 0 0 or 4  What month is it? yes 0 0 or 3  Memorize:    Pia Mau,  44,  [place],      What time is it? (within 1 hour) yes 0 0 or 3  Count backwards from 20 yes 0 0, 2, or 4  Name the months of the year yes 0 0, 2, or 4  Repeat name & address above no 4 0, 2, 4, 6, 8, or 10       TOTAL SCORE  4/28   Interpretation:  Normal  Normal (0-7) Abnormal (8-28)    Audit-C Alcohol Use Screening  Question Answer Points  How often do you have alcoholic drink?  3 times weekly 1  On days you do drink alcohol, how many drinks do you typically consume? 2 1  How oftey will you drink 6 or more in a total? never 0  Total Score:  2   A score of 3 or more in women, and 4 or more in men indicates increased risk for alcohol abuse, EXCEPT if all of the points are from question 1.  Physical Exam  Constitutional: She is oriented to person, place, and time. She appears well-developed and well-nourished. No distress.  HENT:  Head: Normocephalic and atraumatic.  Right Ear: External ear and ear canal normal.  Left Ear: External ear and ear canal normal.  Nose: Nose normal.  Mouth/Throat: Uvula is midline, oropharynx is clear and moist and mucous membranes are normal. No oropharyngeal exudate.  Cerumen impaction noted bilaterally  Eyes: Conjunctivae and EOM are normal. Pupils are equal, round, and reactive to light. Right eye exhibits no discharge. Left eye exhibits no discharge. No scleral icterus.  Neck: Normal range of motion. Neck supple. No JVD present. Carotid bruit is present (left sided). No tracheal deviation present. No thyromegaly present.  Cardiovascular: Normal rate, regular rhythm, normal heart sounds and intact distal pulses.  Exam reveals no gallop and no friction rub.   No murmur heard. Pulmonary/Chest: Effort normal and breath sounds normal. No respiratory distress. She has no wheezes. She has no rales. She exhibits no tenderness.  Abdominal: Soft. Bowel sounds are normal. She exhibits no distension and no mass. There is no tenderness. There is no rebound and no  guarding.  Musculoskeletal: Normal range of motion. She exhibits no edema or tenderness.  Lymphadenopathy:    She has no cervical adenopathy.  Neurological: She is alert and oriented to person, place, and time.  Skin: Skin is warm and dry. No rash noted. She is not diaphoretic.  Psychiatric: She has a normal mood and affect. Her behavior is normal. Judgment and thought content normal.  Vitals reviewed.      Assessment & Plan:     1. Bruit of left carotid artery Heard today on exam. Will get [place] to further evaluate. No symptoms. Mother had mild stroke due to carotid atherosclerosis. Will f/u pending results and refer to vascular if needed.  - US Carotid Duplex Bilateral; Future  2. Benign essential HTN Elevated today but patient states home readings normal. Continue lisniopril 20mg  and HCTZ 25mg . Will check labs as below and f/u pending results. - CBC w/Diff/Platelet - Comprehensive Metabolic Panel (CMET) - Lipid panel - HgB A1c  3. Hypercholesterolemia without hypertriglyceridemia Stable. Continue fish oil. Will check labs and carotid duplex. May add cholesterol lowering medication, especially if carotid duplex is abnormal.  - CBC w/Diff/Platelet - Comprehensive Metabolic Panel (CMET) - Lipid panel - HgB A1c  4. Borderline diabetes Diet controlled but patient has had significant weight gain since her bowel surgery to remove stricture and diverticulitis (20 pounds since Sept 2017). Will check labs as below and f/u pending results. - CBC w/Diff/Platelet - Comprehensive Metabolic Panel (CMET) - Lipid panel - HgB A1c  5. Bilateral impacted cerumen Bilateral cerumen impaction noted. Patient declined wanting ears cleaned today. Will try at home on her own. Advised to use debrox to soften wax. Showed the proper tool to use and to avoid qtip use.       Mar Daring, PA-C  [facility]

## 2016-10-27 NOTE — Telephone Encounter (Signed)
Pt informed

## 2016-10-27 NOTE — Patient Instructions (Signed)
[name] , Thank you for taking time to come for your Medicare Wellness Visit. I appreciate your ongoing commitment to your health goals. Please review the following plan we discussed and let me know if I can assist you in the future.   Screening recommendations/referrals: Colonoscopy: completed 10/07/15, due 09/2025 Mammogram: completed 10/15/15, due now Bone Density: completed 10/15/15 Recommended yearly ophthalmology/optometry visit for glaucoma screening and checkup Recommended yearly dental visit for hygiene and checkup  Vaccinations: Influenza vaccine: due 01/2017 Pneumococcal vaccine: completed series Tdap vaccine: completed 10/14/09, due 09/2019 Shingles vaccine: declined    Advanced directives: Advance directive discussed with you today. Even though you declined this today please call our office should you change your mind and we can give you the proper paperwork for you to fill out.  Conditions/risks identified: Recommend increasing exercise to 3 days a week for 30 minutes at a time.   Next appointment: None, need to schedule 1 year AWV.   Preventive Care 69 Years and Older, Female Preventive care refers to lifestyle choices and visits with your health care provider that can promote health and wellness. What does preventive care include?  A yearly physical exam. This is also called an annual well check.  Dental exams once or twice a year.  Routine eye exams. Ask your health care provider how often you should have your eyes checked.  Personal lifestyle choices, including:  Daily care of your teeth and gums.  Regular physical activity.  Eating a healthy diet.  Avoiding tobacco and drug use.  Limiting alcohol use.  Practicing safe sex.  Taking low-dose aspirin every day.  Taking vitamin and mineral supplements as recommended by your health care provider. What happens during an annual well check? The services and screenings done by your health care provider  during your annual well check will depend on your age, overall health, lifestyle risk factors, and family history of disease. Counseling  Your health care provider may ask you questions about your:  Alcohol use.  Tobacco use.  Drug use.  Emotional well-being.  Home and relationship well-being.  Sexual activity.  Eating habits.  History of falls.  Memory and ability to understand (cognition).  Work and work Statistician.  Reproductive health. Screening  You may have the following tests or measurements:  Height, weight, and BMI.  Blood pressure.  Lipid and cholesterol levels. These may be checked every 5 years, or more frequently if you are over 19 years old.  Skin check.  Lung cancer screening. You may have this screening every year starting at age 74 if you have a 30-pack-year history of smoking and currently smoke or have quit within the past 15 years.  Fecal occult blood test (FOBT) of the stool. You may have this test every year starting at age 82.  Flexible sigmoidoscopy or colonoscopy. You may have a sigmoidoscopy every 5 years or a colonoscopy every 10 years starting at age 42.  Hepatitis C blood test.  Hepatitis B blood test.  Sexually transmitted disease (STD) testing.  Diabetes screening. This is done by checking your blood sugar (glucose) after you have not eaten for a while (fasting). You may have this done every 1-3 years.  Bone density scan. This is done to screen for osteoporosis. You may have this done starting at age 48.  Mammogram. This may be done every 1-2 years. Talk to your health care provider about how often you should have regular mammograms. Talk with your health care provider about your test results,  treatment options, and if necessary, the need for more tests. Vaccines  Your health care provider may recommend certain vaccines, such as:  Influenza vaccine. This is recommended every year.  Tetanus, diphtheria, and acellular pertussis  (Tdap, Td) vaccine. You may need a Td booster every 10 years.  Zoster vaccine. You may need this after age 18.  Pneumococcal 13-valent conjugate (PCV13) vaccine. One dose is recommended after age 15.  Pneumococcal polysaccharide (PPSV23) vaccine. One dose is recommended after age 58. Talk to your health care provider about which screenings and vaccines you need and how often you need them. This information is not intended to replace advice given to you by your health care provider. Make sure you discuss any questions you have with your health care provider. Document Released: 06/07/2015 Document Revised: 01/29/2016 Document Reviewed: 03/12/2015 Elsevier Interactive Patient Education  2017 [place] Prevention in the Home Falls can cause injuries. They can happen to people of all ages. There are many things you can do to make your home safe and to help prevent falls. What can I do on the outside of my home?  Regularly fix the edges of walkways and driveways and fix any cracks.  Remove anything that might make you trip as you walk through a door, such as a raised step or threshold.  Trim any bushes or trees on the path to your home.  Use bright outdoor lighting.  Clear any walking paths of anything that might make someone trip, such as rocks or tools.  Regularly check to see if handrails are loose or broken. Make sure that both sides of any steps have handrails.  Any raised decks and porches should have guardrails on the edges.  Have any leaves, snow, or ice cleared regularly.  Use sand or salt on walking paths during winter.  Clean up any spills in your garage right away. This includes oil or grease spills. What can I do in the bathroom?  Use night lights.  Install grab bars by the toilet and in the tub and shower. Do not use towel bars as grab bars.  Use non-skid mats or decals in the tub or shower.  If you need to sit down in the shower, use a plastic, non-slip  stool.  Keep the floor dry. Clean up any water that spills on the floor as soon as it happens.  Remove soap buildup in the tub or shower regularly.  Attach bath mats securely with double-sided non-slip rug tape.  Do not have throw rugs and other things on the floor that can make you trip. What can I do in the bedroom?  Use night lights.  Make sure that you have a light by your bed that is easy to reach.  Do not use any sheets or blankets that are too big for your bed. They should not hang down onto the floor.  Have a firm chair that has side arms. You can use this for support while you get dressed.  Do not have throw rugs and other things on the floor that can make you trip. What can I do in the kitchen?  Clean up any spills right away.  Avoid walking on wet floors.  Keep items that you use a lot in easy-to-reach places.  If you need to reach something above you, use a strong step stool that has a grab bar.  Keep electrical cords out of the way.  Do not use floor polish or wax that makes floors  slippery. If you must use wax, use non-skid floor wax.  Do not have throw rugs and other things on the floor that can make you trip. What can I do with my stairs?  Do not leave any items on the stairs.  Make sure that there are handrails on both sides of the stairs and use them. Fix handrails that are broken or loose. Make sure that handrails are as long as the stairways.  Check any carpeting to make sure that it is firmly attached to the stairs. Fix any carpet that is loose or worn.  Avoid having throw rugs at the top or bottom of the stairs. If you do have throw rugs, attach them to the floor with carpet tape.  Make sure that you have a light switch at the top of the stairs and the bottom of the stairs. If you do not have them, ask someone to add them for you. What else can I do to help prevent falls?  Wear shoes that:  Do not have high heels.  Have rubber bottoms.  Are  comfortable and fit you well.  Are closed at the toe. Do not wear sandals.  If you use a stepladder:  Make sure that it is fully opened. Do not climb a closed stepladder.  Make sure that both sides of the stepladder are locked into place.  Ask someone to hold it for you, if possible.  Clearly mark and make sure that you can see:  Any grab bars or handrails.  First and last steps.  Where the edge of each step is.  Use tools that help you move around (mobility aids) if they are needed. These include:  Canes.  Walkers.  Scooters.  Crutches.  Turn on the lights when you go into a dark area. Replace any light bulbs as soon as they burn out.  Set up your furniture so you have a clear path. Avoid moving your furniture around.  If any of your floors are uneven, fix them.  If there are any pets around you, be aware of where they are.  Review your medicines with your doctor. Some medicines can make you feel dizzy. This can increase your chance of falling. Ask your doctor what other things that you can do to help prevent falls. This information is not intended to replace advice given to you by your health care provider. Make sure you discuss any questions you have with your health care provider. Document Released: 03/07/2009 Document Revised: 10/17/2015 Document Reviewed: 06/15/2014 Elsevier Interactive Patient Education  2017 [employer].

## 2016-10-27 NOTE — Progress Notes (Signed)
Subjective:   Nicole Tucker is a 71 y.o. female who presents for Medicare Annual (Subsequent) preventive examination.  Review of Systems:  N/A  Cardiac Risk Factors include: advanced age (>81men, >44 women);dyslipidemia;hypertension;obesity (BMI >30kg/m2)     Objective:     Vitals: BP (!) 152/82 (BP Location: Right Arm)   Pulse 60   Temp 98.8 F (37.1 C) (Oral)   Ht 5\' 3"  (1.6 m)   Wt 175 lb 3.2 oz (79.5 kg)   BMI 31.04 kg/m   Body mass index is 31.04 kg/m.   Tobacco History  Smoking Status  . Former Smoker  . Types: Cigarettes  . Quit date: 11/29/1990  Smokeless Tobacco  . Never Used    Comment: quit 1992     Counseling given: Not Answered   Past Medical History:  Diagnosis Date  . Arthritis   . Diabetes mellitus without complication ([place])    New DX - no meds yet  . Diverticular disease   . Hypertension    Past Surgical History:  Procedure Laterality Date  . CATARACT EXTRACTION    . COLONOSCOPY N/A 10/07/2015   Procedure: COLONOSCOPY;  Surgeon: Lucilla Lame, MD;  Location: [facility];  Service: Endoscopy;  Laterality: N/A;  . DILATION AND CURETTAGE OF UTERUS    . LAPAROSCOPIC SIGMOID COLECTOMY N/A 11/15/2015   Procedure: LAPAROSCOPIC SIGMOID COLECTOMY possible open, possible colostomy;  Surgeon: Jules Husbands, MD;  Location: [facility];  Service: General;  Laterality: N/A;  Please have Hand port ( gelport green by [employer]) ready  . TUBAL LIGATION     Family History  Problem Relation Age of Onset  . Heart disease Mother   . Hypertension Mother   . Aneurysm Mother   . Alcohol abuse Father   . Bladder Cancer Sister   . Hypertension Sister   . Breast cancer Sister 25  . Hypertension Sister   . Diverticulitis Brother   . Hypertension Brother   . Hypertension Sister   . Diabetes Paternal Grandmother   . Heart disease Paternal Grandmother    History  Sexual Activity  . Sexual activity: Not on file    Outpatient Encounter Prescriptions  as of 10/27/2016  Medication Sig  . Biotin 10000 MCG TABS Take by mouth 2 (two) times daily.  . Cholecalciferol (VITAMIN D) 2000 units CAPS Take by mouth.  Wallace Cullens POWD by Does not apply route.  . Coenzyme Q10 (CO Q10) 200 MG CAPS Take by mouth.  . Ginkgo Biloba 40 MG TABS Take 120 mg by mouth.  Haydee Salter Leaf POWD by Does not apply route.  . hydrochlorothiazide (HYDRODIURIL) 25 MG tablet Take 1 tablet (25 mg total) by mouth daily.  Marland Kitchen lisinopril (PRINIVIL,ZESTRIL) 20 MG tablet Take 1 tablet (20 mg total) by mouth daily.  . milk thistle 175 MG tablet Take 175 mg by mouth.   . Multiple Vitamin (MULTIVITAMIN WITH MINERALS) TABS tablet Take 1 tablet by mouth daily.  Marland Kitchen omega-3 acid ethyl esters (LOVAZA) 1 g capsule Take 1 g by mouth daily.  . [DISCONTINUED] azithromycin (ZITHROMAX) 250 MG tablet Take 2 tablets PO on day one, and one tablet PO daily thereafter until completed.  . [DISCONTINUED] predniSONE (STERAPRED UNI-PAK 21 TAB) 10 MG (21) TBPK tablet Take as directed on package directions (Patient not taking: Reported on 10/27/2016)  . [DISCONTINUED] promethazine-dextromethorphan (PROMETHAZINE-DM) 6.25-15 MG/5ML syrup Take 5 mLs by mouth 4 (four) times daily as needed for cough. (Patient not taking: Reported on  10/27/2016)   No facility-administered encounter medications on file as of 10/27/2016.     Activities of Daily Living In your present state of health, do you have any difficulty performing the following activities: 10/27/2016 11/15/2015  Hearing? N N  Vision? N N  Difficulty concentrating or making decisions? Y N  Walking or climbing stairs? N N  Dressing or bathing? N N  Doing errands, shopping? N N  Preparing Food and eating ? N -  Using the Toilet? N -  In the past six months, have you accidently leaked urine? N -  Do you have problems with loss of bowel control? N -  Managing your Medications? N -  Managing your Finances? N -  Housekeeping or managing your  Housekeeping? N -  Some recent data might be hidden    Patient Care Team: Rubye Beach as PCP - General (Family Medicine)    Assessment:     Exercise Activities and Dietary recommendations Current Exercise Habits: The patient does not participate in regular exercise at present (gardening), Exercise limited by: None identified  Goals    . Exercise 3x per week (30 min per time)          Recommend increasing exercise to walking 3 days a week for 30 minutes.      Fall Risk Fall Risk  10/27/2016 09/10/2015 05/17/2015  Falls in the past year? No No Yes  Number falls in past yr: - - 1  Injury with Fall? - - No   Depression Screen PHQ 2/9 Scores 10/27/2016 10/27/2016 09/10/2015  PHQ - 2 Score 0 0 1  PHQ- 9 Score 4 - -     Cognitive Function     6CIT Screen 10/27/2016  What Year? 0 points  What month? 0 points  What time? 0 points  Count back from 20 0 points  Months in reverse 0 points  Repeat phrase 4 points  Total Score 4    Immunization History  Administered Date(s) Administered  . Pneumococcal Conjugate-13 09/10/2015  . Pneumococcal Polysaccharide-23 10/27/2016  . Tdap 10/14/2009   Screening Tests Health Maintenance  Topic Date Due  . FOOT EXAM  03/10/1958  . HEMOGLOBIN A1C  03/11/2016  . OPHTHALMOLOGY EXAM  10/23/2017 (Originally 05/13/2016)  . INFLUENZA VACCINE  12/23/2016  . MAMMOGRAM  10/14/2017  . TETANUS/TDAP  10/15/2019  . COLONOSCOPY  10/06/2025  . DEXA SCAN  Completed  . Hepatitis C Screening  Completed  . PNA vac Low Risk Adult  Completed      Plan:  I have personally reviewed and addressed the Medicare Annual Wellness questionnaire and have noted the following in the patient's chart:  A. Medical and social history B. Use of alcohol, tobacco or illicit drugs  C. Current medications and supplements D. Functional ability and status E.  Nutritional status F.  Physical activity G. Advance directives H. List of other physicians I.    Hospitalizations, surgeries, and ER visits in previous 12 months [place] such as hearing and vision if needed, cognitive and depression L. Referrals and appointments - none  In addition, I have reviewed and discussed with patient certain preventive protocols, quality metrics, and best practice recommendations. A written personalized care plan for preventive services as well as general preventive health recommendations were provided to patient.  See attached scanned questionnaire for additional information.   Signed,  Fabio Neighbors, LPN Nurse Health Advisor   MD Recommendations: Pt needs a foot exam and Hgb  A1c checked today.

## 2016-10-27 NOTE — Telephone Encounter (Signed)
Left message informing pt. Okay per DPR. Renaldo Fiddler, CMA

## 2016-10-27 NOTE — Patient Instructions (Signed)
Debrox drops for the ear wax build up over the counter  Earwax Buildup, Adult The ears produce a substance called earwax that helps keep bacteria out of the ear and protects the skin in the ear canal. Occasionally, earwax can build up in the ear and cause discomfort or hearing loss. What increases the risk? This condition is more likely to develop in people who:  Are female.  Are elderly.  Naturally produce more earwax.  Clean their ears often with cotton swabs.  Use earplugs often.  Use in-ear headphones often.  Wear hearing aids.  Have narrow ear canals.  Have earwax that is overly thick or sticky.  Have eczema.  Are dehydrated.  Have excess hair in the ear canal.  What are the signs or symptoms? Symptoms of this condition include:  Reduced or muffled hearing.  A feeling of fullness in the ear or feeling that the ear is plugged.  Fluid coming from the ear.  Ear pain.  Ear itch.  Ringing in the ear.  Coughing.  An obvious piece of earwax that can be seen inside the ear canal.  How is this diagnosed? This condition may be diagnosed based on:  Your symptoms.  Your medical history.  An ear exam. During the exam, your health care provider will look into your ear with an instrument called an otoscope.  You may have tests, including a hearing test. How is this treated? This condition may be treated by:  Using ear drops to soften the earwax.  Having the earwax removed by a health care provider. The health care provider may: ? Flush the ear with water. ? Use an instrument that has a loop on the end (curette). ? Use a suction device.  Surgery to remove the wax buildup. This may be done in severe cases.  Follow these instructions at home:  Take over-the-counter and prescription medicines only as told by your health care provider.  Do not put any objects, including cotton swabs, into your ear. You can clean the opening of your ear canal with a washcloth  or facial tissue.  Follow instructions from your health care provider about cleaning your ears. Do not over-clean your ears.  Drink enough fluid to keep your urine clear or pale yellow. This will help to thin the earwax.  Keep all follow-up visits as told by your health care provider. If earwax builds up in your ears often or if you use hearing aids, consider seeing your health care provider for routine, preventive ear cleanings. Ask your health care provider how often you should schedule your cleanings.  If you have hearing aids, clean them according to instructions from the manufacturer and your health care provider. Contact a health care provider if:  You have ear pain.  You develop a fever.  You have blood, pus, or other fluid coming from your ear.  You have hearing loss.  You have ringing in your ears that does not go away.  Your symptoms do not improve with treatment.  You feel like the room is spinning (vertigo). Summary  Earwax can build up in the ear and cause discomfort or hearing loss.  The most common symptoms of this condition include reduced or muffled hearing and a feeling of fullness in the ear or feeling that the ear is plugged.  This condition may be diagnosed based on your symptoms, your medical history, and an ear exam.  This condition may be treated by using ear drops to soften the earwax or  by having the earwax removed by a health care provider.  Do not put any objects, including cotton swabs, into your ear. You can clean the opening of your ear canal with a washcloth or facial tissue. This information is not intended to replace advice given to you by your health care provider. Make sure you discuss any questions you have with your health care provider. Document Released: 06/18/2004 Document Revised: 07/22/2016 Document Reviewed: 07/22/2016 Elsevier Interactive Patient Education  [employer].

## 2016-10-28 ENCOUNTER — Telehealth: Payer: Self-pay

## 2016-10-28 LAB — CBC WITH DIFFERENTIAL/PLATELET
BASOS ABS: 0 10*3/uL (ref 0.0–0.2)
BASOS: 1 %
EOS (ABSOLUTE): 0.2 10*3/uL (ref 0.0–0.4)
Eos: 2 %
HEMOGLOBIN: 15.4 g/dL (ref 11.1–15.9)
Hematocrit: 45.3 % (ref 34.0–46.6)
IMMATURE GRANS (ABS): 0 10*3/uL (ref 0.0–0.1)
Immature Granulocytes: 0 %
LYMPHS: 34 %
Lymphocytes Absolute: 2.8 10*3/uL (ref 0.7–3.1)
MCH: 32.5 pg (ref 26.6–33.0)
MCHC: 34 g/dL (ref 31.5–35.7)
MCV: 96 fL (ref 79–97)
MONOCYTES: 11 %
Monocytes Absolute: 0.9 10*3/uL (ref 0.1–0.9)
Neutrophils Absolute: 4.5 10*3/uL (ref 1.4–7.0)
Neutrophils: 52 %
PLATELETS: 224 10*3/uL (ref 150–379)
RBC: 4.74 x10E6/uL (ref 3.77–5.28)
RDW: 14.2 % (ref 12.3–15.4)
WBC: 8.4 10*3/uL (ref 3.4–10.8)

## 2016-10-28 LAB — COMPREHENSIVE METABOLIC PANEL
ALT: 35 IU/L — AB (ref 0–32)
AST: 33 IU/L (ref 0–40)
Albumin/Globulin Ratio: 1.8 (ref 1.2–2.2)
Albumin: 4.6 g/dL (ref 3.6–4.8)
Alkaline Phosphatase: 55 IU/L (ref 39–117)
BUN/Creatinine Ratio: 21 (ref 12–28)
BUN: 13 mg/dL (ref 8–27)
Bilirubin Total: 1 mg/dL (ref 0.0–1.2)
CALCIUM: 9.8 mg/dL (ref 8.7–10.3)
CO2: 24 mmol/L (ref 18–29)
CREATININE: 0.61 mg/dL (ref 0.57–1.00)
Chloride: 99 mmol/L (ref 96–106)
GFR calc non Af Amer: 93 mL/min/{1.73_m2} (ref >59)
GFR, EST AFRICAN AMERICAN: 108 mL/min/{1.73_m2} (ref >59)
GLUCOSE: 104 mg/dL — AB (ref 65–99)
Globulin, Total: 2.5 g/dL (ref 1.5–4.5)
Potassium: 4.4 mmol/L (ref 3.5–5.2)
Sodium: 140 mmol/L (ref 134–144)
TOTAL PROTEIN: 7.1 g/dL (ref 6.0–8.5)

## 2016-10-28 LAB — HEMOGLOBIN A1C
Est. average glucose Bld gHb Est-mCnc: 120 mg/dL
HEMOGLOBIN A1C: 5.8 % — AB (ref 4.8–5.6)

## 2016-10-28 LAB — LIPID PANEL
CHOL/HDL RATIO: 3.3 ratio (ref 0.0–4.4)
Cholesterol, Total: 204 mg/dL — ABNORMAL HIGH (ref 100–199)
HDL: 62 mg/dL (ref >39)
LDL CALC: 116 mg/dL — AB (ref 0–99)
Triglycerides: 128 mg/dL (ref 0–149)
VLDL CHOLESTEROL CAL: 26 mg/dL (ref 5–40)

## 2016-10-28 NOTE — Telephone Encounter (Signed)
-----   Message from Mar Daring, PA-C sent at 10/28/2016 10:11 AM EDT ----- Cholesterol up slightly from labs last year. A1c has improved from 6.6 to 5.8 which is fantastic. All other labs are stable and WNL.

## 2016-10-28 NOTE — Telephone Encounter (Signed)
Pt is returning call.  CB#[phone]/MW

## 2016-10-28 NOTE — Telephone Encounter (Signed)
Patient advised as directed below.  Thanks,  -Joseline 

## 2016-10-28 NOTE — Telephone Encounter (Signed)
LMTCB-Patient is active in [place]. LM to call back if any questions or concerns.  Thanks,  -Joseline

## 2016-10-29 ENCOUNTER — Other Ambulatory Visit: Payer: Self-pay | Admitting: Physician Assistant

## 2016-10-29 ENCOUNTER — Telehealth: Payer: Self-pay

## 2016-10-29 DIAGNOSIS — I10 Essential (primary) hypertension: Secondary | ICD-10-CM

## 2016-10-29 MED ORDER — HYDROCHLOROTHIAZIDE 25 MG PO TABS: 25.0000 mg | ORAL_TABLET | Freq: Every day | ORAL | 3 refills | Status: DC

## 2016-10-29 MED ORDER — LISINOPRIL 20 MG PO TABS: 20.0000 mg | ORAL_TABLET | Freq: Every day | ORAL | 3 refills | Status: DC

## 2016-10-29 NOTE — Telephone Encounter (Signed)
Pt contacted office for refill request on the following medications:  [facility] mail order.  90 day supply. CB#[phone]/MW  hydrochlorothiazide (HYDRODIURIL) 25 MG tablet  lisinopril (PRINIVIL,ZESTRIL) 20 MG tablet

## 2016-10-29 NOTE — Telephone Encounter (Signed)
Patient advised as below.  

## 2016-10-29 NOTE — Telephone Encounter (Signed)
-----   Message from Mar Daring, PA-C sent at 10/28/2016 10:11 AM EDT ----- Cholesterol up slightly from labs last year. A1c has improved from 6.6 to 5.8 which is fantastic. All other labs are stable and WNL.

## 2016-11-03 ENCOUNTER — Ambulatory Visit (INDEPENDENT_AMBULATORY_CARE_PROVIDER_SITE_OTHER): Payer: Medicare Other | Admitting: Physician Assistant

## 2016-11-03 ENCOUNTER — Encounter: Payer: Self-pay | Admitting: Physician Assistant

## 2016-11-03 VITALS — BP 146/76 | HR 58 | Temp 98.0°F | Resp 16 | Wt 175.0 lb

## 2016-11-03 DIAGNOSIS — H6123 Impacted cerumen, bilateral: Secondary | ICD-10-CM

## 2016-11-03 NOTE — Progress Notes (Signed)
Patient: Nicole Tucker Female    DOB: 21-Dec-1945   71 y.o.   MRN: [id number] Visit Date: 11/03/2016  Today's Provider: Mar Daring, PA-C   Chief Complaint  Patient presents with  . Ear Problem   Subjective:    HPI Patient here today C/O left ear being stopped up for 5 days. Patient reports using ear drops for ear wax for 2 days. Patient reports she not being able to hear from left ear.     Allergies  Allergen Reactions  . Codeine Itching and Anxiety     Current Outpatient Prescriptions:  .  Biotin 10000 MCG TABS, Take by mouth 2 (two) times daily., Disp: , Rfl:  .  Cholecalciferol (VITAMIN D) 2000 units CAPS, Take by mouth., Disp: , Rfl:  .  Cinnamon Bark POWD, by Does not apply route., Disp: , Rfl:  .  Coenzyme Q10 (CO Q10) 200 MG CAPS, Take by mouth., Disp: , Rfl:  .  Ginkgo Biloba 40 MG TABS, Take 120 mg by mouth., Disp: , Rfl:  .  Gymnema Sylvestris Leaf POWD, by Does not apply route., Disp: , Rfl:  .  hydrochlorothiazide (HYDRODIURIL) 25 MG tablet, Take 1 tablet (25 mg total) by mouth daily., Disp: 90 tablet, Rfl: 3 .  lisinopril (PRINIVIL,ZESTRIL) 20 MG tablet, Take 1 tablet (20 mg total) by mouth daily., Disp: 90 tablet, Rfl: 3 .  milk thistle 175 MG tablet, Take 175 mg by mouth. , Disp: , Rfl:  .  Multiple Vitamin (MULTIVITAMIN WITH MINERALS) TABS tablet, Take 1 tablet by mouth daily., Disp: , Rfl:  .  omega-3 acid ethyl esters (LOVAZA) 1 g capsule, Take 1 g by mouth daily., Disp: , Rfl:   Review of Systems  Constitutional: Negative.   HENT: Positive for hearing loss.        Left ear stopped up  Cardiovascular: Negative.     Social History  Substance Use Topics  . Smoking status: Former Smoker    Types: Cigarettes    Quit date: 11/29/1990  . Smokeless tobacco: Never Used     Comment: quit 1992  . Alcohol use 3.0 oz/week    5 Glasses of wine per week   Objective:   BP (!) 146/76 (BP Location: Left Arm, Patient Position: Sitting, Cuff Size:  Large)   Pulse (!) 58   Temp 98 F (36.7 C) (Oral)   Resp 16   Wt 175 lb (79.4 kg)   SpO2 98%   BMI 31.00 kg/m  Vitals:   11/03/16 0947  BP: (!) 146/76  Pulse: (!) 58  Resp: 16  Temp: 98 F (36.7 C)  TempSrc: Oral  SpO2: 98%  Weight: 175 lb (79.4 kg)     Physical Exam  Constitutional: She appears well-developed and well-nourished. No distress.  HENT:  Head: Normocephalic and atraumatic.  Right Ear: Hearing, tympanic membrane, external ear and ear canal normal.  Left Ear: Hearing, tympanic membrane, external ear and ear canal normal.  Nose: Nose normal.  Mouth/Throat: Uvula is midline, oropharynx is clear and moist and mucous membranes are normal. No oropharyngeal exudate.  Cerumen impaction noted bilaterally. Ear lavage performed and was successful bilaterally. TM visualized and are normal.  Eyes: Conjunctivae are normal. Pupils are equal, round, and reactive to light.  Skin: Skin is warm and dry. She is not diaphoretic.  Vitals reviewed.       Assessment & Plan:     1. Bilateral impacted cerumen Successful ear lavage.  Continue debrox drops at home to keep wax soft. Call if symptoms return. - Ear Lavage       Mar Daring, PA-C  [facility]

## 2016-11-03 NOTE — Patient Instructions (Signed)
Earwax Buildup, Adult The ears produce a substance called earwax that helps keep bacteria out of the ear and protects the skin in the ear canal. Occasionally, earwax can build up in the ear and cause discomfort or hearing loss. What increases the risk? This condition is more likely to develop in people who:  Are female.  Are elderly.  Naturally produce more earwax.  Clean their ears often with cotton swabs.  Use earplugs often.  Use in-ear headphones often.  Wear hearing aids.  Have narrow ear canals.  Have earwax that is overly thick or sticky.  Have eczema.  Are dehydrated.  Have excess hair in the ear canal.  What are the signs or symptoms? Symptoms of this condition include:  Reduced or muffled hearing.  A feeling of fullness in the ear or feeling that the ear is plugged.  Fluid coming from the ear.  Ear pain.  Ear itch.  Ringing in the ear.  Coughing.  An obvious piece of earwax that can be seen inside the ear canal.  How is this diagnosed? This condition may be diagnosed based on:  Your symptoms.  Your medical history.  An ear exam. During the exam, your health care provider will look into your ear with an instrument called an otoscope.  You may have tests, including a hearing test. How is this treated? This condition may be treated by:  Using ear drops to soften the earwax.  Having the earwax removed by a health care provider. The health care provider may: ? Flush the ear with water. ? Use an instrument that has a loop on the end (curette). ? Use a suction device.  Surgery to remove the wax buildup. This may be done in severe cases.  Follow these instructions at home:  Take over-the-counter and prescription medicines only as told by your health care provider.  Do not put any objects, including cotton swabs, into your ear. You can clean the opening of your ear canal with a washcloth or facial tissue.  Follow instructions from your health  care provider about cleaning your ears. Do not over-clean your ears.  Drink enough fluid to keep your urine clear or pale yellow. This will help to thin the earwax.  Keep all follow-up visits as told by your health care provider. If earwax builds up in your ears often or if you use hearing aids, consider seeing your health care provider for routine, preventive ear cleanings. Ask your health care provider how often you should schedule your cleanings.  If you have hearing aids, clean them according to instructions from the manufacturer and your health care provider. Contact a health care provider if:  You have ear pain.  You develop a fever.  You have blood, pus, or other fluid coming from your ear.  You have hearing loss.  You have ringing in your ears that does not go away.  Your symptoms do not improve with treatment.  You feel like the room is spinning (vertigo). Summary  Earwax can build up in the ear and cause discomfort or hearing loss.  The most common symptoms of this condition include reduced or muffled hearing and a feeling of fullness in the ear or feeling that the ear is plugged.  This condition may be diagnosed based on your symptoms, your medical history, and an ear exam.  This condition may be treated by using ear drops to soften the earwax or by having the earwax removed by a health care provider.  Do   not put any objects, including cotton swabs, into your ear. You can clean the opening of your ear canal with a washcloth or facial tissue. This information is not intended to replace advice given to you by your health care provider. Make sure you discuss any questions you have with your health care provider. Document Released: 06/18/2004 Document Revised: 07/22/2016 Document Reviewed: 07/22/2016 Elsevier Interactive Patient Education  2018 [employer]  

## 2016-11-06 ENCOUNTER — Ambulatory Visit
Admission: RE | Admit: 2016-11-06 | Discharge: 2016-11-06 | Disposition: A | Discharge status: Home / Self Care | Payer: Medicare Other | Source: Ambulatory Visit | Attending: Physician Assistant | Admitting: Physician Assistant

## 2016-11-06 DIAGNOSIS — R0989 Other specified symptoms and signs involving the circulatory and respiratory systems: Secondary | ICD-10-CM | POA: Diagnosis not present

## 2016-11-06 DIAGNOSIS — I6523 Occlusion and stenosis of bilateral carotid arteries: Secondary | ICD-10-CM | POA: Diagnosis not present

## 2016-11-09 ENCOUNTER — Telehealth: Payer: Self-pay | Admitting: Physician Assistant

## 2016-11-09 DIAGNOSIS — I6523 Occlusion and stenosis of bilateral carotid arteries: Secondary | ICD-10-CM

## 2016-11-09 NOTE — Telephone Encounter (Signed)
-----   Message from [place] sent at 11/09/2016  9:36 AM EDT ----- Patient advised of results.  She wishes to proceed with the referral to vascular surgery, she does not wish to do the cholesterol medication at this time.  She also states that she is on Ginko for memory but states it is also a mild blood thinner and questions if she should still take the ASA with it. Will you place order for referral or would you like me to do this.   Thanks [employer]

## 2016-11-09 NOTE — Telephone Encounter (Signed)
Advised  ED 

## 2016-11-09 NOTE — Telephone Encounter (Signed)
Referral placed. And I think an 81mg  ASA would still be safe to take as well.

## 2016-11-16 ENCOUNTER — Other Ambulatory Visit: Payer: Self-pay

## 2016-11-16 DIAGNOSIS — I10 Essential (primary) hypertension: Secondary | ICD-10-CM

## 2016-11-16 MED ORDER — LISINOPRIL 20 MG PO TABS: 20.0000 mg | ORAL_TABLET | Freq: Every day | ORAL | 3 refills | Status: DC

## 2016-11-16 MED ORDER — HYDROCHLOROTHIAZIDE 25 MG PO TABS: 25.0000 mg | ORAL_TABLET | Freq: Every day | ORAL | 3 refills | Status: DC

## 2016-11-16 NOTE — Telephone Encounter (Signed)
Patient called saying that [facility] never received her prescriptions for HCTZ and Lisinopril. Could you resend this into the pharmacy? Please advise. Thanks!

## 2016-11-19 ENCOUNTER — Ambulatory Visit (INDEPENDENT_AMBULATORY_CARE_PROVIDER_SITE_OTHER): Payer: Medicare Other | Admitting: Vascular Surgery

## 2016-11-19 ENCOUNTER — Encounter (INDEPENDENT_AMBULATORY_CARE_PROVIDER_SITE_OTHER): Payer: Self-pay | Admitting: Vascular Surgery

## 2016-11-19 VITALS — BP 165/85 | HR 63 | Resp 16 | Ht 63.0 in | Wt 174.0 lb

## 2016-11-19 DIAGNOSIS — R7303 Prediabetes: Secondary | ICD-10-CM

## 2016-11-19 DIAGNOSIS — I10 Essential (primary) hypertension: Secondary | ICD-10-CM | POA: Diagnosis not present

## 2016-11-19 DIAGNOSIS — E7800 Pure hypercholesterolemia, unspecified: Secondary | ICD-10-CM

## 2016-11-19 DIAGNOSIS — I6523 Occlusion and stenosis of bilateral carotid arteries: Secondary | ICD-10-CM | POA: Insufficient documentation

## 2016-11-19 NOTE — Progress Notes (Signed)
Subjective:    Patient ID: Nicole Tucker, female    DOB: [birth year], 71 y.o.   MRN: [id number] Chief Complaint  Patient presents with  . New Evaluation    Left bruit   Presents as a new patient referred by Dr. Tollie Pizza for carotid artery stenosis. Patient was found to have a bruit on exam. She underwent a carotid artery duplex on 11/06/2016 which was notable for a 50-69% stenosis of the right internal carotid artery with less than 50% stenosis in the left internal carotid artery. Bilateral vertebrals were antegrade. The patient denies experiencing Amaurosis Fugax, TIA like symptoms or focal motor deficits. The patient currently takes aspirin 81 mg. The patient is uncomfortable taking a statin due to the possible side effects. Patient denies any fever, nausea or vomiting.   Review of Systems  Constitutional: Negative.   HENT: Negative.   Eyes: Negative.   Respiratory: Negative.   Cardiovascular: Negative.   Gastrointestinal: Negative.   Endocrine: Negative.   Genitourinary: Negative.   Musculoskeletal: Negative.   Skin: Negative.   Allergic/Immunologic: Negative.   Neurological: Negative.   Hematological: Negative.   Psychiatric/Behavioral: Negative.       Objective:   Physical Exam  Constitutional: She is oriented to person, place, and time. She appears well-developed and well-nourished. No distress.  HENT:  Head: Normocephalic and atraumatic.  Eyes: Pupils are equal, round, and reactive to light.  Neck: Normal range of motion.  Mild right carotid bruit noted  Cardiovascular: Normal rate, regular rhythm, normal heart sounds and intact distal pulses.   Pulses:      Radial pulses are 2+ on the right side, and 2+ on the left side.  Pulmonary/Chest: Effort normal.  Musculoskeletal: Normal range of motion. She exhibits no edema.  Neurological: She is alert and oriented to person, place, and time.  Skin: Skin is warm and dry. She is not diaphoretic.  Psychiatric: She has a normal  mood and affect. Her behavior is normal. Judgment and thought content normal.  Vitals reviewed.  BP (!) 165/85 (BP Location: Right Arm)   Pulse 63   Resp 16   Ht 5\' 3"  (1.6 m)   Wt 174 lb (78.9 kg)   BMI 30.82 kg/m   Past Medical History:  Diagnosis Date  . Arthritis   . Diabetes mellitus without complication ([place])    New DX - no meds yet  . Diverticular disease   . Hypertension    Social History   Social History  . Marital status: Divorced    Spouse name: N/A  . Number of children: N/A  . Years of education: N/A   Occupational History  . Not on file.   Social History Main Topics  . Smoking status: Former Smoker    Types: Cigarettes    Quit date: 11/29/1990  . Smokeless tobacco: Never Used     Comment: quit 1992  . Alcohol use 3.0 oz/week    5 Glasses of wine per week  . Drug use: No  . Sexual activity: Not on file   Other Topics Concern  . Not on file   Social History Narrative  . No narrative on file   Past Surgical History:  Procedure Laterality Date  . CATARACT EXTRACTION    . COLONOSCOPY N/A 10/07/2015   Procedure: COLONOSCOPY;  Surgeon: Lucilla Lame, MD;  Location: [place];  Service: Endoscopy;  Laterality: N/A;  . DILATION AND CURETTAGE OF UTERUS    . LAPAROSCOPIC SIGMOID COLECTOMY N/A 11/15/2015  Procedure: LAPAROSCOPIC SIGMOID COLECTOMY possible open, possible colostomy;  Surgeon: Jules Husbands, MD;  Location: [facility];  Service: General;  Laterality: N/A;  Please have Hand port ( gelport green by [employer] [facility]) ready  . TUBAL LIGATION     Family History  Problem Relation Age of Onset  . Heart disease Mother   . Hypertension Mother   . Aneurysm Mother   . Alcohol abuse Father   . Bladder Cancer Sister   . Hypertension Sister   . Breast cancer Sister 67  . Hypertension Sister   . Diverticulitis Brother   . Hypertension Brother   . Hypertension Sister   . Diabetes Paternal Grandmother   . Heart disease Paternal Grandmother     Allergies  Allergen Reactions  . Codeine Itching and Anxiety      Assessment & Plan:  Presents as a new patient referred by Dr. Tollie Pizza for carotid artery stenosis. Patient was found to have a bruit on exam. She underwent a carotid artery duplex on 11/06/2016 which was notable for a 50-69% stenosis of the right internal carotid artery with less than 50% stenosis in the left internal carotid artery. Bilateral vertebrals were antegrade. The patient denies experiencing Amaurosis Fugax, TIA like symptoms or focal motor deficits. The patient currently takes aspirin 81 mg. The patient is uncomfortable taking a statin due to the possible side effects. Patient denies any fever, nausea or vomiting.  1. Bilateral carotid artery stenosis - New Studies reviewed with patient. Patient asymptomatic with stable duplex.  No intervention at this time.  Patient to return in six months for surveillance carotid duplex. Patient to remain abstinent of tobacco use. I have discussed with the patient at length the risk factors for and pathogenesis of atherosclerotic disease and encouraged a healthy diet, regular exercise regimen and blood pressure / glucose control.  Patient was instructed to contact our office in the interim with problems such as arm / leg weakness or numbness, speech / swallowing difficulty or temporary monocular blindness. The patient expresses their understanding.   - VAS US CAROTID; Future  2. Benign essential HTN - stable Encouraged good control as its slows the progression of atherosclerotic disease  3. Borderline diabetes - stable Encouraged good control as its slows the progression of atherosclerotic disease  4. Hypercholesterolemia without hypertriglyceridemia - stable Encouraged good control as its slows the progression of atherosclerotic disease  Current Outpatient Prescriptions on File Prior to Visit  Medication Sig Dispense Refill  . Biotin 10000 MCG TABS Take by mouth 2 (two)  times daily.    . Cholecalciferol (VITAMIN D) 2000 units CAPS Take by mouth.    Wallace Cullens POWD by Does not apply route.    . Coenzyme Q10 (CO Q10) 200 MG CAPS Take by mouth.    . Ginkgo Biloba 40 MG TABS Take 120 mg by mouth.    Haydee Salter Leaf POWD by Does not apply route.    . hydrochlorothiazide (HYDRODIURIL) 25 MG tablet Take 1 tablet (25 mg total) by mouth daily. 90 tablet 3  . lisinopril (PRINIVIL,ZESTRIL) 20 MG tablet Take 1 tablet (20 mg total) by mouth daily. 90 tablet 3  . milk thistle 175 MG tablet Take 175 mg by mouth.     . Multiple Vitamin (MULTIVITAMIN WITH MINERALS) TABS tablet Take 1 tablet by mouth daily.    Marland Kitchen omega-3 acid ethyl esters (LOVAZA) 1 g capsule Take 1 g by mouth daily.     No current facility-administered medications  on file prior to visit.     There are no Patient Instructions on file for this visit. No Follow-up on file.   Lashon Beringer A Archana Eckman, PA-C

## 2016-12-07 ENCOUNTER — Ambulatory Visit (INDEPENDENT_AMBULATORY_CARE_PROVIDER_SITE_OTHER): Payer: Medicare Other | Admitting: Physician Assistant

## 2016-12-07 ENCOUNTER — Encounter: Payer: Self-pay | Admitting: Physician Assistant

## 2016-12-07 VITALS — BP 128/70 | HR 64 | Temp 98.3°F | Resp 16 | Wt 173.0 lb

## 2016-12-07 DIAGNOSIS — R3915 Urgency of urination: Secondary | ICD-10-CM

## 2016-12-07 DIAGNOSIS — N200 Calculus of kidney: Secondary | ICD-10-CM

## 2016-12-07 DIAGNOSIS — R1030 Lower abdominal pain, unspecified: Secondary | ICD-10-CM | POA: Diagnosis not present

## 2016-12-07 DIAGNOSIS — I6523 Occlusion and stenosis of bilateral carotid arteries: Secondary | ICD-10-CM

## 2016-12-07 DIAGNOSIS — Z8052 Family history of malignant neoplasm of bladder: Secondary | ICD-10-CM | POA: Diagnosis not present

## 2016-12-07 LAB — POCT URINALYSIS DIPSTICK
Bilirubin, UA: NEGATIVE
Glucose, UA: NEGATIVE
Ketones, UA: NEGATIVE
Leukocytes, UA: NEGATIVE
NITRITE UA: NEGATIVE
PH UA: 6 (ref 5.0–8.0)
Protein, UA: NEGATIVE
SPEC GRAV UA: 1.01 (ref 1.010–1.025)
UROBILINOGEN UA: 0.2 U/dL

## 2016-12-07 NOTE — Progress Notes (Signed)
Patient: Nicole Tucker Female    DOB: 04/19/1946   71 y.o.   MRN: [id number] Visit Date: 12/07/2016  Today's Provider: Mar Daring, PA-C   Chief Complaint  Patient presents with  . Urinary Tract Infection   Subjective:    Urinary Tract Infection   This is a new problem. The current episode started in the past 7 days. There has been no fever. Associated symptoms include frequency. Pertinent negatives include no chills, discharge, hematuria, urgency or vomiting. She has tried increased fluids for the symptoms. Her past medical history is significant for kidney stones.      Allergies  Allergen Reactions  . Codeine Itching and Anxiety     Current Outpatient Prescriptions:  .  Biotin 10000 MCG TABS, Take by mouth 2 (two) times daily., Disp: , Rfl:  .  Cholecalciferol (VITAMIN D) 2000 units CAPS, Take by mouth., Disp: , Rfl:  .  Cinnamon Bark POWD, by Does not apply route., Disp: , Rfl:  .  Coenzyme Q10 (CO Q10) 200 MG CAPS, Take by mouth., Disp: , Rfl:  .  Ginkgo Biloba 40 MG TABS, Take 120 mg by mouth., Disp: , Rfl:  .  Gymnema Sylvestris Leaf POWD, by Does not apply route., Disp: , Rfl:  .  hydrochlorothiazide (HYDRODIURIL) 25 MG tablet, Take 1 tablet (25 mg total) by mouth daily., Disp: 90 tablet, Rfl: 3 .  lisinopril (PRINIVIL,ZESTRIL) 20 MG tablet, Take 1 tablet (20 mg total) by mouth daily., Disp: 90 tablet, Rfl: 3 .  milk thistle 175 MG tablet, Take 175 mg by mouth. , Disp: , Rfl:  .  Multiple Vitamin (MULTIVITAMIN WITH MINERALS) TABS tablet, Take 1 tablet by mouth daily., Disp: , Rfl:  .  omega-3 acid ethyl esters (LOVAZA) 1 g capsule, Take 1 g by mouth daily., Disp: , Rfl:  .  TURMERIC PO, Take by mouth., Disp: , Rfl:  .  VITAMIN E PO, Take by mouth., Disp: , Rfl:   Review of Systems  Constitutional: Negative for chills, fatigue and fever.  Respiratory: Negative for cough, chest tightness and shortness of breath.   Cardiovascular: Negative for chest pain,  palpitations and leg swelling.  Gastrointestinal: Positive for abdominal pain (Lower abdomen). Negative for vomiting.  Genitourinary: Positive for frequency. Negative for dysuria, hematuria, pelvic pain, urgency and vaginal pain.  Musculoskeletal: Positive for back pain.    Social History  Substance Use Topics  . Smoking status: Former Smoker    Types: Cigarettes    Quit date: 11/29/1990  . Smokeless tobacco: Never Used     Comment: quit 1992  . Alcohol use 3.0 oz/week    5 Glasses of wine per week   Objective:   BP 128/70 (BP Location: Right Arm, Patient Position: Sitting, Cuff Size: Normal)   Pulse 64   Temp 98.3 F (36.8 C) (Oral)   Resp 16   Wt 173 lb (78.5 kg)   SpO2 98%   BMI 30.65 kg/m  Vitals:   12/07/16 1453  BP: 128/70  Pulse: 64  Resp: 16  Temp: 98.3 F (36.8 C)  TempSrc: Oral  SpO2: 98%  Weight: 173 lb (78.5 kg)     Physical Exam  Constitutional: She is oriented to person, place, and time. She appears well-developed and well-nourished. No distress.  Cardiovascular: Normal rate, regular rhythm and normal heart sounds.  Exam reveals no gallop and no friction rub.   No murmur heard. Pulmonary/Chest: Effort normal and breath sounds normal.  No respiratory distress. She has no wheezes. She has no rales.  Abdominal: Soft. Normal appearance and bowel sounds are normal. She exhibits no distension and no mass. There is no hepatosplenomegaly. There is no tenderness. There is no rebound, no guarding and no CVA tenderness.  Neurological: She is alert and oriented to person, place, and time.  Skin: Skin is warm and dry. She is not diaphoretic.  Vitals reviewed.       Assessment & Plan:     1. Renal stone UA was normal with exception of microscopic hematuria. Suspect ureteral stone as patient has been having waxing and waning pain that has been moving distally. She did also have some low back pain as well. Urinary frequency and urgency have been the other 2  complaints she has. She does have history of recurrent stones, one that required lithotripsy. Most recent CT in 2017 showed a 80mm stone in the left kidney. She has been able to control pain with IBU. Patient refused flomax. Continue to push fluids. Referral made to [place] for patient to establish locally with a urologist.  - Ambulatory referral to Urology  2. Lower abdominal pain See above medical treatment plan. - POCT Urinalysis Dipstick  3. Urgency of urination See above medical treatment plan. - POCT Urinalysis Dipstick  4. Family history of bladder cancer Bladder cancer in sister that required complete cystectomy for which now she has a urostomy. - Ambulatory referral to Urology       Mar Daring, PA-C  [facility]

## 2016-12-08 ENCOUNTER — Ambulatory Visit
Admission: RE | Admit: 2016-12-08 | Discharge: 2016-12-08 | Disposition: A | Discharge status: Home / Self Care | Payer: Medicare Other | Source: Ambulatory Visit | Attending: Urology | Admitting: Urology

## 2016-12-08 ENCOUNTER — Other Ambulatory Visit: Payer: Self-pay

## 2016-12-08 ENCOUNTER — Other Ambulatory Visit: Payer: Self-pay | Admitting: Urology

## 2016-12-08 DIAGNOSIS — N200 Calculus of kidney: Secondary | ICD-10-CM

## 2016-12-09 ENCOUNTER — Telehealth: Payer: Self-pay | Admitting: Physician Assistant

## 2016-12-09 ENCOUNTER — Encounter: Payer: Self-pay | Admitting: Urology

## 2016-12-09 ENCOUNTER — Ambulatory Visit (INDEPENDENT_AMBULATORY_CARE_PROVIDER_SITE_OTHER): Payer: Medicare Other | Admitting: Urology

## 2016-12-09 VITALS — BP 150/73 | HR 67 | Ht 63.0 in | Wt 172.0 lb

## 2016-12-09 DIAGNOSIS — R350 Frequency of micturition: Secondary | ICD-10-CM | POA: Diagnosis not present

## 2016-12-09 DIAGNOSIS — Z87448 Personal history of other diseases of urinary system: Secondary | ICD-10-CM | POA: Diagnosis not present

## 2016-12-09 DIAGNOSIS — N200 Calculus of kidney: Secondary | ICD-10-CM

## 2016-12-09 DIAGNOSIS — K5732 Diverticulitis of large intestine without perforation or abscess without bleeding: Secondary | ICD-10-CM

## 2016-12-09 DIAGNOSIS — I6523 Occlusion and stenosis of bilateral carotid arteries: Secondary | ICD-10-CM

## 2016-12-09 DIAGNOSIS — R1030 Lower abdominal pain, unspecified: Secondary | ICD-10-CM

## 2016-12-09 LAB — URINALYSIS, COMPLETE
BILIRUBIN UA: NEGATIVE
Glucose, UA: NEGATIVE
Ketones, UA: NEGATIVE
Nitrite, UA: NEGATIVE
PH UA: 7.5 (ref 5.0–7.5)
Protein, UA: NEGATIVE
Specific Gravity, UA: 1.015 (ref 1.005–1.030)
Urobilinogen, Ur: 0.2 mg/dL (ref 0.2–1.0)

## 2016-12-09 LAB — MICROSCOPIC EXAMINATION: BACTERIA UA: NONE SEEN

## 2016-12-09 NOTE — Progress Notes (Signed)
12/09/2016 10:56 AM   [name] Jul 16, 1945 [id number]  Referring provider: Mar Daring, PA-C [address] [place], [place] [address]  Chief Complaint  Patient presents with  . Nephrolithiasis    New Patient    HPI: 71 year old female referred for further evaluation of bilateral pelvic pain which started ~4 days ago. She describes the pain is constant which waxes and wanes.   Occasionally, she has a sharp pain which last for a few seconds and then resolves. She's never had this pain before. It is not exacerbated by activity, bladder filling, her voiding.  Overall, the penis during to improve a little but is not completely resolved. She also has some mild back pain which started yesterday in her low left back.   She denies any urinary symptoms or deviation from her baseline. She does have some mild baseline urinary frequency and urgency which is not bothersome to her. No gross hematuria.  UA 2 days ago at her PCPs with a dip with 3+ blood but no microscopic analysis was done. Repeat urinalysis today shows no evidence of microscopic blood. Her UA is negative.  KUB today shows stable unchanged left nonobstructing stone.  She was previously a patient of Dr. Jacqlyn Larsen. She was last seen in 2015 for workup of microscopic hematuria. At that time, she was found to have a 6 mm left midpole stone which had been stable in size since 2013.  Cystoscopy was unremarkable.  She does have family history of bladder cancer, sister is s/p cystectomy   PMH: Past Medical History:  Diagnosis Date  . Arthritis   . Diabetes mellitus without complication ([place])    New DX - no meds yet  . Diverticular disease   . Hypertension     Surgical History: Past Surgical History:  Procedure Laterality Date  . CATARACT EXTRACTION    . COLONOSCOPY N/A 10/07/2015   Procedure: COLONOSCOPY;  Surgeon: Lucilla Lame, MD;  Location: [place];  Service: Endoscopy;  Laterality: N/A;  .  DILATION AND CURETTAGE OF UTERUS    . LAPAROSCOPIC SIGMOID COLECTOMY N/A 11/15/2015   Procedure: LAPAROSCOPIC SIGMOID COLECTOMY possible open, possible colostomy;  Surgeon: Jules Husbands, MD;  Location: [facility];  Service: General;  Laterality: N/A;  Please have Hand port ( gelport green by [facility]) ready  . TUBAL LIGATION      Home Medications:  Allergies as of 12/09/2016      Reactions   Codeine Itching, Anxiety      Medication List       Accurate as of 12/09/16 10:56 AM. Always use your most recent med list.          Biotin 10000 MCG Tabs Take by mouth 2 (two) times daily.   Cinnamon Bark Powd by Does not apply route.   Co Q10 200 MG Caps Take by mouth.   Ginkgo Biloba 40 MG Tabs Take 120 mg by mouth.   Gymnema Sylvestris Leaf Powd by Does not apply route.   hydrochlorothiazide 25 MG tablet Commonly known as:  HYDRODIURIL Take 1 tablet (25 mg total) by mouth daily.   lisinopril 20 MG tablet Commonly known as:  PRINIVIL,ZESTRIL Take 1 tablet (20 mg total) by mouth daily.   milk thistle 175 MG tablet Take 175 mg by mouth.   multivitamin with minerals Tabs tablet Take 1 tablet by mouth daily.   omega-3 acid ethyl esters 1 g capsule Commonly known as:  LOVAZA Take 1 g by mouth daily.  TURMERIC PO Take by mouth.   Vitamin D 2000 units Caps Take by mouth.   VITAMIN E PO Take by mouth.       Allergies:  Allergies  Allergen Reactions  . Codeine Itching and Anxiety    Family History: Family History  Problem Relation Age of Onset  . Heart disease Mother   . Hypertension Mother   . Aneurysm Mother   . Alcohol abuse Father   . Bladder Cancer Sister   . Hypertension Sister   . Breast cancer Sister 80  . Hypertension Sister   . Diverticulitis Brother   . Hypertension Brother   . Hypertension Sister   . Diabetes Paternal Grandmother   . Heart disease Paternal Grandmother     Social History:  reports that she quit smoking about 26  years ago. Her smoking use included Cigarettes. She has never used smokeless tobacco. She reports that she drinks about 3.0 oz of alcohol per week . She reports that she does not use drugs.  ROS: UROLOGY Frequent Urination?: No Hard to postpone urination?: No Burning/pain with urination?: No Get up at night to urinate?: Yes Leakage of urine?: No Urine stream starts and stops?: No Trouble starting stream?: No Do you have to strain to urinate?: No Blood in urine?: Yes Urinary tract infection?: No Sexually transmitted disease?: No Injury to kidneys or bladder?: No Painful intercourse?: No Weak stream?: No Currently pregnant?: No Vaginal bleeding?: No Last menstrual period?: n  Gastrointestinal Nausea?: No Vomiting?: No Indigestion/heartburn?: No Diarrhea?: No Constipation?: No  Constitutional Fever: No Night sweats?: No Weight loss?: No Fatigue?: No  Skin Skin rash/lesions?: No Itching?: No  Eyes Blurred vision?: Yes Double vision?: No  Ears/Nose/Throat Sore throat?: No Sinus problems?: No  Hematologic/Lymphatic Swollen glands?: No Easy bruising?: No  Cardiovascular Leg swelling?: No Chest pain?: No  Respiratory Cough?: No Shortness of breath?: No  Endocrine Excessive thirst?: No  Musculoskeletal Back pain?: No Joint pain?: No  Neurological Headaches?: No Dizziness?: No  Psychologic Depression?: No Anxiety?: No  Physical Exam: BP (!) 150/73   Pulse 67   Ht 5\' 3"  (1.6 m)   Wt 172 lb (78 kg)   BMI 30.47 kg/m   Constitutional:  Alert and oriented, No acute distress. HEENT: [place] AT, moist mucus membranes.  Trachea midline, no masses. Cardiovascular: No clubbing, cyanosis, or edema. Respiratory: Normal respiratory effort, no increased work of breathing. GI: Abdomen is soft, nondistended, no abdominal masses.  Mild tenderness in the left and right lower quadrants with voluntary guarding. No rebound.  No midline lower abdominal pain.  Obese.     GU: No CVA tenderness.  Skin: No rashes, bruises or suspicious lesions. Neurologic: Grossly intact, no focal deficits, moving all 4 extremities. Psychiatric: Normal mood and affect.  Laboratory Data: Lab Results  Component Value Date   WBC 8.4 10/27/2016   HGB 15.4 10/27/2016   HCT 45.3 10/27/2016   MCV 96 10/27/2016   PLT 224 10/27/2016    Lab Results  Component Value Date   CREATININE 0.61 10/27/2016    Lab Results  Component Value Date   HGBA1C 5.8 (H) 10/27/2016    Urinalysis See epic, reviewed  Pertinent Imaging: CLINICAL DATA:  History of renal stone disease.  EXAM: ABDOMEN - 1 VIEW  COMPARISON:  CT 08/29/2016, 12/26/2011.  FINDINGS: Left nephrolithiasis again noted. Pelvic calcifications noted most consistent phleboliths. No bowel distention. Degenerative changes lumbar spine and both hips. Surgical sutures in the pelvis.  IMPRESSION: Left nephrolithiasis again  noted.  No evidence of urolithiasis.   Electronically Signed   By: Marcello Moores  Register   On: 12/08/2016 15:19  KUB is personally reviewed today. This is compared to previous CT scan abdomen pelvis with contrast on 08/2015.  Assessment & Plan:   1. Calculus of kidney KUB shows unchanged 6 mm left midpole stone, stable since at least 2013 Given that the stone is relatively stable in size and asymptomatic, I would not recommend any intervention - Urinalysis, Complete  2. Lower abdominal pain Lower abdominal pain, improving Given the location and nature of the pain as well as negative urinalysis and KUB, I do not feel that this is likely related to her bladder or history of kidney stones DDx discussed I have urged her to follow-up with her primary care physician  3. History of hematuria No microscopic blood on urinalysis today Status post negative hematuria workup by Dr. Jacqlyn Larsen 2015 including cystoscopy I advised her to have her PCP check her urine again in 3-6 months, if there is  evidence of mixed cup of blood, recommend pursuing cystoscopy essentially given her family history  4. Urinary frequency Baseline daytime urinary frequency, minimal bother Behavioral modification discussed    F/u prn  Hollice Espy, MD  [facility] [address] [place], [place] [address] [phone]

## 2016-12-09 NOTE — Telephone Encounter (Signed)
Pt is requesting for Tawanna Sat to return her call to discuss what the next step is since she saw the urologist. Please advise. Thanks TNP

## 2016-12-10 NOTE — Telephone Encounter (Signed)
Patient has been having lower abdominal pain radiating through the entire lower abdomen. No fever, no nausea or vomiting. Has diarrhea at baseline but no changes. No melena or hematochezia. Was felt to be a possible renal stone (has history) since pain was waxing and waning. Had Urology work up which was negative. Patient reports pain is improving but she has had a partial colectomy secondary to diverticulitis in 2017 and would like to establish/get opinion on possible cause of pain. Possible recurrence of diverticula vs adhesion is DDx at this time. Please see notes on 12/07/16 and Urology note from 12/09/16.

## 2016-12-15 DIAGNOSIS — L570 Actinic keratosis: Secondary | ICD-10-CM | POA: Diagnosis not present

## 2016-12-15 DIAGNOSIS — L821 Other seborrheic keratosis: Secondary | ICD-10-CM | POA: Diagnosis not present

## 2016-12-15 DIAGNOSIS — D2261 Melanocytic nevi of right upper limb, including shoulder: Secondary | ICD-10-CM | POA: Diagnosis not present

## 2016-12-15 DIAGNOSIS — D225 Melanocytic nevi of trunk: Secondary | ICD-10-CM | POA: Diagnosis not present

## 2016-12-15 DIAGNOSIS — D2272 Melanocytic nevi of left lower limb, including hip: Secondary | ICD-10-CM | POA: Diagnosis not present

## 2016-12-15 DIAGNOSIS — X32XXXA Exposure to sunlight, initial encounter: Secondary | ICD-10-CM | POA: Diagnosis not present

## 2017-01-12 ENCOUNTER — Encounter: Payer: Self-pay | Admitting: Physician Assistant

## 2017-01-12 DIAGNOSIS — G479 Sleep disorder, unspecified: Secondary | ICD-10-CM

## 2017-01-14 MED ORDER — ALPRAZOLAM 0.5 MG PO TABS: 0.5000 mg | ORAL_TABLET | Freq: Every evening | ORAL | 0 refills | Status: DC | PRN

## 2017-01-14 NOTE — Telephone Encounter (Signed)
Please phone in alprazolam 0.5mg  Take one tab PO q hs prn sleep. #30 NR

## 2017-02-09 ENCOUNTER — Ambulatory Visit: Payer: Medicare Other | Admitting: Gastroenterology

## 2017-03-11 DIAGNOSIS — D485 Neoplasm of uncertain behavior of skin: Secondary | ICD-10-CM | POA: Diagnosis not present

## 2017-03-11 DIAGNOSIS — D040 Carcinoma in situ of skin of lip: Secondary | ICD-10-CM | POA: Diagnosis not present

## 2017-03-11 DIAGNOSIS — L570 Actinic keratosis: Secondary | ICD-10-CM | POA: Diagnosis not present

## 2017-03-11 DIAGNOSIS — L905 Scar conditions and fibrosis of skin: Secondary | ICD-10-CM | POA: Diagnosis not present

## 2017-03-11 DIAGNOSIS — C44319 Basal cell carcinoma of skin of other parts of face: Secondary | ICD-10-CM | POA: Diagnosis not present

## 2017-03-11 DIAGNOSIS — X32XXXA Exposure to sunlight, initial encounter: Secondary | ICD-10-CM | POA: Diagnosis not present

## 2017-04-21 DIAGNOSIS — L988 Other specified disorders of the skin and subcutaneous tissue: Secondary | ICD-10-CM | POA: Diagnosis not present

## 2017-04-21 DIAGNOSIS — L578 Other skin changes due to chronic exposure to nonionizing radiation: Secondary | ICD-10-CM | POA: Diagnosis not present

## 2017-04-21 DIAGNOSIS — D040 Carcinoma in situ of skin of lip: Secondary | ICD-10-CM | POA: Diagnosis not present

## 2017-04-21 DIAGNOSIS — C4401 Basal cell carcinoma of skin of lip: Secondary | ICD-10-CM | POA: Diagnosis not present

## 2017-04-21 DIAGNOSIS — L814 Other melanin hyperpigmentation: Secondary | ICD-10-CM | POA: Diagnosis not present

## 2017-05-24 ENCOUNTER — Ambulatory Visit (INDEPENDENT_AMBULATORY_CARE_PROVIDER_SITE_OTHER): Payer: Medicare Other

## 2017-05-24 ENCOUNTER — Ambulatory Visit (INDEPENDENT_AMBULATORY_CARE_PROVIDER_SITE_OTHER): Payer: Medicare Other | Admitting: Vascular Surgery

## 2017-05-24 ENCOUNTER — Encounter (INDEPENDENT_AMBULATORY_CARE_PROVIDER_SITE_OTHER): Payer: Self-pay | Admitting: Vascular Surgery

## 2017-05-24 VITALS — BP 154/74 | HR 60 | Resp 17 | Ht 63.0 in | Wt 181.0 lb

## 2017-05-24 DIAGNOSIS — R7303 Prediabetes: Secondary | ICD-10-CM | POA: Diagnosis not present

## 2017-05-24 DIAGNOSIS — E7800 Pure hypercholesterolemia, unspecified: Secondary | ICD-10-CM

## 2017-05-24 DIAGNOSIS — I6523 Occlusion and stenosis of bilateral carotid arteries: Secondary | ICD-10-CM

## 2017-05-24 DIAGNOSIS — I10 Essential (primary) hypertension: Secondary | ICD-10-CM | POA: Diagnosis not present

## 2017-05-25 ENCOUNTER — Encounter (INDEPENDENT_AMBULATORY_CARE_PROVIDER_SITE_OTHER): Payer: Self-pay | Admitting: Vascular Surgery

## 2017-05-25 NOTE — Progress Notes (Signed)
MRN : [id number]  Nicole Tucker is a 72 y.o. ([birth year]) female who presents with chief complaint of  Chief Complaint  Patient presents with  . Follow-up    6 month carotid follow up  .  History of Present Illness: The patient is seen for follow up evaluation of carotid stenosis. The carotid stenosis followed by ultrasound.   The patient denies amaurosis fugax. There is no recent history of TIA symptoms or focal motor deficits. There is no prior documented CVA.  The patient is taking enteric-coated aspirin 81 mg daily.  There is no history of migraine headaches. There is no history of seizures.  The patient has a history of coronary artery disease, no recent episodes of angina or shortness of breath. The patient denies PAD or claudication symptoms. There is a history of hyperlipidemia which is being treated with a statin.     No outpatient medications have been marked as taking for the 05/24/17 encounter (Office Visit) with Delana Meyer, Dolores Lory, MD.    Past Medical History:  Diagnosis Date  . Arthritis   . Diabetes mellitus without complication ([place])    New DX - no meds yet  . Diverticular disease   . Hypertension     Past Surgical History:  Procedure Laterality Date  . CATARACT EXTRACTION    . COLONOSCOPY N/A 10/07/2015   Procedure: COLONOSCOPY;  Surgeon: Lucilla Lame, MD;  Location: [facility];  Service: Endoscopy;  Laterality: N/A;  . DILATION AND CURETTAGE OF UTERUS    . LAPAROSCOPIC SIGMOID COLECTOMY N/A 11/15/2015   Procedure: LAPAROSCOPIC SIGMOID COLECTOMY possible open, possible colostomy;  Surgeon: Jules Husbands, MD;  Location: [facility];  Service: General;  Laterality: N/A;  Please have Hand port ( gelport green by [employer] [facility]) ready  . TUBAL LIGATION      Social History Social History   Tobacco Use  . Smoking status: Former Smoker    Types: Cigarettes    Last attempt to quit: 11/29/1990    Years since quitting: 26.5  . Smokeless tobacco: Never  Used  . Tobacco comment: quit 1992  Substance Use Topics  . Alcohol use: Yes    Alcohol/week: 3.0 oz    Types: 5 Glasses of wine per week  . Drug use: No    Family History Family History  Problem Relation Age of Onset  . Heart disease Mother   . Hypertension Mother   . Aneurysm Mother   . Alcohol abuse Father   . Bladder Cancer Sister   . Hypertension Sister   . Breast cancer Sister 24  . Hypertension Sister   . Diverticulitis Brother   . Hypertension Brother   . Hypertension Sister   . Diabetes Paternal Grandmother   . Heart disease Paternal Grandmother     Allergies  Allergen Reactions  . Codeine Itching and Anxiety     REVIEW OF SYSTEMS (Negative unless checked)  Constitutional: [] Weight loss  [] Fever  [] Chills Cardiac: [] Chest pain   [] Chest pressure   [] Palpitations   [] Shortness of breath when laying flat   [] Shortness of breath with exertion. Vascular:  [] Pain in legs with walking   [] Pain in legs at rest  [] History of DVT   [] Phlebitis   [] Swelling in legs   [] Varicose veins   [] Non-healing ulcers Pulmonary:   [] Uses home oxygen   [] Productive cough   [] Hemoptysis   [] Wheeze  [] COPD   [] Asthma Neurologic:  [] Dizziness   [] Seizures   [] History of stroke   []   History of TIA  [] Aphasia   [] Vissual changes   [] Weakness or numbness in arm   [] Weakness or numbness in leg Musculoskeletal:   [] Joint swelling   [] Joint pain   [] Low back pain Hematologic:  [] Easy bruising  [] Easy bleeding   [] Hypercoagulable state   [] Anemic Gastrointestinal:  [] Diarrhea   [] Vomiting  [] Gastroesophageal reflux/heartburn   [] Difficulty swallowing. Genitourinary:  [] Chronic kidney disease   [] Difficult urination  [] Frequent urination   [] Blood in urine Skin:  [] Rashes   [] Ulcers  Psychological:  [] History of anxiety   []  History of major depression.  Physical Examination  Vitals:   05/24/17 1124 05/24/17 1125  BP: (!) 175/82 (!) 154/74  Pulse: (!) 59 60  Resp: 17   Weight: 181 lb  (82.1 kg)   Height: 5\' 3"  (1.6 m)    Body mass index is 32.06 kg/m. Gen: WD/WN, NAD Head: [place]/AT, No temporalis wasting.  Ear/Nose/Throat: Hearing grossly intact, nares w/o erythema or drainage Eyes: PER, EOMI, sclera nonicteric.  Neck: Supple, no large masses.   Pulmonary:  Good air movement, no audible wheezing bilaterally, no use of accessory muscles.  Cardiac: RRR, no JVD Vascular:  Bilateral carotid bruits Vessel Right Left  Radial Palpable Palpable  Ulnar Palpable Palpable  Brachial Palpable Palpable  Carotid Palpable Palpable  Gastrointestinal: Non-distended. No guarding/no peritoneal signs.  Musculoskeletal: M/S 5/5 throughout.  No deformity or atrophy.  Neurologic: CN 2-12 intact. Symmetrical.  Speech is fluent. Motor exam as listed above. Psychiatric: Judgment intact, Mood & affect appropriate for pt's clinical situation. Dermatologic: No rashes or ulcers noted.  No changes consistent with cellulitis. Lymph : No lichenification or skin changes of chronic lymphedema.  CBC Lab Results  Component Value Date   WBC 8.4 10/27/2016   HGB 15.4 10/27/2016   HCT 45.3 10/27/2016   MCV 96 10/27/2016   PLT 224 10/27/2016    BMET    Component Value Date/Time   NA 140 10/27/2016 1031   NA 140 12/26/2011 1920   K 4.4 10/27/2016 1031   K 3.6 12/26/2011 1920   CL 99 10/27/2016 1031   CL 103 12/26/2011 1920   CO2 24 10/27/2016 1031   CO2 26 12/26/2011 1920   GLUCOSE 104 (H) 10/27/2016 1031   GLUCOSE 216 (H) 11/16/2015 0532   GLUCOSE 90 12/26/2011 1920   BUN 13 10/27/2016 1031   BUN 19 (H) 12/26/2011 1920   CREATININE 0.61 10/27/2016 1031   CREATININE 0.76 12/26/2011 1920   CALCIUM 9.8 10/27/2016 1031   CALCIUM 9.2 12/26/2011 1920   GFRNONAA 93 10/27/2016 1031   GFRNONAA >60 12/26/2011 1920   GFRAA 108 10/27/2016 1031   GFRAA >60 12/26/2011 1920   CrCl cannot be calculated (Patient's most recent lab result is older than the maximum 21 days allowed.).  COAG Lab  Results  Component Value Date   INR 1.11 08/31/2015   INR 1.00 08/30/2015    Radiology No results found.  Assessment/Plan 1. Bilateral carotid artery stenosis Recommend:  Given the patient's asymptomatic subcritical stenosis no further invasive testing or surgery at this time.  Continue antiplatelet therapy as prescribed Continue management of CAD, HTN and Hyperlipidemia Healthy heart diet,  encouraged exercise at least 4 times per week Follow up in 6 months with duplex ultrasound and physical exam   - VAS US CAROTID; Future  2. Benign essential HTN Continue antihypertensive medications as already ordered, these medications have been reviewed and there are no changes at this time.   3. Hypercholesterolemia  without hypertriglyceridemia Continue statin as ordered and reviewed, no changes at this time   4. Borderline diabetes Continue hypoglycemic medications as already ordered, these medications have been reviewed and there are no changes at this time.  Hgb A1C to be monitored as already arranged by primary service     Hortencia Pilar, MD  05/25/2017 2:58 PM

## 2017-06-22 DIAGNOSIS — Z961 Presence of intraocular lens: Secondary | ICD-10-CM | POA: Diagnosis not present

## 2017-08-14 IMAGING — CT CT ABD-PELV W/ CM
2 of 5 series · 16 of 46 positions shown, 18 images · IV contrast (iopamidol)
Comparison: December 26, 2011

CLINICAL DATA: Pain in the right and left lower quadrants. Black
stools.

EXAM:
CT ABDOMEN AND PELVIS WITH CONTRAST
TECHNIQUE: Multidetector CT imaging of the abdomen and pelvis was performed
using the standard protocol following bolus administration of
intravenous contrast.
CONTRAST:  100mL 156GGE-TEE IOPAMIDOL (156GGE-TEE) INJECTION 61%

[Series 2: routine abd pel with · axial · 0.71mm/px · z∈[-283,+107]mm · 13 of 88 slices shown, 15 images]
[im 5/88  soft-tissue]
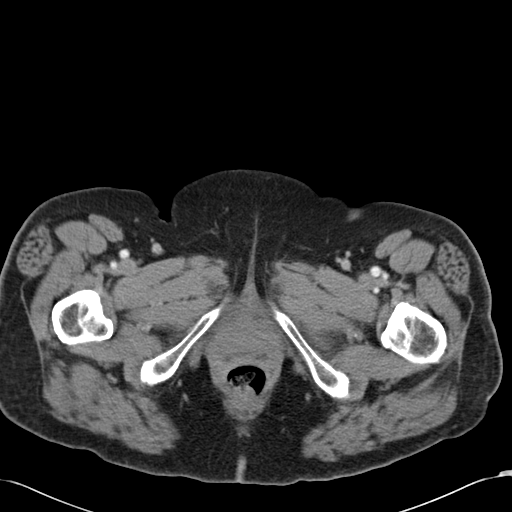
[im 5/88  bone]
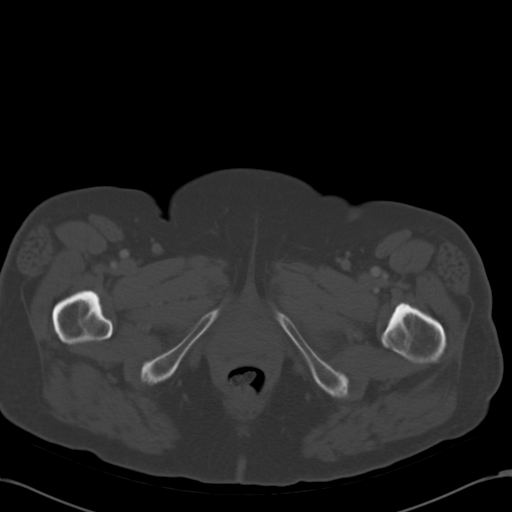
[im 14/88  soft-tissue]
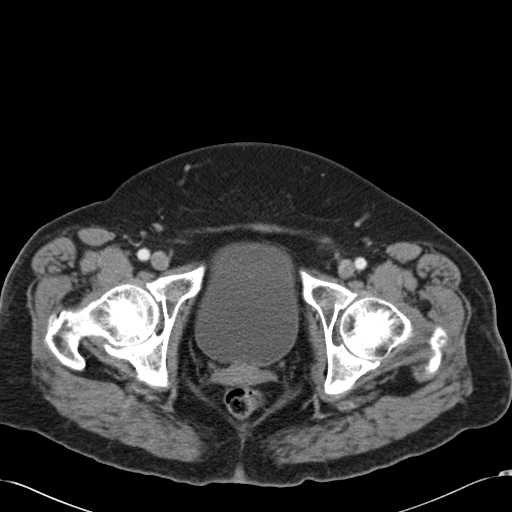
[im 19/88  soft-tissue]
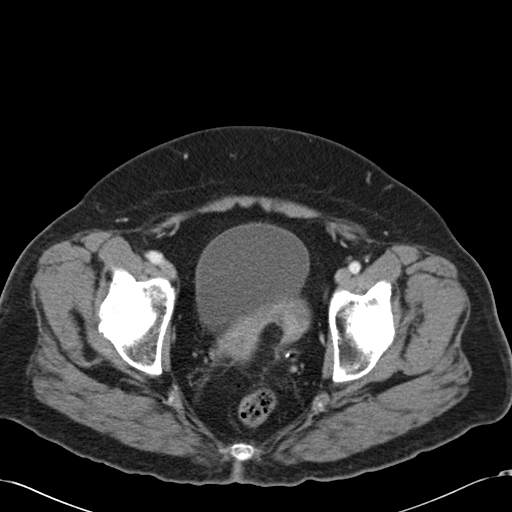
[im 23/88  soft-tissue]
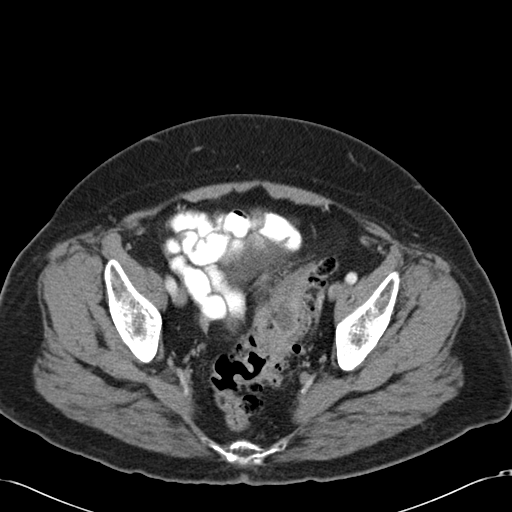
[im 33/88  soft-tissue]
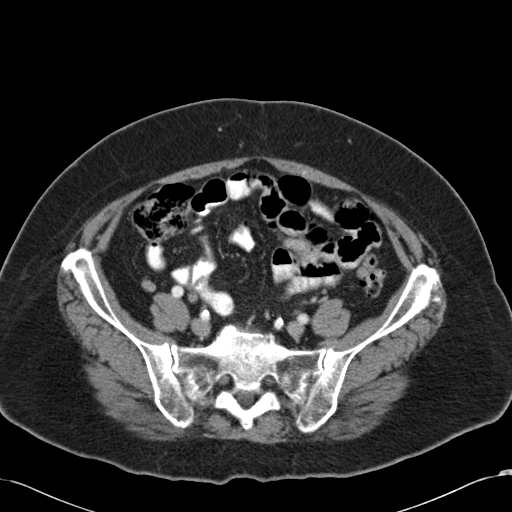
[im 37/88  soft-tissue]
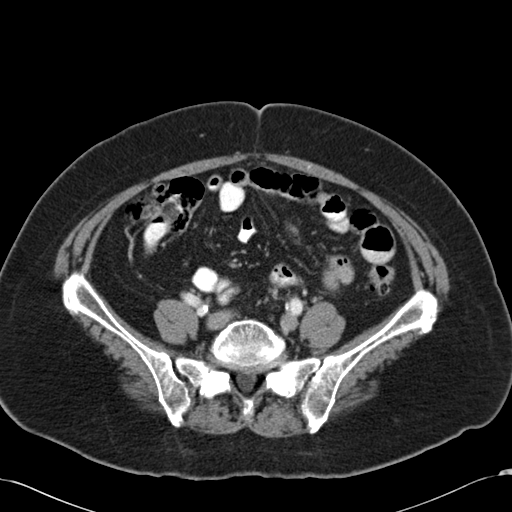
[im 46/88  soft-tissue]
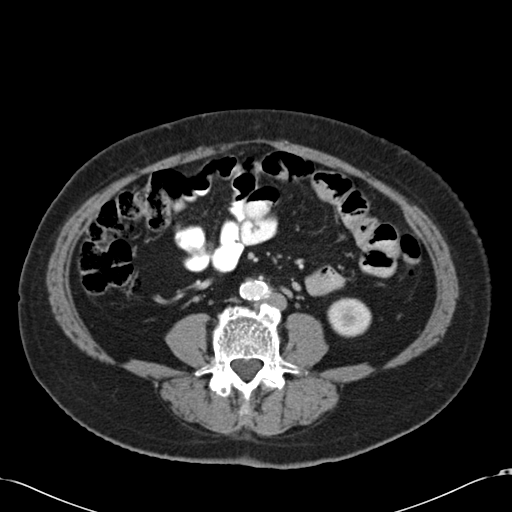
[im 51/88  soft-tissue]
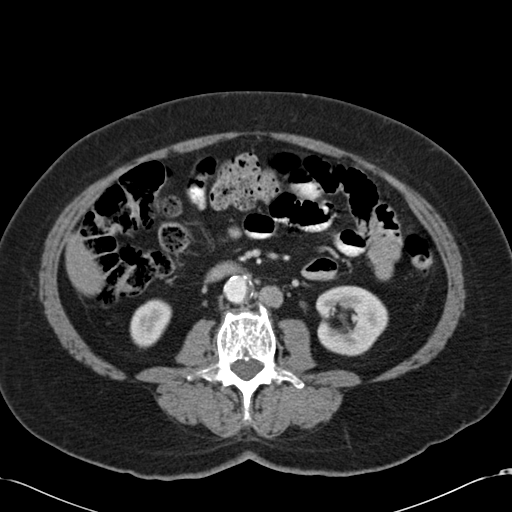
[im 55/88  soft-tissue]
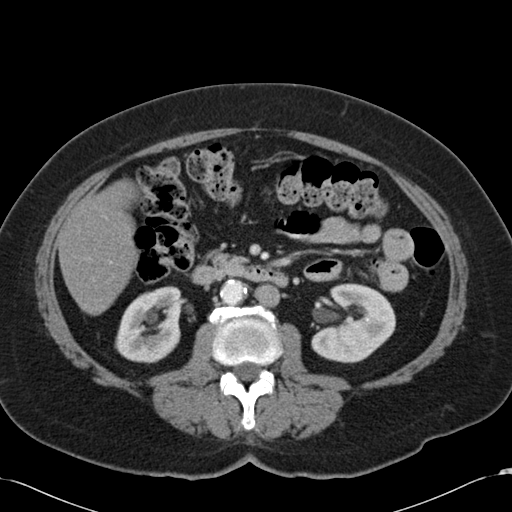
[im 55/88  bone]
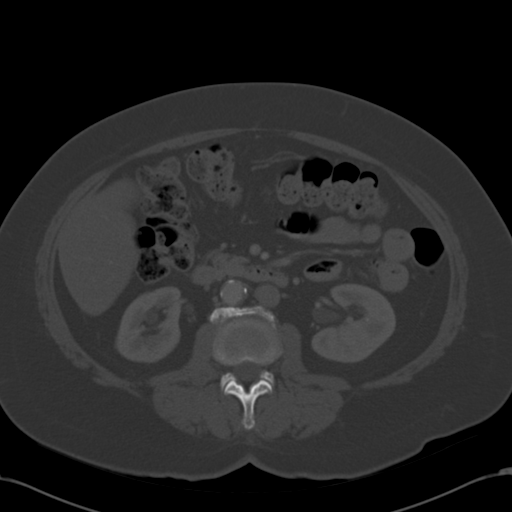
[im 65/88  soft-tissue]
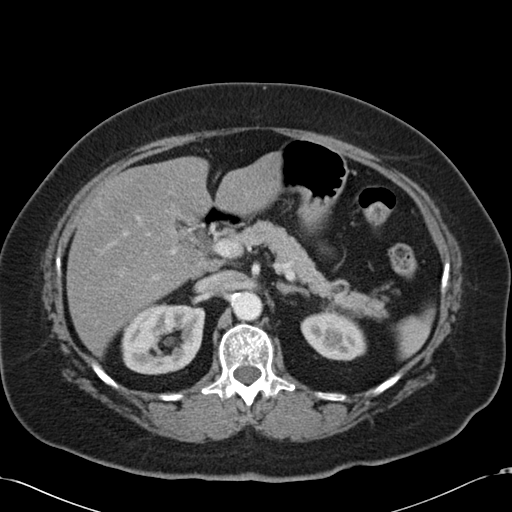
[im 69/88  soft-tissue]
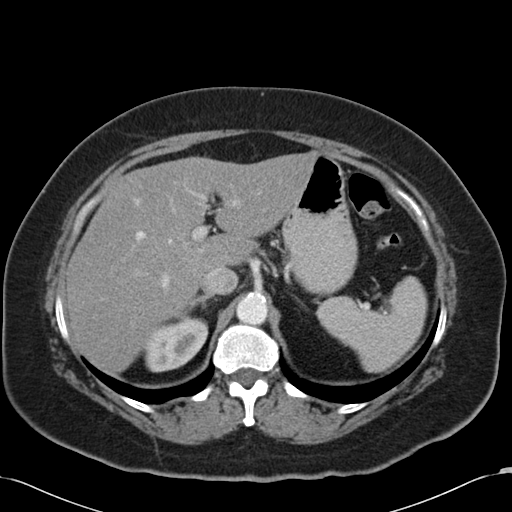
[im 74/88  soft-tissue]
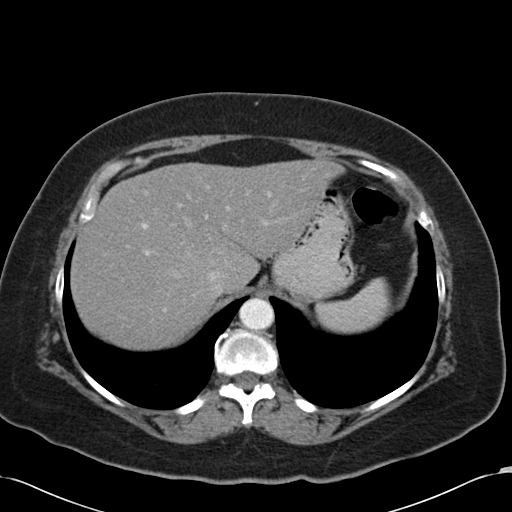
[im 83/88  soft-tissue]
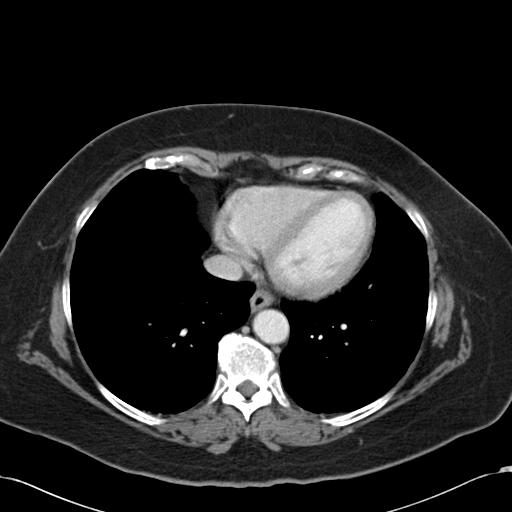

[Series 5: cor routine abd pel with · coronal · 0.65mm/px · 3 of 124 slices shown]
[im 42/124  soft-tissue]
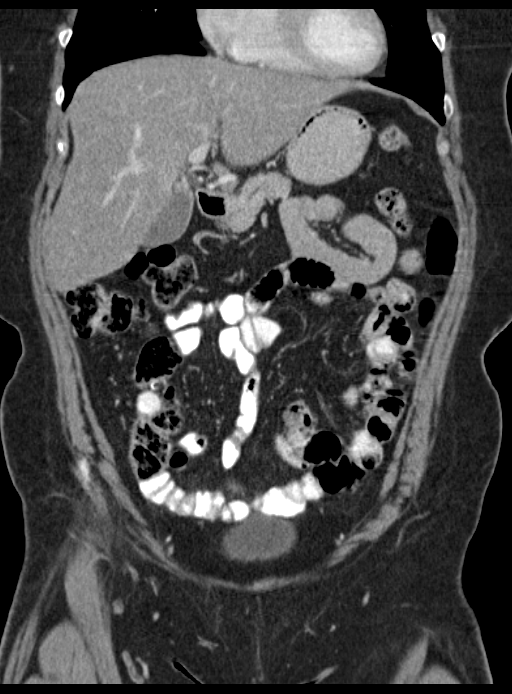
[im 55/124  soft-tissue]
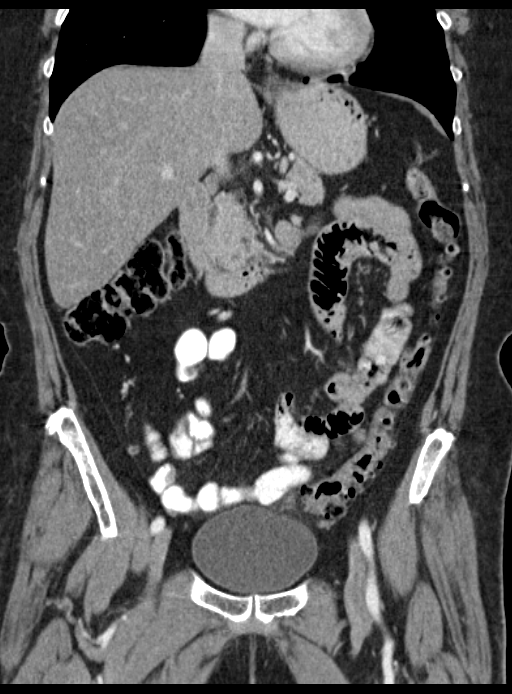
[im 69/124  soft-tissue]
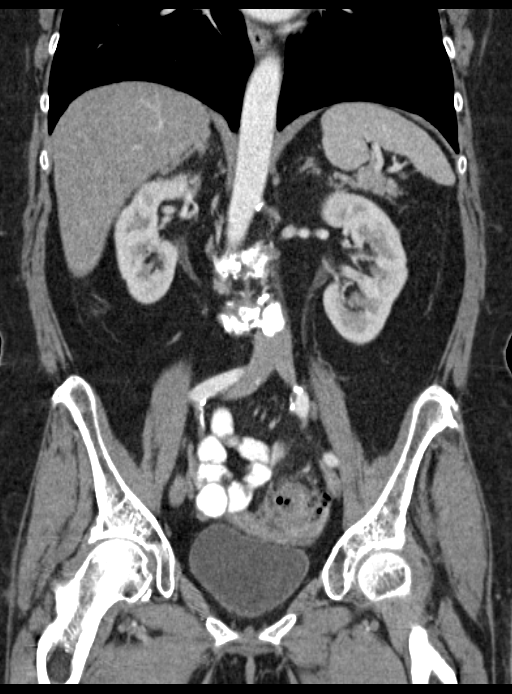

[16 of 46 positions shown; findings below may reference images not displayed]

FINDINGS: Normal lung bases.

No free air. Colonic diverticuli are seen, most prominent in the
sigmoid colon. There is pericolonic stranding in the region of the
sigmoid colon. There is also associated wall thickening. There is a
fluid collection centered in the wall of the sigmoid colon measuring
2.4 by 1.0 by 2.6 cm on axial image 64 and coronal image 73
consistent with an intramural abscess. There is an no definitive
extraluminal gas. The remainder of the colon is normal. The appendix
is normal as well with no appendicitis.

Hepatic steatosis is identified. The liver, gallbladder, spleen,
adrenal glands, and pancreas are within normal limits. A renal cyst
is seen off the lateral mid right kidney. A 6 mm stone is seen in
the mid left kidney. No suspicious masses or hydronephrosis. No
ureterectasis or ureteral stones. Atherosclerotic changes seen in a
non aneurysmal aorta. No adenopathy. There is a small hiatal hernia.
The stomach and small bowel are otherwise normal.

No adenopathy or mass in the pelvis. The bladder is normal. The
uterus and adnexae are unremarkable.

The visualize the bones demonstrate degenerative changes with no
other acute abnormalities.

Delayed images through the upper abdomen demonstrate no filling
defects in the opacified portions of the renal collecting systems.
IMPRESSION: 1. Sigmoid diverticulitis with an associated intramural abscess in
the wall of the sigmoid colon as described above.

## 2017-08-23 ENCOUNTER — Other Ambulatory Visit: Payer: Self-pay | Admitting: Physician Assistant

## 2017-08-23 DIAGNOSIS — Z1231 Encounter for screening mammogram for malignant neoplasm of breast: Secondary | ICD-10-CM

## 2017-11-02 ENCOUNTER — Ambulatory Visit (INDEPENDENT_AMBULATORY_CARE_PROVIDER_SITE_OTHER): Payer: Medicare Other

## 2017-11-02 ENCOUNTER — Ambulatory Visit
Admission: RE | Admit: 2017-11-02 | Discharge: 2017-11-02 | Disposition: A | Discharge status: Home / Self Care | Payer: Medicare Other | Source: Ambulatory Visit | Attending: Physician Assistant | Admitting: Physician Assistant

## 2017-11-02 ENCOUNTER — Ambulatory Visit (INDEPENDENT_AMBULATORY_CARE_PROVIDER_SITE_OTHER): Payer: Medicare Other | Admitting: Physician Assistant

## 2017-11-02 ENCOUNTER — Encounter: Payer: Self-pay | Admitting: Physician Assistant

## 2017-11-02 VITALS — BP 150/76 | HR 54 | Temp 97.8°F | Ht 63.0 in | Wt 158.0 lb

## 2017-11-02 DIAGNOSIS — R7303 Prediabetes: Secondary | ICD-10-CM | POA: Diagnosis not present

## 2017-11-02 DIAGNOSIS — I10 Essential (primary) hypertension: Secondary | ICD-10-CM

## 2017-11-02 DIAGNOSIS — Z1231 Encounter for screening mammogram for malignant neoplasm of breast: Secondary | ICD-10-CM | POA: Insufficient documentation

## 2017-11-02 DIAGNOSIS — E7800 Pure hypercholesterolemia, unspecified: Secondary | ICD-10-CM

## 2017-11-02 DIAGNOSIS — Z0000 Encounter for general adult medical examination without abnormal findings: Secondary | ICD-10-CM

## 2017-11-02 NOTE — Patient Instructions (Signed)
Health Maintenance for Postmenopausal Women Menopause is a normal process in which your reproductive ability comes to an end. This process happens gradually over a span of months to years, usually between the ages of 22 and 9. Menopause is complete when you have missed 12 consecutive menstrual periods. It is important to talk with your health care provider about some of the most common conditions that affect postmenopausal women, such as heart disease, cancer, and bone loss (osteoporosis). Adopting a healthy lifestyle and getting preventive care can help to promote your health and wellness. Those actions can also lower your chances of developing some of these common conditions. What should I know about menopause? During menopause, you may experience a number of symptoms, such as:  Moderate-to-severe hot flashes.  Night sweats.  Decrease in sex drive.  Mood swings.  Headaches.  Tiredness.  Irritability.  Memory problems.  Insomnia.  Choosing to treat or not to treat menopausal changes is an individual decision that you make with your health care provider. What should I know about hormone replacement therapy and supplements? Hormone therapy products are effective for treating symptoms that are associated with menopause, such as hot flashes and night sweats. Hormone replacement carries certain risks, especially as you become older. If you are thinking about using estrogen or estrogen with progestin treatments, discuss the benefits and risks with your health care provider. What should I know about heart disease and stroke? Heart disease, heart attack, and stroke become more likely as you age. This may be due, in part, to the hormonal changes that your body experiences during menopause. These can affect how your body processes dietary fats, triglycerides, and cholesterol. Heart attack and stroke are both medical emergencies. There are many things that you can do to help prevent heart disease  and stroke:  Have your blood pressure checked at least every 1-2 years. High blood pressure causes heart disease and increases the risk of stroke.  If you are 53-22 years old, ask your health care provider if you should take aspirin to prevent a heart attack or a stroke.  Do not use any tobacco products, including cigarettes, chewing tobacco, or electronic cigarettes. If you need help quitting, ask your health care provider.  It is important to eat a healthy diet and maintain a healthy weight. ? Be sure to include plenty of vegetables, fruits, low-fat dairy products, and lean protein. ? Avoid eating foods that are high in solid fats, added sugars, or salt (sodium).  Get regular exercise. This is one of the most important things that you can do for your health. ? Try to exercise for at least 150 minutes each week. The type of exercise that you do should increase your heart rate and make you sweat. This is known as moderate-intensity exercise. ? Try to do strengthening exercises at least twice each week. Do these in addition to the moderate-intensity exercise.  Know your numbers.Ask your health care provider to check your cholesterol and your blood glucose. Continue to have your blood tested as directed by your health care provider.  What should I know about cancer screening? There are several types of cancer. Take the following steps to reduce your risk and to catch any cancer development as early as possible. Breast Cancer  Practice breast self-awareness. ? This means understanding how your breasts normally appear and feel. ? It also means doing regular breast self-exams. Let your health care provider know about any changes, no matter how small.  If you are 40  or older, have a clinician do a breast exam (clinical breast exam or CBE) every year. Depending on your age, family history, and medical history, it may be recommended that you also have a yearly breast X-ray (mammogram).  If you  have a family history of breast cancer, talk with your health care provider about genetic screening.  If you are at high risk for breast cancer, talk with your health care provider about having an MRI and a mammogram every year.  Breast cancer (BRCA) gene test is recommended for women who have family members with BRCA-related cancers. Results of the assessment will determine the need for genetic counseling and BRCA1 and for BRCA2 testing. BRCA-related cancers include these types: ? Breast. This occurs in males or females. ? Ovarian. ? Tubal. This may also be called fallopian tube cancer. ? Cancer of the abdominal or pelvic lining (peritoneal cancer). ? Prostate. ? Pancreatic.  Cervical, Uterine, and Ovarian Cancer Your health care provider may recommend that you be screened regularly for cancer of the pelvic organs. These include your ovaries, uterus, and vagina. This screening involves a pelvic exam, which includes checking for microscopic changes to the surface of your cervix (Pap test).  For women ages 21-65, health care providers may recommend a pelvic exam and a Pap test every three years. For women ages 79-65, they may recommend the Pap test and pelvic exam, combined with testing for human papilloma virus (HPV), every five years. Some types of HPV increase your risk of cervical cancer. Testing for HPV may also be done on women of any age who have unclear Pap test results.  Other health care providers may not recommend any screening for nonpregnant women who are considered low risk for pelvic cancer and have no symptoms. Ask your health care provider if a screening pelvic exam is right for you.  If you have had past treatment for cervical cancer or a condition that could lead to cancer, you need Pap tests and screening for cancer for at least 20 years after your treatment. If Pap tests have been discontinued for you, your risk factors (such as having a new sexual partner) need to be  reassessed to determine if you should start having screenings again. Some women have medical problems that increase the chance of getting cervical cancer. In these cases, your health care provider may recommend that you have screening and Pap tests more often.  If you have a family history of uterine cancer or ovarian cancer, talk with your health care provider about genetic screening.  If you have vaginal bleeding after reaching menopause, tell your health care provider.  There are currently no reliable tests available to screen for ovarian cancer.  Lung Cancer Lung cancer screening is recommended for adults 69-62 years old who are at high risk for lung cancer because of a history of smoking. A yearly low-dose CT scan of the lungs is recommended if you:  Currently smoke.  Have a history of at least 30 pack-years of smoking and you currently smoke or have quit within the past 15 years. A pack-year is smoking an average of one pack of cigarettes per day for one year.  Yearly screening should:  Continue until it has been 15 years since you quit.  Stop if you develop a health problem that would prevent you from having lung cancer treatment.  Colorectal Cancer  This type of cancer can be detected and can often be prevented.  Routine colorectal cancer screening usually begins at  age 42 and continues through age 45.  If you have risk factors for colon cancer, your health care provider may recommend that you be screened at an earlier age.  If you have a family history of colorectal cancer, talk with your health care provider about genetic screening.  Your health care provider may also recommend using home test kits to check for hidden blood in your stool.  A small camera at the end of a tube can be used to examine your colon directly (sigmoidoscopy or colonoscopy). This is done to check for the earliest forms of colorectal cancer.  Direct examination of the colon should be repeated every  5-10 years until age 71. However, if early forms of precancerous polyps or small growths are found or if you have a family history or genetic risk for colorectal cancer, you may need to be screened more often.  Skin Cancer  Check your skin from head to toe regularly.  Monitor any moles. Be sure to tell your health care provider: ? About any new moles or changes in moles, especially if there is a change in a mole's shape or color. ? If you have a mole that is larger than the size of a pencil eraser.  If any of your family members has a history of skin cancer, especially at a young age, talk with your health care provider about genetic screening.  Always use sunscreen. Apply sunscreen liberally and repeatedly throughout the day.  Whenever you are outside, protect yourself by wearing long sleeves, pants, a wide-brimmed hat, and sunglasses.  What should I know about osteoporosis? Osteoporosis is a condition in which bone destruction happens more quickly than new bone creation. After menopause, you may be at an increased risk for osteoporosis. To help prevent osteoporosis or the bone fractures that can happen because of osteoporosis, the following is recommended:  If you are 46-71 years old, get at least 1,000 mg of calcium and at least 600 mg of vitamin D per day.  If you are older than age 55 but younger than age 65, get at least 1,200 mg of calcium and at least 600 mg of vitamin D per day.  If you are older than age 54, get at least 1,200 mg of calcium and at least 800 mg of vitamin D per day.  Smoking and excessive alcohol intake increase the risk of osteoporosis. Eat foods that are rich in calcium and vitamin D, and do weight-bearing exercises several times each week as directed by your health care provider. What should I know about how menopause affects my mental health? Depression may occur at any age, but it is more common as you become older. Common symptoms of depression  include:  Low or sad mood.  Changes in sleep patterns.  Changes in appetite or eating patterns.  Feeling an overall lack of motivation or enjoyment of activities that you previously enjoyed.  Frequent crying spells.  Talk with your health care provider if you think that you are experiencing depression. What should I know about immunizations? It is important that you get and maintain your immunizations. These include:  Tetanus, diphtheria, and pertussis (Tdap) booster vaccine.  Influenza every year before the flu season begins.  Pneumonia vaccine.  Shingles vaccine.  Your health care provider may also recommend other immunizations. This information is not intended to replace advice given to you by your health care provider. Make sure you discuss any questions you have with your health care provider. Document Released: 07/03/2005  Document Revised: 11/29/2015 Document Reviewed: 02/12/2015 [employer] Interactive Patient Education  2018 [employer]  

## 2017-11-02 NOTE — Progress Notes (Signed)
Patient: Nicole Tucker, Female    DOB: 1945/11/21, 72 y.o.   MRN: [id number] Visit Date: 11/02/2017  Today's Provider: Mar Daring, PA-C   Chief Complaint  Patient presents with  . Annual Exam   Subjective:     Complete Physical Nicole Tucker is a 72 y.o. female. She feels well. She reports exercising none. She reports she is sleeping well. BP readings at home 130s/70s average.  -----------------------------------------------------------  Review of Systems  Constitutional: Negative.   HENT: Negative.   Eyes: Negative.   Respiratory: Negative.   Cardiovascular: Negative.   Gastrointestinal: Negative.   Endocrine: Negative.   Genitourinary: Negative.   Musculoskeletal: Negative.   Skin: Negative.   Allergic/Immunologic: Negative.   Neurological: Positive for dizziness.  Hematological: Negative.   Psychiatric/Behavioral: Negative.     Social History   Socioeconomic History  . Marital status: Divorced    Spouse name: Not on file  . Number of children: 1  . Years of education: Not on file  . Highest education level: Bachelor's degree (e.g., BA, AB, BS)  Occupational History  . Occupation: retired  Scientific laboratory technician  . Financial resource strain: Not hard at all  . Food insecurity:    Worry: Never true    Inability: Never true  . Transportation needs:    Medical: No    Non-medical: No  Tobacco Use  . Smoking status: Former Smoker    Types: Cigarettes    Last attempt to quit: 11/29/1990    Years since quitting: 26.9  . Smokeless tobacco: Never Used  . Tobacco comment: quit 1992  Substance and Sexual Activity  . Alcohol use: Yes    Alcohol/week: 0.6 - 1.2 oz    Types: 1 - 2 Glasses of wine per week  . Drug use: No  . Sexual activity: Not on file  Lifestyle  . Physical activity:    Days per week: Not on file    Minutes per session: Not on file  . Stress: Not at all  Relationships  . Social connections:    Talks on phone: Not on file    Gets  together: Not on file    Attends religious service: Not on file    Active member of club or organization: Not on file    Attends meetings of clubs or organizations: Not on file    Relationship status: Not on file  . Intimate partner violence:    Fear of current or ex partner: Not on file    Emotionally abused: Not on file    Physically abused: Not on file    Forced sexual activity: Not on file  Other Topics Concern  . Not on file  Social History Narrative  . Not on file    Past Medical History:  Diagnosis Date  . Arthritis   . Diabetes mellitus without complication ([place])    New DX - no meds yet  . Diverticular disease   . Hypertension      Patient Active Problem List   Diagnosis Date Noted  . Bilateral carotid artery stenosis 11/19/2016  . Diverticulitis large intestine 11/15/2015  . Other intestinal obstruction   . Diverticulitis of large intestine without perforation or abscess without bleeding   . Anxiety 07/03/2015  . Diverticulitis 07/03/2015  . Benign essential HTN 07/03/2015  . Colon polyp 07/03/2015  . Borderline diabetes 07/03/2015  . Hypercholesterolemia without hypertriglyceridemia 07/03/2015  . Calculus of kidney 10/23/2013  . Microscopic hematuria 10/23/2013  .  Kidney lump 10/23/2013    Past Surgical History:  Procedure Laterality Date  . CATARACT EXTRACTION    . COLONOSCOPY N/A 10/07/2015   Procedure: COLONOSCOPY;  Surgeon: Lucilla Lame, MD;  Location: [place];  Service: Endoscopy;  Laterality: N/A;  . DILATION AND CURETTAGE OF UTERUS    . LAPAROSCOPIC SIGMOID COLECTOMY N/A 11/15/2015   Procedure: LAPAROSCOPIC SIGMOID COLECTOMY possible open, possible colostomy;  Surgeon: Jules Husbands, MD;  Location: [facility];  Service: General;  Laterality: N/A;  Please have Hand port ( gelport green by [employer]) ready  . TUBAL LIGATION      Her family history includes Alcohol abuse in her father; Aneurysm in her mother; Bladder Cancer in her  sister; Breast cancer (age of onset: 38) in her sister; Diabetes in her paternal grandmother; Diverticulitis in her brother; Heart disease in her mother and paternal grandmother; Hypertension in her brother, mother, sister, sister, and sister.      Current Outpatient Medications:  .  ALPRAZolam (XANAX) 0.5 MG tablet, Take 1 tablet (0.5 mg total) by mouth at bedtime as needed for anxiety. (Patient not taking: Reported on 11/02/2017), Disp: 30 tablet, Rfl: 0 .  Biotin 10000 MCG TABS, Take by mouth daily. , Disp: , Rfl:  .  Cholecalciferol (VITAMIN D) 2000 units CAPS, Take by mouth daily. , Disp: , Rfl:  .  Cinnamon Bark POWD, by Does not apply route daily. , Disp: , Rfl:  .  Coenzyme Q10 (CO Q10) 200 MG CAPS, Take by mouth daily. , Disp: , Rfl:  .  Ginkgo Biloba 40 MG TABS, Take 120 mg by mouth daily. , Disp: , Rfl:  .  Gymnema Sylvestris Leaf POWD, by Does not apply route., Disp: , Rfl:  .  hydrochlorothiazide (HYDRODIURIL) 25 MG tablet, Take 1 tablet (25 mg total) by mouth daily., Disp: 90 tablet, Rfl: 3 .  lisinopril (PRINIVIL,ZESTRIL) 20 MG tablet, Take 1 tablet (20 mg total) by mouth daily., Disp: 90 tablet, Rfl: 3 .  milk thistle 175 MG tablet, Take 175 mg by mouth daily. , Disp: , Rfl:  .  Multiple Vitamin (MULTIVITAMIN WITH MINERALS) TABS tablet, Take 1 tablet by mouth daily., Disp: , Rfl:  .  omega-3 acid ethyl esters (LOVAZA) 1 g capsule, Take 1 g by mouth daily., Disp: , Rfl:  .  TURMERIC PO, Take by mouth 2 (two) times daily. , Disp: , Rfl:  .  VITAMIN E PO, Take 400 Units by mouth daily. , Disp: , Rfl:   Patient Care Team: Mar Daring, PA-C as PCP - General (Family Medicine) Delana Meyer, Dolores Lory, MD as Consulting Physician (Vascular Surgery) Oneta Rack, MD as Consulting Physician (Dermatology) Pa, [place] as Consulting Physician     Objective:  Vitals: BP (!) 150/76 (BP Location: Right Arm)   Pulse (!) 54   Temp 97.8 F (36.6 C) (Oral)   Ht 5\' 3"   (1.6 m)   Wt 158 lb (71.7 kg)   BMI 27.99 kg/m   Body mass index is 27.99 kg/m.   Physical Exam  Constitutional: She is oriented to person, place, and time. She appears well-developed and well-nourished.  HENT:  Head: Normocephalic.  Right Ear: Hearing, tympanic membrane, external ear and ear canal normal.  Left Ear: Hearing, tympanic membrane, external ear and ear canal normal.  Nose: Nose normal.  Mouth/Throat: Oropharynx is clear and moist and mucous membranes are normal.  Eyes: Pupils are equal, round, and reactive to light.  Conjunctivae and EOM are normal.  Neck: Normal range of motion. Neck supple. Carotid bruit is not present.  Cardiovascular: Normal rate, regular rhythm, normal heart sounds and intact distal pulses.  Pulmonary/Chest: Effort normal and breath sounds normal.  Abdominal: Soft. Bowel sounds are normal.  Musculoskeletal: Normal range of motion.  Neurological: She is alert and oriented to person, place, and time.  Skin: Skin is warm.  Psychiatric: She has a normal mood and affect. Her behavior is normal. Judgment and thought content normal.  Vitals reviewed.   Activities of Daily Living In your present state of health, do you have any difficulty performing the following activities: 11/02/2017  Hearing? N  Vision? N  Difficulty concentrating or making decisions? Y  Walking or climbing stairs? N  Dressing or bathing? N  Doing errands, shopping? N  Preparing Food and eating ? N  Using the Toilet? N  In the past six months, have you accidently leaked urine? N  Do you have problems with loss of bowel control? N  Managing your Medications? N  Managing your Finances? N  Housekeeping or managing your Housekeeping? N  Some recent data might be hidden    Fall Risk Assessment Fall Risk  11/02/2017 10/27/2016 09/10/2015 05/17/2015  Falls in the past year? No No No Yes  Number falls in past yr: - - - 1  Injury with Fall? - - - No     Depression Screen PHQ 2/9  Scores 11/02/2017 10/27/2016 10/27/2016 09/10/2015  PHQ - 2 Score 0 0 0 1  PHQ- 9 Score - 4 - -    Cognitive Testing - 6-CIT:Pt declined screening today with nurse health advisor.   Assessment & Plan:    Annual Physical Reviewed patient's Family Medical History Reviewed and updated list of patient's medical providers Assessment of cognitive impairment was done Assessed patient's functional ability Established a written schedule for health screening [place] Completed and Reviewed  Exercise Activities and Dietary recommendations Goals    . Exercise 3x per week (30 min per time)     Recommend increasing exercise to walking 3 days a week for 30 minutes.       Immunization History  Administered Date(s) Administered  . Pneumococcal Conjugate-13 09/10/2015  . Pneumococcal Polysaccharide-23 10/27/2016  . Tdap 10/14/2009    Health Maintenance  Topic Date Due  . FOOT EXAM  03/10/1958  . OPHTHALMOLOGY EXAM  05/13/2016  . HEMOGLOBIN A1C  04/28/2017  . INFLUENZA VACCINE  12/23/2017  . MAMMOGRAM  10/28/2018  . TETANUS/TDAP  10/15/2019  . COLONOSCOPY  10/06/2025  . DEXA SCAN  Completed  . Hepatitis C Screening  Completed  . PNA vac Low Risk Adult  Completed     Discussed health benefits of physical activity, and encouraged her to engage in regular exercise appropriate for her age and condition.    1. Annual physical exam Normal physical exam today. Will check labs as below and f/u pending lab results. If labs are stable and WNL she will not need to have these rechecked for one year at her next annual physical exam. She is to call the office in the meantime if she has any acute issue, questions or concerns. - CBC with Differential/Platelet - Comprehensive metabolic panel - TSH  2. Benign essential HTN Stable. Continue HCTZ 25mg  and lisinopril 20mg . Will check labs as below and f/u pending results. - CBC with Differential/Platelet - Comprehensive metabolic  panel  3. Borderline diabetes Diet controlled. Will check labs as  below and f/u pending results. - Hemoglobin A1c  4. Hypercholesterolemia without hypertriglyceridemia Declines statins. Diet controlled. Will check labs as below and f/u pending results. - Lipid panel  ------------------------------------------------------------------------------------------------------------    Mar Daring, PA-C  [facility]

## 2017-11-02 NOTE — Patient Instructions (Addendum)
Nicole Tucker , Thank you for taking time to come for your Medicare Wellness Visit. I appreciate your ongoing commitment to your health goals. Please review the following plan we discussed and let me know if I can assist you in the future.   Screening recommendations/referrals: Colonoscopy: Up to date Mammogram: Up to date Bone Density: Up to date Recommended yearly ophthalmology/optometry visit for glaucoma screening and checkup Recommended yearly dental visit for hygiene and checkup  Vaccinations: Influenza vaccine: N/A Pneumococcal vaccine: Up to date Tdap vaccine: Up to date Shingles vaccine: Pt declines today.     Advanced directives: Advance directive discussed with you today. I have provided a copy for you to complete at home and have notarized. Once this is complete please bring a copy in to our office so we can scan it into your chart.  Conditions/risks identified: Pt to start back exercising 3 days a week for at least 30 minutes.   Next appointment: 10:00 AM today with Fenton Malling.   Preventive Care 55 Years and Older, Female Preventive care refers to lifestyle choices and visits with your health care provider that can promote health and wellness. What does preventive care include?  A yearly physical exam. This is also called an annual well check.  Dental exams once or twice a year.  Routine eye exams. Ask your health care provider how often you should have your eyes checked.  Personal lifestyle choices, including:  Daily care of your teeth and gums.  Regular physical activity.  Eating a healthy diet.  Avoiding tobacco and drug use.  Limiting alcohol use.  Practicing safe sex.  Taking low-dose aspirin every day.  Taking vitamin and mineral supplements as recommended by your health care provider. What happens during an annual well check? The services and screenings done by your health care provider during your annual well check will depend on your age,  overall health, lifestyle risk factors, and family history of disease. Counseling  Your health care provider may ask you questions about your:  Alcohol use.  Tobacco use.  Drug use.  Emotional well-being.  Home and relationship well-being.  Sexual activity.  Eating habits.  History of falls.  Memory and ability to understand (cognition).  Work and work Statistician.  Reproductive health. Screening  You may have the following tests or measurements:  Height, weight, and BMI.  Blood pressure.  Lipid and cholesterol levels. These may be checked every 5 years, or more frequently if you are over 106 years old.  Skin check.  Lung cancer screening. You may have this screening every year starting at age 69 if you have a 30-pack-year history of smoking and currently smoke or have quit within the past 15 years.  Fecal occult blood test (FOBT) of the stool. You may have this test every year starting at age 58.  Flexible sigmoidoscopy or colonoscopy. You may have a sigmoidoscopy every 5 years or a colonoscopy every 10 years starting at age 33.  Hepatitis C blood test.  Hepatitis B blood test.  Sexually transmitted disease (STD) testing.  Diabetes screening. This is done by checking your blood sugar (glucose) after you have not eaten for a while (fasting). You may have this done every 1-3 years.  Bone density scan. This is done to screen for osteoporosis. You may have this done starting at age 33.  Mammogram. This may be done every 1-2 years. Talk to your health care provider about how often you should have regular mammograms. Talk with your health  care provider about your test results, treatment options, and if necessary, the need for more tests. Vaccines  Your health care provider may recommend certain vaccines, such as:  Influenza vaccine. This is recommended every year.  Tetanus, diphtheria, and acellular pertussis (Tdap, Td) vaccine. You may need a Td booster every 10  years.  Zoster vaccine. You may need this after age 29.  Pneumococcal 13-valent conjugate (PCV13) vaccine. One dose is recommended after age 66.  Pneumococcal polysaccharide (PPSV23) vaccine. One dose is recommended after age 65. Talk to your health care provider about which screenings and vaccines you need and how often you need them. This information is not intended to replace advice given to you by your health care provider. Make sure you discuss any questions you have with your health care provider. Document Released: 06/07/2015 Document Revised: 01/29/2016 Document Reviewed: 03/12/2015 Elsevier Interactive Patient Education  2017 [employer] Prevention in the Home Falls can cause injuries. They can happen to people of all ages. There are many things you can do to make your home safe and to help prevent falls. What can I do on the outside of my home?  Regularly fix the edges of walkways and driveways and fix any cracks.  Remove anything that might make you trip as you walk through a door, such as a raised step or threshold.  Trim any bushes or trees on the path to your home.  Use bright outdoor lighting.  Clear any walking paths of anything that might make someone trip, such as rocks or tools.  Regularly check to see if handrails are loose or broken. Make sure that both sides of any steps have handrails.  Any raised decks and porches should have guardrails on the edges.  Have any leaves, snow, or ice cleared regularly.  Use sand or salt on walking paths during winter.  Clean up any spills in your garage right away. This includes oil or grease spills. What can I do in the bathroom?  Use night lights.  Install grab bars by the toilet and in the tub and shower. Do not use towel bars as grab bars.  Use non-skid mats or decals in the tub or shower.  If you need to sit down in the shower, use a plastic, non-slip stool.  Keep the floor dry. Clean up any water that  spills on the floor as soon as it happens.  Remove soap buildup in the tub or shower regularly.  Attach bath mats securely with double-sided non-slip rug tape.  Do not have throw rugs and other things on the floor that can make you trip. What can I do in the bedroom?  Use night lights.  Make sure that you have a light by your bed that is easy to reach.  Do not use any sheets or blankets that are too big for your bed. They should not hang down onto the floor.  Have a firm chair that has side arms. You can use this for support while you get dressed.  Do not have throw rugs and other things on the floor that can make you trip. What can I do in the kitchen?  Clean up any spills right away.  Avoid walking on wet floors.  Keep items that you use a lot in easy-to-reach places.  If you need to reach something above you, use a strong step stool that has a grab bar.  Keep electrical cords out of the way.  Do not use floor  polish or wax that makes floors slippery. If you must use wax, use non-skid floor wax.  Do not have throw rugs and other things on the floor that can make you trip. What can I do with my stairs?  Do not leave any items on the stairs.  Make sure that there are handrails on both sides of the stairs and use them. Fix handrails that are broken or loose. Make sure that handrails are as long as the stairways.  Check any carpeting to make sure that it is firmly attached to the stairs. Fix any carpet that is loose or worn.  Avoid having throw rugs at the top or bottom of the stairs. If you do have throw rugs, attach them to the floor with carpet tape.  Make sure that you have a light switch at the top of the stairs and the bottom of the stairs. If you do not have them, ask someone to add them for you. What else can I do to help prevent falls?  Wear shoes that:  Do not have high heels.  Have rubber bottoms.  Are comfortable and fit you well.  Are closed at the  toe. Do not wear sandals.  If you use a stepladder:  Make sure that it is fully opened. Do not climb a closed stepladder.  Make sure that both sides of the stepladder are locked into place.  Ask someone to hold it for you, if possible.  Clearly mark and make sure that you can see:  Any grab bars or handrails.  First and last steps.  Where the edge of each step is.  Use tools that help you move around (mobility aids) if they are needed. These include:  Canes.  Walkers.  Scooters.  Crutches.  Turn on the lights when you go into a dark area. Replace any light bulbs as soon as they burn out.  Set up your furniture so you have a clear path. Avoid moving your furniture around.  If any of your floors are uneven, fix them.  If there are any pets around you, be aware of where they are.  Review your medicines with your doctor. Some medicines can make you feel dizzy. This can increase your chance of falling. Ask your doctor what other things that you can do to help prevent falls. This information is not intended to replace advice given to you by your health care provider. Make sure you discuss any questions you have with your health care provider. Document Released: 03/07/2009 Document Revised: 10/17/2015 Document Reviewed: 06/15/2014 Elsevier Interactive Patient Education  2017 [employer].

## 2017-11-02 NOTE — Progress Notes (Signed)
Subjective:   Mackenzye Mackel is a 72 y.o. female who presents for Medicare Annual (Subsequent) preventive examination.  Review of Systems:  N/A  Cardiac Risk Factors include: advanced age (>61men, >66 women);hypertension     Objective:     Vitals: BP (!) 150/76 (BP Location: Right Arm)   Pulse (!) 54   Temp 97.8 F (36.6 C) (Oral)   Ht 5\' 3"  (1.6 m)   Wt 158 lb (71.7 kg)   BMI 27.99 kg/m   Body mass index is 27.99 kg/m.  Advanced Directives 11/02/2017 12/07/2016 11/19/2016 10/27/2016 11/15/2015 11/15/2015 11/07/2015  Does Patient Have a Medical Advance Directive? No No No No No No No  Would patient like information on creating a medical advance directive? Yes (MAU/Ambulatory/Procedural Areas - Information given) - - No - Patient declined Yes - Scientist, clinical (histocompatibility and immunogenetics) given;No - patient declined information No - patient declined information No - patient declined information    Tobacco Social History   Tobacco Use  Smoking Status Former Smoker  . Types: Cigarettes  . Last attempt to quit: 11/29/1990  . Years since quitting: 26.9  Smokeless Tobacco Never Used  Tobacco Comment   quit 1992     Counseling given: Not Answered Comment: quit 1992   Clinical Intake:  Pre-visit preparation completed: Yes  Pain : No/denies pain Pain Score: 0-No pain     Nutritional Status: BMI 25 -29 Overweight Nutritional Risks: None Diabetes: No  How often do you need to have someone help you when you read instructions, pamphlets, or other written materials from your doctor or pharmacy?: 1 - Never  Interpreter Needed?: No  Information entered by :: [facility], LPN  Past Medical History:  Diagnosis Date  . Arthritis   . Diabetes mellitus without complication ([place])    New DX - no meds yet  . Diverticular disease   . Hypertension    Past Surgical History:  Procedure Laterality Date  . CATARACT EXTRACTION    . COLONOSCOPY N/A 10/07/2015   Procedure: COLONOSCOPY;  Surgeon: Lucilla Lame,  MD;  Location: [place];  Service: Endoscopy;  Laterality: N/A;  . DILATION AND CURETTAGE OF UTERUS    . LAPAROSCOPIC SIGMOID COLECTOMY N/A 11/15/2015   Procedure: LAPAROSCOPIC SIGMOID COLECTOMY possible open, possible colostomy;  Surgeon: Jules Husbands, MD;  Location: [facility];  Service: General;  Laterality: N/A;  Please have Hand port ( gelport green by [facility]) ready  . TUBAL LIGATION     Family History  Problem Relation Age of Onset  . Heart disease Mother   . Hypertension Mother   . Aneurysm Mother   . Alcohol abuse Father   . Bladder Cancer Sister   . Hypertension Sister   . Breast cancer Sister 30  . Hypertension Sister   . Diverticulitis Brother   . Hypertension Brother   . Hypertension Sister   . Diabetes Paternal Grandmother   . Heart disease Paternal Grandmother    Social History   Socioeconomic History  . Marital status: Divorced    Spouse name: Not on file  . Number of children: 1  . Years of education: Not on file  . Highest education level: Bachelor's degree (e.g., BA, AB, BS)  Occupational History  . Occupation: retired  Scientific laboratory technician  . Financial resource strain: Not hard at all  . Food insecurity:    Worry: Never true    Inability: Never true  . Transportation needs:    Medical: No    Non-medical: No  Tobacco Use  . Smoking status: Former Smoker    Types: Cigarettes    Last attempt to quit: 11/29/1990    Years since quitting: 26.9  . Smokeless tobacco: Never Used  . Tobacco comment: quit 1992  Substance and Sexual Activity  . Alcohol use: Yes    Alcohol/week: 0.6 - 1.2 oz    Types: 1 - 2 Glasses of wine per week  . Drug use: No  . Sexual activity: Not on file  Lifestyle  . Physical activity:    Days per week: Not on file    Minutes per session: Not on file  . Stress: Not at all  Relationships  . Social connections:    Talks on phone: Not on file    Gets together: Not on file    Attends religious service: Not on file     Active member of club or organization: Not on file    Attends meetings of clubs or organizations: Not on file    Relationship status: Not on file  Other Topics Concern  . Not on file  Social History Narrative  . Not on file    Outpatient Encounter Medications as of 11/02/2017  Medication Sig  . Biotin 10000 MCG TABS Take by mouth daily.   . Cholecalciferol (VITAMIN D) 2000 units CAPS Take by mouth daily.   Wallace Cullens POWD by Does not apply route daily.   . Coenzyme Q10 (CO Q10) 200 MG CAPS Take by mouth daily.   . Ginkgo Biloba 40 MG TABS Take 120 mg by mouth daily.   . hydrochlorothiazide (HYDRODIURIL) 25 MG tablet Take 1 tablet (25 mg total) by mouth daily.  Marland Kitchen lisinopril (PRINIVIL,ZESTRIL) 20 MG tablet Take 1 tablet (20 mg total) by mouth daily.  . milk thistle 175 MG tablet Take 175 mg by mouth daily.   . Multiple Vitamin (MULTIVITAMIN WITH MINERALS) TABS tablet Take 1 tablet by mouth daily.  Marland Kitchen omega-3 acid ethyl esters (LOVAZA) 1 g capsule Take 1 g by mouth daily.  . TURMERIC PO Take by mouth 2 (two) times daily.   Marland Kitchen VITAMIN E PO Take 400 Units by mouth daily.   Marland Kitchen ALPRAZolam (XANAX) 0.5 MG tablet Take 1 tablet (0.5 mg total) by mouth at bedtime as needed for anxiety. (Patient not taking: Reported on 11/02/2017)  . Gymnema Sylvestris Leaf POWD by Does not apply route.   No facility-administered encounter medications on file as of 11/02/2017.     Activities of Daily Living In your present state of health, do you have any difficulty performing the following activities: 11/02/2017  Hearing? N  Vision? N  Difficulty concentrating or making decisions? Y  Walking or climbing stairs? N  Dressing or bathing? N  Doing errands, shopping? N  Preparing Food and eating ? N  Using the Toilet? N  In the past six months, have you accidently leaked urine? N  Do you have problems with loss of bowel control? N  Managing your Medications? N  Managing your Finances? N  Housekeeping or  managing your Housekeeping? N  Some recent data might be hidden    Patient Care Team: Mar Daring, PA-C as PCP - General (Family Medicine) Schnier, Dolores Lory, MD as Consulting Physician (Vascular Surgery) Oneta Rack, MD as Consulting Physician (Dermatology) Pa, [place] as Consulting Physician    Assessment:   This is a routine wellness examination for [place].  Exercise Activities and Dietary recommendations Current Exercise Habits: The patient  does not participate in regular exercise at present, Exercise limited by: None identified  Goals    . Exercise 3x per week (30 min per time)     Recommend increasing exercise to walking 3 days a week for 30 minutes.       Fall Risk Fall Risk  11/02/2017 10/27/2016 09/10/2015 05/17/2015  Falls in the past year? No No No Yes  Number falls in past yr: - - - 1  Injury with Fall? - - - No   Is the patient's home free of loose throw rugs in walkways, pet beds, electrical cords, etc?   yes      Grab bars in the bathroom? yes      Handrails on the stairs?   no      Adequate lighting?   yes  Timed Get Up and Go performed: N/A  Depression Screen PHQ 2/9 Scores 11/02/2017 10/27/2016 10/27/2016 09/10/2015  PHQ - 2 Score 0 0 0 1  PHQ- 9 Score - 4 - -     Cognitive Function: Pt declined screening today.      6CIT Screen 10/27/2016  What Year? 0 points  What month? 0 points  What time? 0 points  Count back from 20 0 points  Months in reverse 0 points  Repeat phrase 4 points  Total Score 4    Immunization History  Administered Date(s) Administered  . Pneumococcal Conjugate-13 09/10/2015  . Pneumococcal Polysaccharide-23 10/27/2016  . Tdap 10/14/2009    Qualifies for Shingles Vaccine? Due for Shingles vaccine. Declined my offer to administer today. Education has been provided regarding the importance of this vaccine. Pt has been advised to call her insurance company to determine her out of pocket expense. Advised  she may also receive this vaccine at her local pharmacy or Health Dept. Verbalized acceptance and understanding.  Screening Tests Health Maintenance  Topic Date Due  . FOOT EXAM  03/10/1958  . OPHTHALMOLOGY EXAM  05/13/2016  . HEMOGLOBIN A1C  04/28/2017  . INFLUENZA VACCINE  12/23/2017  . MAMMOGRAM  10/28/2018  . TETANUS/TDAP  10/15/2019  . COLONOSCOPY  10/06/2025  . DEXA SCAN  Completed  . Hepatitis C Screening  Completed  . PNA vac Low Risk Adult  Completed    Cancer Screenings: Lung: Low Dose CT Chest recommended if Age 24-80 years, 30 pack-year currently smoking OR have quit w/in 15years. Patient does not qualify. Breast:  Up to date on Mammogram? Yes   Up to date of Bone Density/Dexa? Yes Colorectal: Up to date  Additional Screenings:  Hepatitis C Screening: Up to date     Plan:  I have personally reviewed and addressed the Medicare Annual Wellness questionnaire and have noted the following in the patient's chart:  A. Medical and social history B. Use of alcohol, tobacco or illicit drugs  C. Current medications and supplements D. Functional ability and status E.  Nutritional status F.  Physical activity G. Advance directives H. List of other physicians I.  Hospitalizations, surgeries, and ER visits in previous 12 months J.  [place] such as hearing and vision if needed, cognitive and depression L. Referrals and appointments - none  In addition, I have reviewed and discussed with patient certain preventive protocols, quality metrics, and best practice recommendations. A written personalized care plan for preventive services as well as general preventive health recommendations were provided to patient.  See attached scanned questionnaire for additional information.   Signed,  Fabio Neighbors, LPN Nurse Health Advisor  Nurse Recommendations: Pt needs a diabetic foot exam and Hgb A1c checked today. Pt states she has had an eye exam completed this  year. Will contact [facility] to retrieve previous eye exam notes to update HM.

## 2017-11-03 ENCOUNTER — Telehealth: Payer: Self-pay

## 2017-11-03 NOTE — Telephone Encounter (Signed)
Patient advised as below.  

## 2017-11-03 NOTE — Telephone Encounter (Signed)
-----   Message from Mar Daring, PA-C sent at 11/02/2017  6:29 PM EDT ----- Mammogram normal. Repeat in one year.

## 2017-11-05 ENCOUNTER — Telehealth: Payer: Self-pay

## 2017-11-05 LAB — HEMOGLOBIN A1C
Est. average glucose Bld gHb Est-mCnc: 108 mg/dL
Hgb A1c MFr Bld: 5.4 % (ref 4.8–5.6)

## 2017-11-05 LAB — LIPID PANEL
CHOLESTEROL TOTAL: 231 mg/dL — AB (ref 100–199)
Chol/HDL Ratio: 4.1 ratio (ref 0.0–4.4)
HDL: 57 mg/dL (ref >39)
LDL CALC: 150 mg/dL — AB (ref 0–99)
TRIGLYCERIDES: 122 mg/dL (ref 0–149)
VLDL CHOLESTEROL CAL: 24 mg/dL (ref 5–40)

## 2017-11-05 LAB — COMPREHENSIVE METABOLIC PANEL
A/G RATIO: 1.7 (ref 1.2–2.2)
ALT: 21 IU/L (ref 0–32)
AST: 27 IU/L (ref 0–40)
Albumin: 4.6 g/dL (ref 3.6–4.8)
BUN/Creatinine Ratio: 22 (ref 12–28)
BUN: 19 mg/dL (ref 8–27)
Bilirubin Total: 0.8 mg/dL (ref 0.0–1.2)
Creatinine, Ser: 0.86 mg/dL (ref 0.57–1.00)
GFR, EST AFRICAN AMERICAN: 80 mL/min/{1.73_m2} (ref >59)
GFR, EST NON AFRICAN AMERICAN: 69 mL/min/{1.73_m2} (ref >59)
Globulin, Total: 2.7 g/dL (ref 1.5–4.5)
Glucose: 98 mg/dL (ref 65–99)
TOTAL PROTEIN: 7.3 g/dL (ref 6.0–8.5)

## 2017-11-05 LAB — CBC WITH DIFFERENTIAL/PLATELET
BASOS: 0 %
Basophils Absolute: 0 10*3/uL (ref 0.0–0.2)
EOS (ABSOLUTE): 0.2 10*3/uL (ref 0.0–0.4)
EOS: 2 %
Hematocrit: 45.6 % (ref 34.0–46.6)
Hemoglobin: 15.5 g/dL (ref 11.1–15.9)
IMMATURE GRANS (ABS): 0 10*3/uL (ref 0.0–0.1)
IMMATURE GRANULOCYTES: 0 %
LYMPHS: 31 %
Lymphocytes Absolute: 2.2 10*3/uL (ref 0.7–3.1)
MCH: 32.8 pg (ref 26.6–33.0)
MCHC: 34 g/dL (ref 31.5–35.7)
MCV: 96 fL (ref 79–97)
Monocytes Absolute: 0.7 10*3/uL (ref 0.1–0.9)
Monocytes: 10 %
NEUTROS PCT: 57 %
Neutrophils Absolute: 4 10*3/uL (ref 1.4–7.0)
PLATELETS: 216 10*3/uL (ref 150–450)
RBC: 4.73 x10E6/uL (ref 3.77–5.28)
RDW: 14.3 % (ref 12.3–15.4)
WBC: 7.2 10*3/uL (ref 3.4–10.8)

## 2017-11-05 LAB — TSH: TSH: 2.2 u[IU]/mL (ref 0.450–4.500)

## 2017-11-05 NOTE — Telephone Encounter (Signed)
-----   Message from Mar Daring, [place] sent at 11/05/2017  2:03 PM EDT ----- Cholesterol is up from last year. Thyroid is normal. A1c improved and down to 5.4. Kidney normal. Liver enzymes improved and now normal. Some of the electrolyte testing were not completed. I am unsure why. [facility] notes they will be contacting you about recollection if desired. Blood count is normal.

## 2017-11-05 NOTE — Telephone Encounter (Signed)
Patient advised as directed below.  Thanks,  -Joseline 

## 2017-11-22 ENCOUNTER — Encounter (INDEPENDENT_AMBULATORY_CARE_PROVIDER_SITE_OTHER): Payer: Medicare Other

## 2017-11-22 ENCOUNTER — Ambulatory Visit (INDEPENDENT_AMBULATORY_CARE_PROVIDER_SITE_OTHER): Payer: 59 | Admitting: Vascular Surgery

## 2017-12-06 ENCOUNTER — Encounter (INDEPENDENT_AMBULATORY_CARE_PROVIDER_SITE_OTHER): Payer: Medicare Other

## 2017-12-06 ENCOUNTER — Ambulatory Visit (INDEPENDENT_AMBULATORY_CARE_PROVIDER_SITE_OTHER): Payer: Medicare Other | Admitting: Vascular Surgery

## 2017-12-21 DIAGNOSIS — L821 Other seborrheic keratosis: Secondary | ICD-10-CM | POA: Diagnosis not present

## 2017-12-21 DIAGNOSIS — D2271 Melanocytic nevi of right lower limb, including hip: Secondary | ICD-10-CM | POA: Diagnosis not present

## 2017-12-21 DIAGNOSIS — L570 Actinic keratosis: Secondary | ICD-10-CM | POA: Diagnosis not present

## 2017-12-21 DIAGNOSIS — Z08 Encounter for follow-up examination after completed treatment for malignant neoplasm: Secondary | ICD-10-CM | POA: Diagnosis not present

## 2017-12-21 DIAGNOSIS — D2262 Melanocytic nevi of left upper limb, including shoulder: Secondary | ICD-10-CM | POA: Diagnosis not present

## 2017-12-21 DIAGNOSIS — X32XXXA Exposure to sunlight, initial encounter: Secondary | ICD-10-CM | POA: Diagnosis not present

## 2017-12-21 DIAGNOSIS — D2261 Melanocytic nevi of right upper limb, including shoulder: Secondary | ICD-10-CM | POA: Diagnosis not present

## 2017-12-21 DIAGNOSIS — D2272 Melanocytic nevi of left lower limb, including hip: Secondary | ICD-10-CM | POA: Diagnosis not present

## 2017-12-21 DIAGNOSIS — Z85828 Personal history of other malignant neoplasm of skin: Secondary | ICD-10-CM | POA: Diagnosis not present

## 2017-12-24 ENCOUNTER — Other Ambulatory Visit: Payer: Self-pay | Admitting: Physician Assistant

## 2017-12-24 DIAGNOSIS — I10 Essential (primary) hypertension: Secondary | ICD-10-CM

## 2018-04-28 ENCOUNTER — Ambulatory Visit (INDEPENDENT_AMBULATORY_CARE_PROVIDER_SITE_OTHER): Payer: Medicare Other

## 2018-04-28 ENCOUNTER — Ambulatory Visit (INDEPENDENT_AMBULATORY_CARE_PROVIDER_SITE_OTHER): Payer: Medicare Other | Admitting: Vascular Surgery

## 2018-04-28 ENCOUNTER — Encounter (INDEPENDENT_AMBULATORY_CARE_PROVIDER_SITE_OTHER): Payer: Self-pay | Admitting: Vascular Surgery

## 2018-04-28 VITALS — BP 162/78 | HR 60 | Resp 16 | Ht 63.0 in | Wt 159.0 lb

## 2018-04-28 DIAGNOSIS — R7303 Prediabetes: Secondary | ICD-10-CM | POA: Diagnosis not present

## 2018-04-28 DIAGNOSIS — E7800 Pure hypercholesterolemia, unspecified: Secondary | ICD-10-CM | POA: Diagnosis not present

## 2018-04-28 DIAGNOSIS — I6523 Occlusion and stenosis of bilateral carotid arteries: Secondary | ICD-10-CM | POA: Diagnosis not present

## 2018-04-28 DIAGNOSIS — I10 Essential (primary) hypertension: Secondary | ICD-10-CM

## 2018-04-28 NOTE — Progress Notes (Signed)
MRN : [id number]  Nicole Tucker is a 72 y.o. (December 08, 1945) female who presents with chief complaint of  Chief Complaint  Patient presents with  . Follow-up    38month ultrasound  .  History of Present Illness:   The patient is seen for follow up evaluation of carotid stenosis. The carotid stenosis followed by ultrasound.   The patient denies amaurosis fugax. There is no recent history of TIA symptoms or focal motor deficits. There is no prior documented CVA.  The patient is taking enteric-coated aspirin 81 mg daily.  There is no history of migraine headaches. There is no history of seizures.  The patient has a history of coronary artery disease, no recent episodes of angina or shortness of breath. The patient denies PAD or claudication symptoms. There is a history of hyperlipidemia which is being treated with a statin.  Duplex ultrasound of the carotid arteries performed today demonstrates 63-81% RICA and <77% LICA.  No change compared to last visit  Current Meds  Medication Sig  . Biotin 10000 MCG TABS Take by mouth daily.   . Cholecalciferol (VITAMIN D) 2000 units CAPS Take by mouth daily.   Wallace Cullens POWD by Does not apply route daily.   . Coenzyme Q10 (CO Q10) 200 MG CAPS Take by mouth daily.   . Ginkgo Biloba 40 MG TABS Take 120 mg by mouth daily.   Haydee Salter Leaf POWD by Does not apply route.  . hydrochlorothiazide (HYDRODIURIL) 25 MG tablet TAKE 1 TABLET (25 MG TOTAL) BY MOUTH DAILY.  Marland Kitchen lisinopril (PRINIVIL,ZESTRIL) 20 MG tablet TAKE 1 TABLET EVERY DAY  . milk thistle 175 MG tablet Take 175 mg by mouth daily.   . Multiple Vitamin (MULTIVITAMIN WITH MINERALS) TABS tablet Take 1 tablet by mouth daily.  Marland Kitchen omega-3 acid ethyl esters (LOVAZA) 1 g capsule Take 1 g by mouth daily.  . TURMERIC PO Take by mouth 2 (two) times daily.   Marland Kitchen VITAMIN E PO Take 400 Units by mouth daily.     Past Medical History:  Diagnosis Date  . Arthritis   . Diabetes  mellitus without complication ([place])    New DX - no meds yet  . Diverticular disease   . Hypertension     Past Surgical History:  Procedure Laterality Date  . CATARACT EXTRACTION    . COLONOSCOPY N/A 10/07/2015   Procedure: COLONOSCOPY;  Surgeon: Lucilla Lame, MD;  Location: [place];  Service: Endoscopy;  Laterality: N/A;  . DILATION AND CURETTAGE OF UTERUS    . LAPAROSCOPIC SIGMOID COLECTOMY N/A 11/15/2015   Procedure: LAPAROSCOPIC SIGMOID COLECTOMY possible open, possible colostomy;  Surgeon: Jules Husbands, MD;  Location: [facility];  Service: General;  Laterality: N/A;  Please have Hand port ( gelport green by [facility]) ready  . TUBAL LIGATION      Social History Social History   Tobacco Use  . Smoking status: Former Smoker    Types: Cigarettes    Last attempt to quit: 11/29/1990    Years since quitting: 27.4  . Smokeless tobacco: Never Used  . Tobacco comment: quit 1992  Substance Use Topics  . Alcohol use: Yes    Alcohol/week: 1.0 - 2.0 standard drinks    Types: 1 - 2 Glasses of wine per week  . Drug use: No    Family History Family History  Problem Relation Age of Onset  . Heart disease Mother   . Hypertension Mother   . Aneurysm  Mother   . Alcohol abuse Father   . Bladder Cancer Sister   . Hypertension Sister   . Breast cancer Sister 24  . Hypertension Sister   . Diverticulitis Brother   . Hypertension Brother   . Hypertension Sister   . Diabetes Paternal Grandmother   . Heart disease Paternal Grandmother     Allergies  Allergen Reactions  . Codeine Itching and Anxiety    Other reaction(s): Other (See Comments) Nervous     REVIEW OF SYSTEMS (Negative unless checked)  Constitutional: [] Weight loss  [] Fever  [] Chills Cardiac: [] Chest pain   [] Chest pressure   [] Palpitations   [] Shortness of breath when laying flat   [] Shortness of breath with exertion. Vascular:  [] Pain in legs with walking   [] Pain in legs at rest  [] History of DVT    [] Phlebitis   [] Swelling in legs   [] Varicose veins   [] Non-healing ulcers Pulmonary:   [] Uses home oxygen   [] Productive cough   [] Hemoptysis   [] Wheeze  [] COPD   [] Asthma Neurologic:  [] Dizziness   [] Seizures   [] History of stroke   [] History of TIA  [] Aphasia   [] Vissual changes   [] Weakness or numbness in arm   [] Weakness or numbness in leg Musculoskeletal:   [] Joint swelling   [] Joint pain   [] Low back pain Hematologic:  [] Easy bruising  [] Easy bleeding   [] Hypercoagulable state   [] Anemic Gastrointestinal:  [] Diarrhea   [] Vomiting  [] Gastroesophageal reflux/heartburn   [] Difficulty swallowing. Genitourinary:  [] Chronic kidney disease   [] Difficult urination  [] Frequent urination   [] Blood in urine Skin:  [] Rashes   [] Ulcers  Psychological:  [] History of anxiety   []  History of major depression.  Physical Examination  Vitals:   04/28/18 1038  BP: (!) 162/78  Pulse: 60  Resp: 16  Weight: 159 lb (72.1 kg)  Height: 5\' 3"  (1.6 m)   Body mass index is 28.17 kg/m. Gen: WD/WN, NAD Head: [place]/AT, No temporalis wasting.  Ear/Nose/Throat: Hearing grossly intact, nares w/o erythema or drainage Eyes: PER, EOMI, sclera nonicteric.  Neck: Supple, no large masses.   Pulmonary:  Good air movement, no audible wheezing bilaterally, no use of accessory muscles.  Cardiac: RRR, no JVD Vascular:  Bilateral carotid bruits noted Vessel Right Left  Radial Palpable Palpable  Brachial Palpable Palpable  Carotid Palpable Palpable  Gastrointestinal: Non-distended. No guarding/no peritoneal signs.  Musculoskeletal: M/S 5/5 throughout.  No deformity or atrophy.  Neurologic: CN 2-12 intact. Symmetrical.  Speech is fluent. Motor exam as listed above. Psychiatric: Judgment intact, Mood & affect appropriate for pt's clinical situation. Dermatologic: No rashes or ulcers noted.  No changes consistent with cellulitis. Lymph : No lichenification or skin changes of chronic lymphedema.  CBC Lab Results    Component Value Date   WBC 7.2 11/02/2017   HGB 15.5 11/02/2017   HCT 45.6 11/02/2017   MCV 96 11/02/2017   PLT 216 11/02/2017    BMET    Component Value Date/Time   NA CANCELED 11/02/2017 1044   NA 140 12/26/2011 1920   K CANCELED 11/02/2017 1044   K 3.6 12/26/2011 1920   CL CANCELED 11/02/2017 1044   CL 103 12/26/2011 1920   CO2 CANCELED 11/02/2017 1044   CO2 26 12/26/2011 1920   GLUCOSE 98 11/02/2017 1044   GLUCOSE 216 (H) 11/16/2015 0532   GLUCOSE 90 12/26/2011 1920   BUN 19 11/02/2017 1044   BUN 19 (H) 12/26/2011 1920   CREATININE 0.86 11/02/2017 1044  CREATININE 0.76 12/26/2011 1920   CALCIUM CANCELED 11/02/2017 1044   CALCIUM 9.2 12/26/2011 1920   GFRNONAA 69 11/02/2017 1044   GFRNONAA >60 12/26/2011 1920   GFRAA 80 11/02/2017 1044   GFRAA >60 12/26/2011 1920   CrCl cannot be calculated (Patient's most recent lab result is older than the maximum 21 days allowed.).  COAG Lab Results  Component Value Date   INR 1.11 08/31/2015   INR 1.00 08/30/2015    Radiology No results found.   Assessment/Plan 1. Bilateral carotid artery stenosis Recommend:  Given the patient's asymptomatic subcritical stenosis no further invasive testing or surgery at this time.  Duplex ultrasound shows <60% stenosis bilaterally.  Continue antiplatelet therapy as prescribed Continue management of CAD, HTN and Hyperlipidemia Healthy heart diet,  encouraged exercise at least 4 times per week Follow up in 12 months with duplex ultrasound and physical exam   - VAS US CAROTID; Future  2. Benign essential HTN Continue antihypertensive medications as already ordered, these medications have been reviewed and there are no changes at this time.  3. Borderline diabetes Continue hypoglycemic medications as already ordered, these medications have been reviewed and there are no changes at this time.  Hgb A1C to be monitored as already arranged by primary service  4.  Hypercholesterolemia without hypertriglyceridemia Continue statin as ordered and reviewed, no changes at this time     Hortencia Pilar, MD  04/28/2018 10:42 AM

## 2018-06-27 DIAGNOSIS — D0439 Carcinoma in situ of skin of other parts of face: Secondary | ICD-10-CM | POA: Diagnosis not present

## 2018-06-27 DIAGNOSIS — L570 Actinic keratosis: Secondary | ICD-10-CM | POA: Diagnosis not present

## 2018-06-27 DIAGNOSIS — Z08 Encounter for follow-up examination after completed treatment for malignant neoplasm: Secondary | ICD-10-CM | POA: Diagnosis not present

## 2018-06-27 DIAGNOSIS — L821 Other seborrheic keratosis: Secondary | ICD-10-CM | POA: Diagnosis not present

## 2018-06-27 DIAGNOSIS — D485 Neoplasm of uncertain behavior of skin: Secondary | ICD-10-CM | POA: Diagnosis not present

## 2018-06-27 DIAGNOSIS — Z85828 Personal history of other malignant neoplasm of skin: Secondary | ICD-10-CM | POA: Diagnosis not present

## 2018-07-07 ENCOUNTER — Other Ambulatory Visit: Payer: Self-pay

## 2018-07-07 DIAGNOSIS — I10 Essential (primary) hypertension: Secondary | ICD-10-CM

## 2018-07-07 MED ORDER — HYDROCHLOROTHIAZIDE 25 MG PO TABS: 25.0000 mg | ORAL_TABLET | Freq: Every day | ORAL | 3 refills | Status: DC

## 2018-07-07 MED ORDER — LISINOPRIL 20 MG PO TABS: 20.0000 mg | ORAL_TABLET | Freq: Every day | ORAL | 3 refills | Status: DC

## 2018-08-11 DIAGNOSIS — D0439 Carcinoma in situ of skin of other parts of face: Secondary | ICD-10-CM | POA: Diagnosis not present

## 2018-08-11 DIAGNOSIS — X32XXXA Exposure to sunlight, initial encounter: Secondary | ICD-10-CM | POA: Diagnosis not present

## 2018-08-11 DIAGNOSIS — L570 Actinic keratosis: Secondary | ICD-10-CM | POA: Diagnosis not present

## 2018-10-25 ENCOUNTER — Other Ambulatory Visit: Payer: Self-pay

## 2018-10-25 ENCOUNTER — Other Ambulatory Visit: Payer: Self-pay | Admitting: Physician Assistant

## 2018-10-25 DIAGNOSIS — I10 Essential (primary) hypertension: Secondary | ICD-10-CM

## 2018-10-25 DIAGNOSIS — Z1231 Encounter for screening mammogram for malignant neoplasm of breast: Secondary | ICD-10-CM

## 2018-10-25 MED ORDER — HYDROCHLOROTHIAZIDE 25 MG PO TABS: 25.0000 mg | ORAL_TABLET | Freq: Every day | ORAL | 3 refills | Status: DC

## 2018-10-25 MED ORDER — LISINOPRIL 20 MG PO TABS: 20.0000 mg | ORAL_TABLET | Freq: Every day | ORAL | 3 refills | Status: DC

## 2018-11-07 ENCOUNTER — Ambulatory Visit: Payer: Medicare Other

## 2018-11-07 ENCOUNTER — Encounter: Payer: Medicare Other | Admitting: Physician Assistant

## 2018-12-19 DIAGNOSIS — D485 Neoplasm of uncertain behavior of skin: Secondary | ICD-10-CM | POA: Diagnosis not present

## 2018-12-19 DIAGNOSIS — D2262 Melanocytic nevi of left upper limb, including shoulder: Secondary | ICD-10-CM | POA: Diagnosis not present

## 2018-12-19 DIAGNOSIS — X32XXXA Exposure to sunlight, initial encounter: Secondary | ICD-10-CM | POA: Diagnosis not present

## 2018-12-19 DIAGNOSIS — Z85828 Personal history of other malignant neoplasm of skin: Secondary | ICD-10-CM | POA: Diagnosis not present

## 2018-12-19 DIAGNOSIS — L821 Other seborrheic keratosis: Secondary | ICD-10-CM | POA: Diagnosis not present

## 2018-12-19 DIAGNOSIS — L570 Actinic keratosis: Secondary | ICD-10-CM | POA: Diagnosis not present

## 2018-12-19 DIAGNOSIS — L820 Inflamed seborrheic keratosis: Secondary | ICD-10-CM | POA: Diagnosis not present

## 2018-12-19 DIAGNOSIS — D0439 Carcinoma in situ of skin of other parts of face: Secondary | ICD-10-CM | POA: Diagnosis not present

## 2018-12-19 DIAGNOSIS — D2272 Melanocytic nevi of left lower limb, including hip: Secondary | ICD-10-CM | POA: Diagnosis not present

## 2018-12-19 DIAGNOSIS — D2261 Melanocytic nevi of right upper limb, including shoulder: Secondary | ICD-10-CM | POA: Diagnosis not present

## 2019-02-03 ENCOUNTER — Other Ambulatory Visit: Payer: Self-pay

## 2019-02-03 ENCOUNTER — Encounter: Payer: Self-pay | Admitting: Physician Assistant

## 2019-02-03 DIAGNOSIS — I10 Essential (primary) hypertension: Secondary | ICD-10-CM

## 2019-02-03 MED ORDER — LISINOPRIL 20 MG PO TABS: 20.0000 mg | ORAL_TABLET | Freq: Every day | ORAL | 3 refills | Status: DC

## 2019-02-03 MED ORDER — HYDROCHLOROTHIAZIDE 25 MG PO TABS: 25.0000 mg | ORAL_TABLET | Freq: Every day | ORAL | 3 refills | Status: DC

## 2019-02-03 MED ORDER — HYDROCHLOROTHIAZIDE 25 MG PO TABS: 25.0000 mg | ORAL_TABLET | Freq: Every day | ORAL | 0 refills | Status: DC

## 2019-02-03 MED ORDER — LISINOPRIL 20 MG PO TABS: 20.0000 mg | ORAL_TABLET | Freq: Every day | ORAL | 0 refills | Status: DC

## 2019-02-08 ENCOUNTER — Encounter: Payer: Self-pay | Admitting: Physician Assistant

## 2019-02-08 DIAGNOSIS — L578 Other skin changes due to chronic exposure to nonionizing radiation: Secondary | ICD-10-CM | POA: Diagnosis not present

## 2019-02-08 DIAGNOSIS — Z85828 Personal history of other malignant neoplasm of skin: Secondary | ICD-10-CM | POA: Diagnosis not present

## 2019-02-08 DIAGNOSIS — D0439 Carcinoma in situ of skin of other parts of face: Secondary | ICD-10-CM | POA: Diagnosis not present

## 2019-03-02 NOTE — Progress Notes (Signed)
Subjective:   Nicole Tucker is a 73 y.o. female who presents for Medicare Annual (Subsequent) preventive examination.    This visit is being conducted through telemedicine due to the COVID-19 pandemic. This patient has given me verbal consent via doximity to conduct this visit, patient states they are participating from their home address. Some vital signs may be absent or patient reported.    Patient identification: identified by name, DOB, and current address  Review of Systems:  N/A  Cardiac Risk Factors include: advanced age (>82men, >35 women);hypertension     Objective:     Vitals: There were no vitals taken for this visit.  There is no height or weight on file to calculate BMI. Unable to obtain vitals due to visit being conducted via telephonically.   Advanced Directives 03/06/2019 11/02/2017 12/07/2016 11/19/2016 10/27/2016 11/15/2015 11/15/2015  Does Patient Have a Medical Advance Directive? No No No No No No No  Would patient like information on creating a medical advance directive? No - Patient declined Yes (MAU/Ambulatory/Procedural Areas - Information given) - - No - Patient declined Yes - Scientist, clinical (histocompatibility and immunogenetics) given;No - patient declined information No - patient declined information    Tobacco Social History   Tobacco Use  Smoking Status Former Smoker  . Types: Cigarettes  . Quit date: 11/29/1990  . Years since quitting: 28.2  Smokeless Tobacco Never Used  Tobacco Comment   quit 1992     Counseling given: Not Answered Comment: quit 1992   Clinical Intake:  Pre-visit preparation completed: Yes  Pain : No/denies pain Pain Score: 0-No pain     Nutritional Risks: None Diabetes: No(prediabetic)  How often do you need to have someone help you when you read instructions, pamphlets, or other written materials from your doctor or pharmacy?: 1 - Never  Interpreter Needed?: No  Information entered by :: [facility], LPN  Past Medical History:  Diagnosis Date  .  Arthritis   . Diabetes mellitus without complication ([place])    New DX - no meds yet  . Diverticular disease   . Hypertension    Past Surgical History:  Procedure Laterality Date  . CATARACT EXTRACTION    . COLONOSCOPY N/A 10/07/2015   Procedure: COLONOSCOPY;  Surgeon: Lucilla Lame, MD;  Location: [facility];  Service: Endoscopy;  Laterality: N/A;  . DILATION AND CURETTAGE OF UTERUS    . LAPAROSCOPIC SIGMOID COLECTOMY N/A 11/15/2015   Procedure: LAPAROSCOPIC SIGMOID COLECTOMY possible open, possible colostomy;  Surgeon: Jules Husbands, MD;  Location: [facility];  Service: General;  Laterality: N/A;  Please have Hand port ( gelport green by [facility]) ready  . TUBAL LIGATION     Family History  Problem Relation Age of Onset  . Heart disease Mother   . Hypertension Mother   . Aneurysm Mother   . Alcohol abuse Father   . Bladder Cancer Sister   . Hypertension Sister   . Breast cancer Sister 72  . Hypertension Sister   . Diverticulitis Brother   . Hypertension Brother   . Hypertension Sister   . Diabetes Paternal Grandmother   . Heart disease Paternal Grandmother    Social History   Socioeconomic History  . Marital status: Divorced    Spouse name: Not on file  . Number of children: 1  . Years of education: Not on file  . Highest education level: Bachelor's degree (e.g., BA, AB, BS)  Occupational History  . Occupation: retired  Scientific laboratory technician  . Financial resource strain:  Not hard at all  . Food insecurity    Worry: Never true    Inability: Never true  . Transportation needs    Medical: No    Non-medical: No  Tobacco Use  . Smoking status: Former Smoker    Types: Cigarettes    Quit date: 11/29/1990    Years since quitting: 28.2  . Smokeless tobacco: Never Used  . Tobacco comment: quit 1992  Substance and Sexual Activity  . Alcohol use: Yes    Alcohol/week: 1.0 - 2.0 standard drinks    Types: 1 - 2 Glasses of wine per week  . Drug use: No  . Sexual activity:  Not on file  Lifestyle  . Physical activity    Days per week: 0 days    Minutes per session: 0 min  . Stress: Not at all  Relationships  . Social Herbalist on phone: Patient refused    Gets together: Patient refused    Attends religious service: Patient refused    Active member of club or organization: Patient refused    Attends meetings of clubs or organizations: Patient refused    Relationship status: Patient refused  Other Topics Concern  . Not on file  Social History Narrative  . Not on file    Outpatient Encounter Medications as of 03/06/2019  Medication Sig  . Biotin 10000 MCG TABS Take by mouth. Takes occasionally  . Cholecalciferol (VITAMIN D) 2000 units CAPS Take by mouth daily.   Wallace Cullens POWD by Does not apply route daily.   . Coenzyme Q10 (CO Q10) 200 MG CAPS Take by mouth daily.   . fluorouracil (EFUDEX) 5 % cream APPLY TO NOSE AND LIP TWICE DAILY FOR FOUR WEEKS  . Ginkgo Biloba 40 MG TABS Take 120 mg by mouth daily.   Haydee Salter Leaf POWD by Does not apply route.  . hydrochlorothiazide (HYDRODIURIL) 25 MG tablet Take 1 tablet (25 mg total) by mouth daily.  Marland Kitchen lisinopril (ZESTRIL) 20 MG tablet Take 1 tablet (20 mg total) by mouth daily.  . milk thistle 175 MG tablet Take 175 mg by mouth daily.   . Multiple Vitamin (MULTIVITAMIN WITH MINERALS) TABS tablet Take 1 tablet by mouth daily.  Marland Kitchen omega-3 acid ethyl esters (LOVAZA) 1 g capsule Take 1 g by mouth daily.  . TURMERIC PO Take by mouth 2 (two) times daily.   Marland Kitchen VITAMIN E PO Take 400 Units by mouth daily.   Marland Kitchen ALPRAZolam (XANAX) 0.5 MG tablet Take 1 tablet (0.5 mg total) by mouth at bedtime as needed for anxiety. (Patient not taking: Reported on 11/02/2017)   No facility-administered encounter medications on file as of 03/06/2019.     Activities of Daily Living In your present state of health, do you have any difficulty performing the following activities: 03/06/2019  Hearing? N  Vision?  N  Difficulty concentrating or making decisions? N  Walking or climbing stairs? N  Dressing or bathing? N  Doing errands, shopping? N  Preparing Food and eating ? N  Using the Toilet? N  In the past six months, have you accidently leaked urine? N  Do you have problems with loss of bowel control? N  Managing your Medications? N  Managing your Finances? N  Housekeeping or managing your Housekeeping? N  Some recent data might be hidden    Patient Care Team: Mar Daring, PA-C as PCP - General (Family Medicine) Schnier, Dolores Lory, MD as Consulting Physician (  Vascular Surgery) Oneta Rack, MD as Consulting Physician (Dermatology) Pa, Amelia as Consulting Physician    Assessment:   This is a routine wellness examination for Anquinette.  Exercise Activities and Dietary recommendations Current Exercise Habits: The patient does not participate in regular exercise at present, Exercise limited by: None identified  Goals    . Exercise 3x per week (30 min per time)     Recommend increasing exercise to walking 3 days a week for 30 minutes.       Fall Risk: Fall Risk  03/06/2019 11/02/2017 10/27/2016 09/10/2015 05/17/2015  Falls in the past year? 0 No No No Yes  Number falls in past yr: 0 - - - 1  Injury with Fall? 0 - - - No    FALL RISK PREVENTION PERTAINING TO THE HOME:  Any stairs in or around the home? Yes  If so, are there any without handrails? Yes, pt to work on getting handrails.   Home free of loose throw rugs in walkways, pet beds, electrical cords, etc? Yes  Adequate lighting in your home to reduce risk of falls? Yes   ASSISTIVE DEVICES UTILIZED TO PREVENT FALLS:  Life alert? No  Use of a cane, walker or w/c? No  Grab bars in the bathroom? Yes  Shower chair or bench in shower? No  Elevated toilet seat or a handicapped toilet? No    TIMED UP AND GO:  Was the test performed? No .    Depression Screen PHQ 2/9 Scores 03/06/2019 11/02/2017  10/27/2016 10/27/2016  PHQ - 2 Score 0 0 0 0  PHQ- 9 Score - - 4 -     Cognitive Function: Declined today.      6CIT Screen 10/27/2016  What Year? 0 points  What month? 0 points  What time? 0 points  Count back from 20 0 points  Months in reverse 0 points  Repeat phrase 4 points  Total Score 4    Immunization History  Administered Date(s) Administered  . Pneumococcal Conjugate-13 09/10/2015  . Pneumococcal Polysaccharide-23 10/27/2016  . Tdap 10/14/2009    Qualifies for Shingles Vaccine? Yes . Due for Shingrix. Education has been provided regarding the importance of this vaccine. Pt has been advised to call insurance company to determine out of pocket expense. Advised may also receive vaccine at local pharmacy or Health Dept. Verbalized acceptance and understanding.  Tdap: Up to date  Flu Vaccine: Due for Flu vaccine. Does the patient want to receive this vaccine today?  No .   Pneumococcal Vaccine: Completed series  Screening Tests Health Maintenance  Topic Date Due  . INFLUENZA VACCINE  08/23/2019 (Originally 12/24/2018)  . TETANUS/TDAP  10/15/2019  . MAMMOGRAM  11/03/2019  . DEXA SCAN  10/14/2020  . COLONOSCOPY  10/06/2025  . Hepatitis C Screening  Completed  . PNA vac Low Risk Adult  Completed    Cancer Screenings:  Colorectal Screening: Completed 10/07/15. Repeat every 10 years.  Mammogram: Completed 11/02/17.   Bone Density: Completed 10/15/15. Results reflect OSTEOPENIA. Repeat every 5 years.   Lung Cancer Screening: (Low Dose CT Chest recommended if Age 36-80 years, 30 pack-year currently smoking OR have quit w/in 15years.) does not qualify.   Additional Screening:  Hepatitis C Screening: Up to date  Vision Screening: Recommended annual ophthalmology exams for early detection of glaucoma and other disorders of the eye.  Dental Screening: Recommended annual dental exams for proper oral hygiene  Community Resource Referral:  CRR required this  visit?  No        Plan:  I have personally reviewed and addressed the Medicare Annual Wellness questionnaire and have noted the following in the patient's chart:  A. Medical and social history B. Use of alcohol, tobacco or illicit drugs  C. Current medications and supplements D. Functional ability and status E.  Nutritional status F.  Physical activity G. Advance directives H. List of other physicians I.  Hospitalizations, surgeries, and ER visits in previous 12 months J.  [place] such as hearing and vision if needed, cognitive and depression L. Referrals and appointments   In addition, I have reviewed and discussed with patient certain preventive protocols, quality metrics, and best practice recommendations. A written personalized care plan for preventive services as well as general preventive health recommendations were provided to patient. Nurse Health Advisor  Signed,    Roshana Shuffield [place], [place]  [id number] Nurse Health Advisor   Nurse Notes: Pt declined a future flu shot.

## 2019-03-06 ENCOUNTER — Ambulatory Visit (INDEPENDENT_AMBULATORY_CARE_PROVIDER_SITE_OTHER): Payer: Medicare Other

## 2019-03-06 ENCOUNTER — Encounter: Payer: Medicare Other | Admitting: Physician Assistant

## 2019-03-06 ENCOUNTER — Other Ambulatory Visit: Payer: Self-pay

## 2019-03-06 DIAGNOSIS — Z0000 Encounter for general adult medical examination without abnormal findings: Secondary | ICD-10-CM

## 2019-03-06 NOTE — Patient Instructions (Addendum)
[name] , Thank you for taking time to come for your Medicare Wellness Visit. I appreciate your ongoing commitment to your health goals. Please review the following plan we discussed and let me know if I can assist you in the future.   Screening recommendations/referrals: Colonoscopy: Up to date, due 09/2025 Mammogram: Up to date, due 10/2019 Bone Density: Up to date, due 09/2025 Recommended yearly ophthalmology/optometry visit for glaucoma screening and checkup Recommended yearly dental visit for hygiene and checkup  Vaccinations: Influenza vaccine: Pt declines today. Currently due. Pneumococcal vaccine: Completed series Tdap vaccine: Up to date, due 09/2019 Shingles vaccine: Pt declines today.     Advanced directives: Advance directive discussed with you today. Even though you declined this today please call our office should you change your mind and we can give you the proper paperwork for you to fill out.  Conditions/risks identified: Recommend to start walking 3 days a week for at least 30 minutes at a time.   Next appointment: 04/10/19 @ 11:00 AM with Fenton Malling. Declined scheduling an AWV for 2021 at this time.    Preventive Care 71 Years and Older, Female Preventive care refers to lifestyle choices and visits with your health care provider that can promote health and wellness. What does preventive care include?  A yearly physical exam. This is also called an annual well check.  Dental exams once or twice a year.  Routine eye exams. Ask your health care provider how often you should have your eyes checked.  Personal lifestyle choices, including:  Daily care of your teeth and gums.  Regular physical activity.  Eating a healthy diet.  Avoiding tobacco and drug use.  Limiting alcohol use.  Practicing safe sex.  Taking low-dose aspirin every day.  Taking vitamin and mineral supplements as recommended by your health care provider. What happens during an annual  well check? The services and screenings done by your health care provider during your annual well check will depend on your age, overall health, lifestyle risk factors, and family history of disease. Counseling  Your health care provider may ask you questions about your:  Alcohol use.  Tobacco use.  Drug use.  Emotional well-being.  Home and relationship well-being.  Sexual activity.  Eating habits.  History of falls.  Memory and ability to understand (cognition).  Work and work Statistician.  Reproductive health. Screening  You may have the following tests or measurements:  Height, weight, and BMI.  Blood pressure.  Lipid and cholesterol levels. These may be checked every 5 years, or more frequently if you are over 44 years old.  Skin check.  Lung cancer screening. You may have this screening every year starting at age 33 if you have a 30-pack-year history of smoking and currently smoke or have quit within the past 15 years.  Fecal occult blood test (FOBT) of the stool. You may have this test every year starting at age 71.  Flexible sigmoidoscopy or colonoscopy. You may have a sigmoidoscopy every 5 years or a colonoscopy every 10 years starting at age 81.  Hepatitis C blood test.  Hepatitis B blood test.  Sexually transmitted disease (STD) testing.  Diabetes screening. This is done by checking your blood sugar (glucose) after you have not eaten for a while (fasting). You may have this done every 1-3 years.  Bone density scan. This is done to screen for osteoporosis. You may have this done starting at age 41.  Mammogram. This may be done every 1-2 years.  Talk to your health care provider about how often you should have regular mammograms. Talk with your health care provider about your test results, treatment options, and if necessary, the need for more tests. Vaccines  Your health care provider may recommend certain vaccines, such as:  Influenza vaccine. This  is recommended every year.  Tetanus, diphtheria, and acellular pertussis (Tdap, Td) vaccine. You may need a Td booster every 10 years.  Zoster vaccine. You may need this after age 73.  Pneumococcal 13-valent conjugate (PCV13) vaccine. One dose is recommended after age 71.  Pneumococcal polysaccharide (PPSV23) vaccine. One dose is recommended after age 84. Talk to your health care provider about which screenings and vaccines you need and how often you need them. This information is not intended to replace advice given to you by your health care provider. Make sure you discuss any questions you have with your health care provider. Document Released: 06/07/2015 Document Revised: 01/29/2016 Document Reviewed: 03/12/2015 Elsevier Interactive Patient Education  2017 [place] Prevention in the Home Falls can cause injuries. They can happen to people of all ages. There are many things you can do to make your home safe and to help prevent falls. What can I do on the outside of my home?  Regularly fix the edges of walkways and driveways and fix any cracks.  Remove anything that might make you trip as you walk through a door, such as a raised step or threshold.  Trim any bushes or trees on the path to your home.  Use bright outdoor lighting.  Clear any walking paths of anything that might make someone trip, such as rocks or tools.  Regularly check to see if handrails are loose or broken. Make sure that both sides of any steps have handrails.  Any raised decks and porches should have guardrails on the edges.  Have any leaves, snow, or ice cleared regularly.  Use sand or salt on walking paths during winter.  Clean up any spills in your garage right away. This includes oil or grease spills. What can I do in the bathroom?  Use night lights.  Install grab bars by the toilet and in the tub and shower. Do not use towel bars as grab bars.  Use non-skid mats or decals in the tub or  shower.  If you need to sit down in the shower, use a plastic, non-slip stool.  Keep the floor dry. Clean up any water that spills on the floor as soon as it happens.  Remove soap buildup in the tub or shower regularly.  Attach bath mats securely with double-sided non-slip rug tape.  Do not have throw rugs and other things on the floor that can make you trip. What can I do in the bedroom?  Use night lights.  Make sure that you have a light by your bed that is easy to reach.  Do not use any sheets or blankets that are too big for your bed. They should not hang down onto the floor.  Have a firm chair that has side arms. You can use this for support while you get dressed.  Do not have throw rugs and other things on the floor that can make you trip. What can I do in the kitchen?  Clean up any spills right away.  Avoid walking on wet floors.  Keep items that you use a lot in easy-to-reach places.  If you need to reach something above you, use a strong step stool  that has a grab bar.  Keep electrical cords out of the way.  Do not use floor polish or wax that makes floors slippery. If you must use wax, use non-skid floor wax.  Do not have throw rugs and other things on the floor that can make you trip. What can I do with my stairs?  Do not leave any items on the stairs.  Make sure that there are handrails on both sides of the stairs and use them. Fix handrails that are broken or loose. Make sure that handrails are as long as the stairways.  Check any carpeting to make sure that it is firmly attached to the stairs. Fix any carpet that is loose or worn.  Avoid having throw rugs at the top or bottom of the stairs. If you do have throw rugs, attach them to the floor with carpet tape.  Make sure that you have a light switch at the top of the stairs and the bottom of the stairs. If you do not have them, ask someone to add them for you. What else can I do to help prevent falls?   Wear shoes that:  Do not have high heels.  Have rubber bottoms.  Are comfortable and fit you well.  Are closed at the toe. Do not wear sandals.  If you use a stepladder:  Make sure that it is fully opened. Do not climb a closed stepladder.  Make sure that both sides of the stepladder are locked into place.  Ask someone to hold it for you, if possible.  Clearly mark and make sure that you can see:  Any grab bars or handrails.  First and last steps.  Where the edge of each step is.  Use tools that help you move around (mobility aids) if they are needed. These include:  Canes.  Walkers.  Scooters.  Crutches.  Turn on the lights when you go into a dark area. Replace any light bulbs as soon as they burn out.  Set up your furniture so you have a clear path. Avoid moving your furniture around.  If any of your floors are uneven, fix them.  If there are any pets around you, be aware of where they are.  Review your medicines with your doctor. Some medicines can make you feel dizzy. This can increase your chance of falling. Ask your doctor what other things that you can do to help prevent falls. This information is not intended to replace advice given to you by your health care provider. Make sure you discuss any questions you have with your health care provider. Document Released: 03/07/2009 Document Revised: 10/17/2015 Document Reviewed: 06/15/2014 Elsevier Interactive Patient Education  2017 [employer].

## 2019-04-07 NOTE — Progress Notes (Signed)
Patient: Nicole Tucker Female    DOB: 11-Sep-1945   73 y.o.   MRN: [id number] Visit Date: 04/10/2019  Today's Provider: Mar Daring, PA-C   Chief Complaint  Patient presents with  . Follow-up    HTN and Hypercholesterolemia   Subjective:     Patient had her AWV on 03/06/19 with [facility].  HPI  Benign essential HTN: Stable. Continue HCTZ 25mg  and lisinopril 20mg . Is adherent to low salt diet. She is walking some. Keeps active.  Hypercholesterolemia without hypertriglyceridemia:Diet controlled.  Hip Pain:She reports the hip pain is worsening. Left hip. No injury. It hurst after sitting for a long time or standing.   Allergies  Allergen Reactions  . Codeine Itching and Anxiety    Other reaction(s): Other (See Comments) Nervous     Current Outpatient Medications:  .  Biotin 10000 MCG TABS, Take by mouth. Takes occasionally, Disp: , Rfl:  .  Cholecalciferol (VITAMIN D) 2000 units CAPS, Take by mouth daily. , Disp: , Rfl:  .  Cinnamon Bark POWD, by Does not apply route daily. , Disp: , Rfl:  .  Coenzyme Q10 (CO Q10) 200 MG CAPS, Take by mouth daily. , Disp: , Rfl:  .  Ginkgo Biloba 40 MG TABS, Take 120 mg by mouth daily. , Disp: , Rfl:  .  Gymnema Sylvestris Leaf POWD, by Does not apply route., Disp: , Rfl:  .  hydrochlorothiazide (HYDRODIURIL) 25 MG tablet, Take 1 tablet (25 mg total) by mouth daily., Disp: 90 tablet, Rfl: 3 .  lisinopril (ZESTRIL) 20 MG tablet, Take 1 tablet (20 mg total) by mouth daily., Disp: 90 tablet, Rfl: 3 .  milk thistle 175 MG tablet, Take 175 mg by mouth daily. , Disp: , Rfl:  .  Multiple Vitamin (MULTIVITAMIN WITH MINERALS) TABS tablet, Take 1 tablet by mouth daily., Disp: , Rfl:  .  omega-3 acid ethyl esters (LOVAZA) 1 g capsule, Take 1 g by mouth daily., Disp: , Rfl:  .  TURMERIC PO, Take by mouth 2 (two) times daily. , Disp: , Rfl:  .  VITAMIN E PO, Take 400 Units by mouth daily. , Disp: , Rfl:  .  fluorouracil (EFUDEX) 5 % cream, APPLY  TO NOSE AND LIP TWICE DAILY FOR FOUR WEEKS, Disp: , Rfl:   Review of Systems  Constitutional: Negative for appetite change, chills, fatigue and fever.  HENT: Negative.   Eyes: Negative.   Respiratory: Negative for chest tightness and shortness of breath.   Cardiovascular: Negative for chest pain and palpitations.  Gastrointestinal: Negative for abdominal pain, nausea and vomiting.  Endocrine: Negative.   Genitourinary: Negative.   Musculoskeletal: Negative.   Skin: Negative.   Allergic/Immunologic: Negative.   Neurological: Negative for dizziness, weakness and headaches.  Hematological: Negative.   Psychiatric/Behavioral: Negative.     Social History   Tobacco Use  . Smoking status: Former Smoker    Types: Cigarettes    Quit date: 11/29/1990    Years since quitting: 28.3  . Smokeless tobacco: Never Used  . Tobacco comment: quit 1992  Substance Use Topics  . Alcohol use: Yes    Alcohol/week: 1.0 - 2.0 standard drinks    Types: 1 - 2 Glasses of wine per week      Objective:   BP (!) 151/77 (BP Location: Left Arm, Patient Position: Sitting, Cuff Size: Large)   Pulse 60   Temp (!) 97.3 F (36.3 C) (Temporal)   Resp 16   Ht  5\' 3"  (1.6 m)   Wt 175 lb 12.8 oz (79.7 kg)   BMI 31.14 kg/m  Vitals:   04/10/19 1119  BP: (!) 151/77  Pulse: 60  Resp: 16  Temp: (!) 97.3 F (36.3 C)  TempSrc: Temporal  Weight: 175 lb 12.8 oz (79.7 kg)  Height: 5\' 3"  (1.6 m)  Body mass index is 31.14 kg/m.   Physical Exam Vitals signs reviewed.  Constitutional:      General: She is not in acute distress.    Appearance: Normal appearance. She is well-developed. She is obese. She is not ill-appearing or diaphoretic.  HENT:     Head: Normocephalic and atraumatic.     Right Ear: Tympanic membrane, ear canal and external ear normal.     Left Ear: Tympanic membrane, ear canal and external ear normal.     Nose: Nose normal.     Mouth/Throat:     Mouth: Mucous membranes are moist.      Pharynx: No oropharyngeal exudate.  Eyes:     General: No scleral icterus.       Right eye: No discharge.        Left eye: No discharge.     Extraocular Movements: Extraocular movements intact.     Conjunctiva/sclera: Conjunctivae normal.     Pupils: Pupils are equal, round, and reactive to light.  Neck:     Musculoskeletal: Normal range of motion and neck supple.     Thyroid: No thyromegaly.     Vascular: Carotid bruit (bilaterally) present. No JVD.     Trachea: No tracheal deviation.  Cardiovascular:     Rate and Rhythm: Normal rate and regular rhythm.     Pulses: Normal pulses.     Heart sounds: Normal heart sounds. No murmur. No friction rub. No gallop.   Pulmonary:     Effort: Pulmonary effort is normal. No respiratory distress.     Breath sounds: Normal breath sounds. No wheezing or rales.  Chest:     Chest wall: No tenderness.  Abdominal:     General: Abdomen is flat. Bowel sounds are normal. There is no distension.     Palpations: Abdomen is soft. There is no mass.     Tenderness: There is no abdominal tenderness. There is no guarding or rebound.  Musculoskeletal: Normal range of motion.        General: No tenderness.     Right lower leg: No edema.     Left lower leg: No edema.  Lymphadenopathy:     Cervical: No cervical adenopathy.  Skin:    General: Skin is warm and dry.     Capillary Refill: Capillary refill takes less than 2 seconds.     Findings: No rash.  Neurological:     General: No focal deficit present.     Mental Status: She is alert and oriented to person, place, and time. Mental status is at baseline.  Psychiatric:        Mood and Affect: Mood normal.        Behavior: Behavior normal.        Thought Content: Thought content normal.        Judgment: Judgment normal.     No results found for any visits on 04/10/19.     Assessment & Plan    1. Benign essential HTN Slightly elevated today. Continue Lisinopril 20mg  and HCTZ 25mg  daily. Patient is  going to start checking her BP at home again and will call if consistently >140/90.  Will check labs as below and f/u pending results. - CBC w/Diff/Platelet - Comprehensive Metabolic Panel (CMET) - Lipid Profile - HgB A1c  2. Bilateral carotid artery stenosis Due for annual f/u with Vascular surgery in December. - CBC w/Diff/Platelet - Comprehensive Metabolic Panel (CMET) - Lipid Profile - HgB A1c  3. Borderline diabetes Diet controlled. Will check labs as below and f/u pending results.  - CBC w/Diff/Platelet - Comprehensive Metabolic Panel (CMET) - Lipid Profile - HgB A1c  4. Hypercholesterolemia without hypertriglyceridemia Patient has declined statins in the past. Diet controlled. Will check labs as below and f/u pending results. - CBC w/Diff/Platelet - Comprehensive Metabolic Panel (CMET) - Lipid Profile - HgB A1c     Mar Daring, PA-C  [facility]

## 2019-04-10 ENCOUNTER — Encounter: Payer: Self-pay | Admitting: Physician Assistant

## 2019-04-10 ENCOUNTER — Ambulatory Visit
Admission: RE | Admit: 2019-04-10 | Discharge: 2019-04-10 | Disposition: A | Discharge status: Home / Self Care | Payer: Medicare Other | Source: Ambulatory Visit | Attending: Physician Assistant | Admitting: Physician Assistant

## 2019-04-10 ENCOUNTER — Other Ambulatory Visit: Payer: Self-pay

## 2019-04-10 ENCOUNTER — Ambulatory Visit (INDEPENDENT_AMBULATORY_CARE_PROVIDER_SITE_OTHER): Payer: Medicare Other | Admitting: Physician Assistant

## 2019-04-10 VITALS — BP 151/77 | HR 60 | Temp 97.3°F | Resp 16 | Ht 63.0 in | Wt 175.8 lb

## 2019-04-10 DIAGNOSIS — E7800 Pure hypercholesterolemia, unspecified: Secondary | ICD-10-CM

## 2019-04-10 DIAGNOSIS — Z1231 Encounter for screening mammogram for malignant neoplasm of breast: Secondary | ICD-10-CM | POA: Insufficient documentation

## 2019-04-10 DIAGNOSIS — I10 Essential (primary) hypertension: Secondary | ICD-10-CM

## 2019-04-10 DIAGNOSIS — R7303 Prediabetes: Secondary | ICD-10-CM

## 2019-04-10 DIAGNOSIS — I6523 Occlusion and stenosis of bilateral carotid arteries: Secondary | ICD-10-CM

## 2019-04-10 NOTE — Patient Instructions (Signed)
Health Maintenance After Age 71 After age 71, you are at a higher risk for certain long-term diseases and infections as well as injuries from falls. Falls are a major cause of broken bones and head injuries in people who are older than age 71. Getting regular preventive care can help to keep you healthy and well. Preventive care includes getting regular testing and making lifestyle changes as recommended by your health care provider. Talk with your health care provider about:  Which screenings and tests you should have. A screening is a test that checks for a disease when you have no symptoms.  A diet and exercise plan that is right for you. What should I know about screenings and tests to prevent falls? Screening and testing are the best ways to find a health problem early. Early diagnosis and treatment give you the best chance of managing medical conditions that are common after age 71. Certain conditions and lifestyle choices may make you more likely to have a fall. Your health care provider may recommend:  Regular vision checks. Poor vision and conditions such as cataracts can make you more likely to have a fall. If you wear glasses, make sure to get your prescription updated if your vision changes.  Medicine review. Work with your health care provider to regularly review all of the medicines you are taking, including over-the-counter medicines. Ask your health care provider about any side effects that may make you more likely to have a fall. Tell your health care provider if any medicines that you take make you feel dizzy or sleepy.  Osteoporosis screening. Osteoporosis is a condition that causes the bones to get weaker. This can make the bones weak and cause them to break more easily.  Blood pressure screening. Blood pressure changes and medicines to control blood pressure can make you feel dizzy.  Strength and balance checks. Your health care provider may recommend certain tests to check your  strength and balance while standing, walking, or changing positions.  Foot health exam. Foot pain and numbness, as well as not wearing proper footwear, can make you more likely to have a fall.  Depression screening. You may be more likely to have a fall if you have a fear of falling, feel emotionally low, or feel unable to do activities that you used to do.  Alcohol use screening. Using too much alcohol can affect your balance and may make you more likely to have a fall. What actions can I take to lower my risk of falls? General instructions  Talk with your health care provider about your risks for falling. Tell your health care provider if: ? You fall. Be sure to tell your health care provider about all falls, even ones that seem minor. ? You feel dizzy, sleepy, or off-balance.  Take over-the-counter and prescription medicines only as told by your health care provider. These include any supplements.  Eat a healthy diet and maintain a healthy weight. A healthy diet includes low-fat dairy products, low-fat (lean) meats, and fiber from whole grains, beans, and lots of fruits and vegetables. Home safety  Remove any tripping hazards, such as rugs, cords, and clutter.  Install safety equipment such as grab bars in bathrooms and safety rails on stairs.  Keep rooms and walkways well-lit. Activity   Follow a regular exercise program to stay fit. This will help you maintain your balance. Ask your health care provider what types of exercise are appropriate for you.  If you need a cane or   walker, use it as recommended by your health care provider.  Wear supportive shoes that have nonskid soles. Lifestyle  Do not drink alcohol if your health care provider tells you not to drink.  If you drink alcohol, limit how much you have: ? 0-1 drink a day for women. ? 0-2 drinks a day for men.  Be aware of how much alcohol is in your drink. In the [place] one drink equals one typical bottle of beer (12  oz), one-half glass of wine (5 oz), or one shot of hard liquor (1 oz).  Do not use any products that contain nicotine or tobacco, such as cigarettes and e-cigarettes. If you need help quitting, ask your health care provider. Summary  Having a healthy lifestyle and getting preventive care can help to protect your health and wellness after age 71.  Screening and testing are the best way to find a health problem early and help you avoid having a fall. Early diagnosis and treatment give you the best chance for managing medical conditions that are more common for people who are older than age 71.  Falls are a major cause of broken bones and head injuries in people who are older than age 71. Take precautions to prevent a fall at home.  Work with your health care provider to learn what changes you can make to improve your health and wellness and to prevent falls. This information is not intended to replace advice given to you by your health care provider. Make sure you discuss any questions you have with your health care provider. Document Released: 03/24/2017 Document Revised: 09/01/2018 Document Reviewed: 03/24/2017 Elsevier Patient Education  2020 Elsevier Inc[employer]  

## 2019-04-11 ENCOUNTER — Encounter: Payer: Self-pay | Admitting: Physician Assistant

## 2019-04-11 ENCOUNTER — Telehealth: Payer: Self-pay

## 2019-04-11 LAB — LIPID PANEL
Chol/HDL Ratio: 3.8 ratio (ref 0.0–4.4)
Cholesterol, Total: 208 mg/dL — ABNORMAL HIGH (ref 100–199)
HDL: 55 mg/dL (ref >39)
LDL Chol Calc (NIH): 133 mg/dL — ABNORMAL HIGH (ref 0–99)
Triglycerides: 114 mg/dL (ref 0–149)
VLDL Cholesterol Cal: 20 mg/dL (ref 5–40)

## 2019-04-11 LAB — COMPREHENSIVE METABOLIC PANEL
ALT: 78 IU/L — ABNORMAL HIGH (ref 0–32)
AST: 50 IU/L — ABNORMAL HIGH (ref 0–40)
Albumin/Globulin Ratio: 1.5 (ref 1.2–2.2)
Albumin: 4.5 g/dL (ref 3.7–4.7)
Alkaline Phosphatase: 63 IU/L (ref 39–117)
BUN/Creatinine Ratio: 15 (ref 12–28)
BUN: 12 mg/dL (ref 8–27)
Bilirubin Total: 0.8 mg/dL (ref 0.0–1.2)
CO2: 26 mmol/L (ref 20–29)
Calcium: 9.7 mg/dL (ref 8.7–10.3)
Chloride: 99 mmol/L (ref 96–106)
Creatinine, Ser: 0.81 mg/dL (ref 0.57–1.00)
GFR calc Af Amer: 85 mL/min/{1.73_m2} (ref >59)
GFR calc non Af Amer: 73 mL/min/{1.73_m2} (ref >59)
Globulin, Total: 3 g/dL (ref 1.5–4.5)
Glucose: 106 mg/dL — ABNORMAL HIGH (ref 65–99)
Potassium: 4.3 mmol/L (ref 3.5–5.2)
Sodium: 139 mmol/L (ref 134–144)
Total Protein: 7.5 g/dL (ref 6.0–8.5)

## 2019-04-11 LAB — CBC WITH DIFFERENTIAL/PLATELET
Basophils Absolute: 0.1 10*3/uL (ref 0.0–0.2)
Basos: 1 %
EOS (ABSOLUTE): 0.2 10*3/uL (ref 0.0–0.4)
Eos: 2 %
Hematocrit: 45.9 % (ref 34.0–46.6)
Hemoglobin: 15.5 g/dL (ref 11.1–15.9)
Immature Grans (Abs): 0 10*3/uL (ref 0.0–0.1)
Immature Granulocytes: 0 %
Lymphocytes Absolute: 2.3 10*3/uL (ref 0.7–3.1)
Lymphs: 32 %
MCH: 32.4 pg (ref 26.6–33.0)
MCHC: 33.8 g/dL (ref 31.5–35.7)
MCV: 96 fL (ref 79–97)
Monocytes Absolute: 0.8 10*3/uL (ref 0.1–0.9)
Monocytes: 12 %
Neutrophils Absolute: 3.8 10*3/uL (ref 1.4–7.0)
Neutrophils: 53 %
Platelets: 250 10*3/uL (ref 150–450)
RBC: 4.78 x10E6/uL (ref 3.77–5.28)
RDW: 12.7 % (ref 11.7–15.4)
WBC: 7.2 10*3/uL (ref 3.4–10.8)

## 2019-04-11 LAB — HEMOGLOBIN A1C
Est. average glucose Bld gHb Est-mCnc: 123 mg/dL
Hgb A1c MFr Bld: 5.9 % — ABNORMAL HIGH (ref 4.8–5.6)

## 2019-04-11 NOTE — Telephone Encounter (Signed)
-----   Message from Mar Daring, [place] sent at 04/10/2019  2:06 PM EST ----- Normal mammogram. Repeat screening in one year.

## 2019-04-11 NOTE — Telephone Encounter (Signed)
Patient advised as directed below. 

## 2019-04-12 ENCOUNTER — Telehealth: Payer: Self-pay

## 2019-04-12 NOTE — Telephone Encounter (Signed)
-----   Message from Mar Daring, PA-C sent at 04/11/2019  1:25 PM EST ----- Blood count is stable. Kidney function is normal. Liver enzymes are up compared to last year. Recommend limiting fatty foods, tylenol (acetaminophen) based products and alcohol. Recheck in 2-3 months. Sodium, potassium and calcium are normal. Cholesterol has improved compared to last year. Still borderline high though. A1c also up compared to last year to 5.9. Work on healthy lifestyle modifications with dieting and exercise. Limiting fatty foods, processed foods, red meats and sugars can help.

## 2019-04-12 NOTE — Telephone Encounter (Signed)
Pt advised.   Thanks,   -Cung Masterson  

## 2019-04-13 ENCOUNTER — Encounter: Payer: Self-pay | Admitting: Physician Assistant

## 2019-04-29 ENCOUNTER — Encounter: Payer: Self-pay | Admitting: Physician Assistant

## 2019-04-29 DIAGNOSIS — I10 Essential (primary) hypertension: Secondary | ICD-10-CM

## 2019-05-01 ENCOUNTER — Encounter (INDEPENDENT_AMBULATORY_CARE_PROVIDER_SITE_OTHER): Payer: Medicare Other

## 2019-05-01 ENCOUNTER — Ambulatory Visit (INDEPENDENT_AMBULATORY_CARE_PROVIDER_SITE_OTHER): Payer: Medicare Other | Admitting: Nurse Practitioner

## 2019-05-01 MED ORDER — HYDROCHLOROTHIAZIDE 25 MG PO TABS: 25.0000 mg | ORAL_TABLET | Freq: Every day | ORAL | 3 refills | Status: DC

## 2019-05-01 MED ORDER — LISINOPRIL 20 MG PO TABS: 20.0000 mg | ORAL_TABLET | Freq: Every day | ORAL | 3 refills | Status: DC

## 2019-05-24 ENCOUNTER — Ambulatory Visit (INDEPENDENT_AMBULATORY_CARE_PROVIDER_SITE_OTHER): Payer: Medicare Other | Admitting: Nurse Practitioner

## 2019-05-24 ENCOUNTER — Encounter (INDEPENDENT_AMBULATORY_CARE_PROVIDER_SITE_OTHER): Payer: Medicare Other

## 2019-06-22 DIAGNOSIS — X32XXXA Exposure to sunlight, initial encounter: Secondary | ICD-10-CM | POA: Diagnosis not present

## 2019-06-22 DIAGNOSIS — Z85828 Personal history of other malignant neoplasm of skin: Secondary | ICD-10-CM | POA: Diagnosis not present

## 2019-06-22 DIAGNOSIS — D2262 Melanocytic nevi of left upper limb, including shoulder: Secondary | ICD-10-CM | POA: Diagnosis not present

## 2019-06-22 DIAGNOSIS — D485 Neoplasm of uncertain behavior of skin: Secondary | ICD-10-CM | POA: Diagnosis not present

## 2019-06-22 DIAGNOSIS — D2272 Melanocytic nevi of left lower limb, including hip: Secondary | ICD-10-CM | POA: Diagnosis not present

## 2019-06-22 DIAGNOSIS — L814 Other melanin hyperpigmentation: Secondary | ICD-10-CM | POA: Diagnosis not present

## 2019-06-22 DIAGNOSIS — D0439 Carcinoma in situ of skin of other parts of face: Secondary | ICD-10-CM | POA: Diagnosis not present

## 2019-06-22 DIAGNOSIS — D2261 Melanocytic nevi of right upper limb, including shoulder: Secondary | ICD-10-CM | POA: Diagnosis not present

## 2019-06-22 DIAGNOSIS — L570 Actinic keratosis: Secondary | ICD-10-CM | POA: Diagnosis not present

## 2019-07-05 DIAGNOSIS — M533 Sacrococcygeal disorders, not elsewhere classified: Secondary | ICD-10-CM | POA: Insufficient documentation

## 2019-07-05 DIAGNOSIS — M7541 Impingement syndrome of right shoulder: Secondary | ICD-10-CM | POA: Diagnosis not present

## 2019-07-28 ENCOUNTER — Encounter: Payer: Self-pay | Admitting: Physician Assistant

## 2019-07-28 DIAGNOSIS — I10 Essential (primary) hypertension: Secondary | ICD-10-CM

## 2019-07-28 MED ORDER — LISINOPRIL 40 MG PO TABS: 40.0000 mg | ORAL_TABLET | Freq: Every day | ORAL | 3 refills | Status: DC

## 2019-07-28 MED ORDER — HYDROCHLOROTHIAZIDE 25 MG PO TABS: 25.0000 mg | ORAL_TABLET | Freq: Every day | ORAL | 3 refills | Status: DC

## 2019-07-28 NOTE — Addendum Note (Signed)
Addended by: Mar Daring on: 07/28/2019 01:54 PM   Modules accepted: Orders

## 2019-08-03 MED ORDER — LISINOPRIL 40 MG PO TABS: 40.0000 mg | ORAL_TABLET | Freq: Every day | ORAL | 3 refills | Status: DC

## 2019-08-03 NOTE — Addendum Note (Signed)
Addended by: Mar Daring on: 08/03/2019 05:02 PM   Modules accepted: Orders

## 2019-10-05 ENCOUNTER — Other Ambulatory Visit (INDEPENDENT_AMBULATORY_CARE_PROVIDER_SITE_OTHER): Payer: Self-pay | Admitting: Vascular Surgery

## 2019-10-05 ENCOUNTER — Encounter: Payer: Self-pay | Admitting: Physician Assistant

## 2019-10-05 DIAGNOSIS — I779 Disorder of arteries and arterioles, unspecified: Secondary | ICD-10-CM

## 2019-10-05 DIAGNOSIS — I10 Essential (primary) hypertension: Secondary | ICD-10-CM

## 2019-10-05 MED ORDER — LISINOPRIL 20 MG PO TABS: 30.0000 mg | ORAL_TABLET | Freq: Every day | ORAL | 3 refills | Status: DC

## 2019-10-09 ENCOUNTER — Ambulatory Visit (INDEPENDENT_AMBULATORY_CARE_PROVIDER_SITE_OTHER): Payer: Medicare Other

## 2019-10-09 ENCOUNTER — Other Ambulatory Visit: Payer: Self-pay

## 2019-10-09 ENCOUNTER — Encounter (INDEPENDENT_AMBULATORY_CARE_PROVIDER_SITE_OTHER): Payer: Self-pay | Admitting: Nurse Practitioner

## 2019-10-09 ENCOUNTER — Ambulatory Visit (INDEPENDENT_AMBULATORY_CARE_PROVIDER_SITE_OTHER): Payer: Medicare Other | Admitting: Nurse Practitioner

## 2019-10-09 VITALS — BP 164/78 | HR 62 | Resp 16 | Wt 176.8 lb

## 2019-10-09 DIAGNOSIS — I6523 Occlusion and stenosis of bilateral carotid arteries: Secondary | ICD-10-CM | POA: Diagnosis not present

## 2019-10-09 DIAGNOSIS — I10 Essential (primary) hypertension: Secondary | ICD-10-CM

## 2019-10-09 DIAGNOSIS — I779 Disorder of arteries and arterioles, unspecified: Secondary | ICD-10-CM

## 2019-10-09 DIAGNOSIS — E7800 Pure hypercholesterolemia, unspecified: Secondary | ICD-10-CM

## 2019-10-09 NOTE — Progress Notes (Signed)
Subjective:    Patient ID: Nicole Tucker, female    DOB: [birth year], 74 y.o.   MRN: [id number] Chief Complaint  Patient presents with  . Follow-up    ultrasound follow up    The patient is seen for follow up evaluation of carotid stenosis. The carotid stenosis followed by ultrasound.   The patient denies amaurosis fugax. There is no recent history of TIA symptoms or focal motor deficits. There is no prior documented CVA.  The patient is taking enteric-coated aspirin 81 mg daily.  There is no history of migraine headaches. There is no history of seizures.  The patient has a history of coronary artery disease, no recent episodes of angina or shortness of breath. The patient denies PAD or claudication symptoms. There is a history of hyperlipidemia which is being treated with a statin.    Duplex ultrasound shows 60-79% right internal carotid artery stenosis, with a left 40 to 59% internal carotid artery stenosis.   Review of Systems  All other systems reviewed and are negative.      Objective:   Physical Exam Vitals reviewed.  Constitutional:      Appearance: Normal appearance.  HENT:     Head: Normocephalic.  Cardiovascular:     Rate and Rhythm: Normal rate and regular rhythm.     Pulses: Normal pulses.          Carotid pulses are on the right side with bruit and on the left side with bruit.    Heart sounds: Murmur present.  Neurological:     Mental Status: She is alert and oriented to person, place, and time.  Psychiatric:        Mood and Affect: Mood normal.        Behavior: Behavior normal.        Thought Content: Thought content normal.        Judgment: Judgment normal.     BP (!) 164/78 (BP Location: Left Arm)   Pulse 62   Resp 16   Wt 176 lb 12.8 oz (80.2 kg)   BMI 31.32 kg/m   Past Medical History:  Diagnosis Date  . Arthritis   . Diabetes mellitus without complication ([place])    New DX - no meds yet  . Diverticular disease   . Hypertension      Social History   Socioeconomic History  . Marital status: Divorced    Spouse name: Not on file  . Number of children: 1  . Years of education: Not on file  . Highest education level: Bachelor's degree (e.g., BA, AB, BS)  Occupational History  . Occupation: retired  Tobacco Use  . Smoking status: Former Smoker    Types: Cigarettes    Quit date: 11/29/1990    Years since quitting: 28.8  . Smokeless tobacco: Never Used  . Tobacco comment: quit 1992  Substance and Sexual Activity  . Alcohol use: Yes    Alcohol/week: 1.0 - 2.0 standard drinks    Types: 1 - 2 Glasses of wine per week  . Drug use: No  . Sexual activity: Not on file  Other Topics Concern  . Not on file  Social History Narrative  . Not on file   Social Determinants of Health   Financial Resource Strain:   . Difficulty of Paying Living Expenses:   Food Insecurity:   . Worried About Charity fundraiser in the Last Year:   . [place] in the Last Year:  Transportation Needs:   . Film/video editor (Medical):   Marland Kitchen Lack of Transportation (Non-Medical):   Physical Activity: Inactive  . Days of Exercise per Week: 0 days  . Minutes of Exercise per Session: 0 min  Stress:   . Feeling of Stress :   Social Connections: Unknown  . Frequency of Communication with Friends and Family: Patient refused  . Frequency of Social Gatherings with Friends and Family: Patient refused  . Attends Religious Services: Patient refused  . Active Member of Clubs or Organizations: Patient refused  . Attends Archivist Meetings: Patient refused  . Marital Status: Patient refused  Intimate Partner Violence: Unknown  . Fear of Current or Ex-Partner: Patient refused  . Emotionally Abused: Patient refused  . Physically Abused: Patient refused  . Sexually Abused: Patient refused    Past Surgical History:  Procedure Laterality Date  . CATARACT EXTRACTION    . COLONOSCOPY N/A 10/07/2015   Procedure: COLONOSCOPY;   Surgeon: Lucilla Lame, MD;  Location: [facility];  Service: Endoscopy;  Laterality: N/A;  . DILATION AND CURETTAGE OF UTERUS    . LAPAROSCOPIC SIGMOID COLECTOMY N/A 11/15/2015   Procedure: LAPAROSCOPIC SIGMOID COLECTOMY possible open, possible colostomy;  Surgeon: Jules Husbands, MD;  Location: [facility];  Service: General;  Laterality: N/A;  Please have Hand port ( gelport green by [facility]) ready  . TUBAL LIGATION      Family History  Problem Relation Age of Onset  . Heart disease Mother   . Hypertension Mother   . Aneurysm Mother   . Alcohol abuse Father   . Bladder Cancer Sister   . Hypertension Sister   . Breast cancer Sister 64  . Hypertension Sister   . Diverticulitis Brother   . Hypertension Brother   . Hypertension Sister   . Diabetes Paternal Grandmother   . Heart disease Paternal Grandmother     Allergies  Allergen Reactions  . Codeine Itching and Anxiety    Other reaction(s): Other (See Comments) Nervous       Assessment & Plan:   1. Bilateral carotid artery stenosis Recommend:  Given the patient's asymptomatic subcritical stenosis no further invasive testing or surgery at this time.  Duplex ultrasound shows 60-79% right internal carotid artery stenosis, with a left 40 to 59% internal carotid artery stenosis   Continue antiplatelet therapy as prescribed Continue management of CAD, HTN and Hyperlipidemia Healthy heart diet,  encouraged exercise at least 4 times per week Follow up in 6 months with duplex ultrasound and physical exam  - VAS US CAROTID; Future  2. Benign essential HTN Continue antihypertensive medications as already ordered, these medications have been reviewed and there are no changes at this time.   3. Hypercholesterolemia without hypertriglyceridemia Continue statin as ordered and reviewed, no changes at this time    Current Outpatient Medications on File Prior to Visit  Medication Sig Dispense Refill  . Biotin 10000  MCG TABS Take by mouth. Takes occasionally    . Cholecalciferol (VITAMIN D) 2000 units CAPS Take by mouth daily.     Wallace Cullens POWD by Does not apply route daily.     . Coenzyme Q10 (CO Q10) 200 MG CAPS Take by mouth daily.     . fluorouracil (EFUDEX) 5 % cream APPLY TO NOSE AND LIP TWICE DAILY FOR FOUR WEEKS    . Ginkgo Biloba 40 MG TABS Take 120 mg by mouth daily.     Haydee Salter Leaf POWD  by Does not apply route.    . hydrochlorothiazide (HYDRODIURIL) 25 MG tablet Take 1 tablet (25 mg total) by mouth daily. 90 tablet 3  . lisinopril (ZESTRIL) 20 MG tablet Take 1.5 tablets (30 mg total) by mouth daily. 135 tablet 3  . milk thistle 175 MG tablet Take 175 mg by mouth daily.     . Multiple Vitamin (MULTIVITAMIN WITH MINERALS) TABS tablet Take 1 tablet by mouth daily.    Marland Kitchen omega-3 acid ethyl esters (LOVAZA) 1 g capsule Take 1 g by mouth daily.    . TURMERIC PO Take by mouth 2 (two) times daily.     Marland Kitchen VITAMIN E PO Take 400 Units by mouth daily.      No current facility-administered medications on file prior to visit.    There are no Patient Instructions on file for this visit. No follow-ups on file.   Kris Hartmann, NP

## 2019-11-20 ENCOUNTER — Encounter: Payer: Self-pay | Admitting: Physician Assistant

## 2019-12-28 ENCOUNTER — Encounter: Payer: Self-pay | Admitting: Physician Assistant

## 2019-12-29 ENCOUNTER — Encounter: Payer: Self-pay | Admitting: Physician Assistant

## 2019-12-29 ENCOUNTER — Other Ambulatory Visit: Payer: Self-pay

## 2019-12-29 ENCOUNTER — Ambulatory Visit (INDEPENDENT_AMBULATORY_CARE_PROVIDER_SITE_OTHER): Payer: Medicare Other | Admitting: Physician Assistant

## 2019-12-29 VITALS — BP 139/77 | HR 69 | Temp 97.9°F | Resp 16 | Wt 180.0 lb

## 2019-12-29 DIAGNOSIS — M25552 Pain in left hip: Secondary | ICD-10-CM | POA: Diagnosis not present

## 2019-12-29 DIAGNOSIS — I6523 Occlusion and stenosis of bilateral carotid arteries: Secondary | ICD-10-CM

## 2019-12-29 MED ORDER — METHYLPREDNISOLONE 4 MG PO TBPK: ORAL_TABLET | ORAL | 0 refills | Status: DC

## 2019-12-29 NOTE — Progress Notes (Signed)
Established patient visit   Patient: Nicole Tucker   DOB: 1945/08/28   74 y.o. Female  MRN: [id number] Visit Date: 12/29/2019  Today's healthcare provider: Mar Daring, PA-C   Chief Complaint  Patient presents with  . Leg Pain   Subjective    Leg Pain  The incident occurred more than 1 week ago (for 8 weeks). There was no injury mechanism. The pain is present in the left hip, left leg and left knee (L inner thigh-groin). The quality of the pain is described as aching. The pain is at a severity of 8/10. The pain is moderate. The pain has been fluctuating since onset. Pertinent negatives include no inability to bear weight, numbness or tingling. She reports no foreign bodies present. The symptoms are aggravated by movement. She has tried NSAIDs (IBU) for the symptoms. The treatment provided mild relief.    Patient Active Problem List   Diagnosis Date Noted  . Bilateral carotid artery stenosis 11/19/2016  . Diverticulitis large intestine 11/15/2015  . Other intestinal obstruction   . Diverticulitis of large intestine without perforation or abscess without bleeding   . Biceps tendinitis 08/14/2015  . Impingement syndrome of shoulder region 08/14/2015  . Anxiety 07/03/2015  . Diverticulitis 07/03/2015  . Benign essential HTN 07/03/2015  . Colon polyp 07/03/2015  . Borderline diabetes 07/03/2015  . Hypercholesterolemia without hypertriglyceridemia 07/03/2015  . Calculus of kidney 10/23/2013  . Microscopic hematuria 10/23/2013  . Kidney lump 10/23/2013   Past Medical History:  Diagnosis Date  . Arthritis   . Diabetes mellitus without complication ([place])    New DX - no meds yet  . Diverticular disease   . Hypertension        Medications: Outpatient Medications Prior to Visit  Medication Sig  . Ascorbic Acid (VITAMIN C PO) Take by mouth.  . B Complex Vitamins (VITAMIN B COMPLEX PO) Take by mouth.  . Cholecalciferol (VITAMIN D) 2000 units CAPS Take by mouth daily.    . Coenzyme Q10 (CO Q10) 200 MG CAPS Take by mouth daily.   . fluorouracil (EFUDEX) 5 % cream APPLY TO NOSE AND LIP TWICE DAILY FOR FOUR WEEKS  . Ginkgo Biloba 40 MG TABS Take 120 mg by mouth daily.   Haydee Salter Leaf POWD by Does not apply route.  . hydrochlorothiazide (HYDRODIURIL) 25 MG tablet Take 1 tablet (25 mg total) by mouth daily.  Marland Kitchen lisinopril (ZESTRIL) 20 MG tablet Take 1.5 tablets (30 mg total) by mouth daily.  . milk thistle 175 MG tablet Take 175 mg by mouth daily.   . Multiple Vitamin (MULTIVITAMIN WITH MINERALS) TABS tablet Take 1 tablet by mouth daily.  . Multiple Vitamins-Minerals (ZINC PO) Take by mouth.  . omega-3 acid ethyl esters (LOVAZA) 1 g capsule Take 1 g by mouth daily.  . TURMERIC PO Take by mouth 2 (two) times daily.   Marland Kitchen VITAMIN A PO Take by mouth.  Marland Kitchen VITAMIN E PO Take 400 Units by mouth daily.   . Biotin 10000 MCG TABS Take by mouth. Takes occasionally  . Cinnamon Bark POWD by Does not apply route daily.    No facility-administered medications prior to visit.    Review of Systems  Constitutional: Negative.   Respiratory: Negative for chest tightness, shortness of breath and wheezing.   Cardiovascular: Negative for chest pain, palpitations and leg swelling.  Musculoskeletal: Positive for arthralgias, back pain, gait problem and myalgias.  Neurological: Negative for tingling and numbness.  Last CBC Lab Results  Component Value Date   WBC 7.2 04/10/2019   HGB 15.5 04/10/2019   HCT 45.9 04/10/2019   MCV 96 04/10/2019   MCH 32.4 04/10/2019   RDW 12.7 04/10/2019   PLT 250 57/32/2025   Last metabolic panel Lab Results  Component Value Date   GLUCOSE 106 (H) 04/10/2019   NA 139 04/10/2019   K 4.3 04/10/2019   CL 99 04/10/2019   CO2 26 04/10/2019   BUN 12 04/10/2019   CREATININE 0.81 04/10/2019   GFRNONAA 73 04/10/2019   GFRAA 85 04/10/2019   CALCIUM 9.7 04/10/2019   PHOS 3.8 08/31/2015   PROT 7.5 04/10/2019   ALBUMIN 4.5  04/10/2019   LABGLOB 3.0 04/10/2019   AGRATIO 1.5 04/10/2019   BILITOT 0.8 04/10/2019   ALKPHOS 63 04/10/2019   AST 50 (H) 04/10/2019   ALT 78 (H) 04/10/2019   ANIONGAP 9 11/16/2015   Last lipids Lab Results  Component Value Date   CHOL 208 (H) 04/10/2019   HDL 55 04/10/2019   LDLCALC 133 (H) 04/10/2019   TRIG 114 04/10/2019   CHOLHDL 3.8 04/10/2019      Objective    BP 139/77 (BP Location: Left Arm, Patient Position: Sitting, Cuff Size: Large)   Pulse 69   Temp 97.9 F (36.6 C) (Oral)   Resp 16   Wt 180 lb (81.6 kg)   BMI 31.89 kg/m  BP Readings from Last 3 Encounters:  12/29/19 139/77  10/09/19 (!) 164/78  04/10/19 (!) 151/77   Wt Readings from Last 3 Encounters:  12/29/19 180 lb (81.6 kg)  10/09/19 176 lb 12.8 oz (80.2 kg)  04/10/19 175 lb 12.8 oz (79.7 kg)      Physical Exam Vitals reviewed.  Constitutional:      General: She is not in acute distress.    Appearance: Normal appearance. She is well-developed. She is obese. She is not ill-appearing or diaphoretic.  Cardiovascular:     Rate and Rhythm: Normal rate and regular rhythm.     Heart sounds: Normal heart sounds. No murmur heard.  No friction rub. No gallop.   Pulmonary:     Effort: Pulmonary effort is normal. No respiratory distress.     Breath sounds: Normal breath sounds. No wheezing or rales.  Musculoskeletal:     Cervical back: Normal range of motion and neck supple.  Neurological:     Mental Status: She is alert.       No results found for any visits on 12/29/19.  Assessment & Plan     1. Left hip pain Has had something similar earlier this year in February 2021 and was seen by Dr. Earnestine Leys. She reports he gave her two medications and exercises and symptoms had improved. Now over the last two months or so the pain has been returning. Pain radiates around the left hip, radiates down the lateral thigh to the left lateral knee. Previously was diagnosed with left SI dysfunction. I feel  this is same issue with some IT band involvement. She reports sitting, lying down and even walking do not bother the hip. Pain is aggravated with forward flexion of the back, the change of position from sitting to standing (forward flexion to extension) and then ER of the left hip. No xrays obtained as she had some in February 2021 with Dr. Sabra Heck, I am unable to see the results. Will give medrol as below since patient has been using IBU without complete relief. Will place  new referral back to Dr. Sabra Heck. She is to call if symptoms worsen in the meantime prior to appt with ortho.  - methylPREDNISolone (MEDROL) 4 MG TBPK tablet; 6 day taper; take as directed on instructions  Dispense: 21 tablet; Refill: 0 - Ambulatory referral to Orthopedic Surgery   No follow-ups on file.      Reynolds Bowl, PA-C, have reviewed all documentation for this visit. The documentation on 12/29/19 for the exam, diagnosis, procedures, and orders are all accurate and complete.   [place]ye [place]  [facility] [phone] (phone) [phone] (fax)  Marie

## 2019-12-29 NOTE — Patient Instructions (Signed)
Hip Pain The hip is the joint between the upper legs and the lower pelvis. The bones, cartilage, tendons, and muscles of your hip joint support your body and allow you to move around. Hip pain can range from a minor ache to severe pain in one or both of your hips. The pain may be felt on the inside of the hip joint near the groin, or on the outside near the buttocks and upper thigh. You may also have swelling or stiffness in your hip area. Follow these instructions at home: Managing pain, stiffness, and swelling      If directed, put ice on the painful area. To do this: ? Put ice in a plastic bag. ? Place a towel between your skin and the bag. ? Leave the ice on for 20 minutes, 2-3 times a day.  If directed, apply heat to the affected area as often as told by your health care provider. Use the heat source that your health care provider recommends, such as a moist heat pack or a heating pad. ? Place a towel between your skin and the heat source. ? Leave the heat on for 20-30 minutes. ? Remove the heat if your skin turns bright red. This is especially important if you are unable to feel pain, heat, or cold. You may have a greater risk of getting burned. Activity  Do exercises as told by your health care provider.  Avoid activities that cause pain. General instructions   Take over-the-counter and prescription medicines only as told by your health care provider.  Keep a journal of your symptoms. Write down: ? How often you have hip pain. ? The location of your pain. ? What the pain feels like. ? What makes the pain worse.  Sleep with a pillow between your legs on your most comfortable side.  Keep all follow-up visits as told by your health care provider. This is important. Contact a health care provider if:  You cannot put weight on your leg.  Your pain or swelling continues or gets worse after one week.  It gets harder to walk.  You have a fever. Get help right away  if:  You fall.  You have a sudden increase in pain and swelling in your hip.  Your hip is red or swollen or very tender to touch. Summary  Hip pain can range from a minor ache to severe pain in one or both of your hips.  The pain may be felt on the inside of the hip joint near the groin, or on the outside near the buttocks and upper thigh.  Avoid activities that cause pain.  Write down how often you have hip pain, the location of the pain, what makes it worse, and what it feels like. This information is not intended to replace advice given to you by your health care provider. Make sure you discuss any questions you have with your health care provider. Document Revised: 09/26/2018 Document Reviewed: 09/26/2018 Elsevier Patient Education  2020 [employer] -- 

## 2020-01-01 ENCOUNTER — Encounter: Payer: Self-pay | Admitting: Physician Assistant

## 2020-01-04 ENCOUNTER — Encounter: Payer: Self-pay | Admitting: Physician Assistant

## 2020-02-13 DIAGNOSIS — X32XXXA Exposure to sunlight, initial encounter: Secondary | ICD-10-CM | POA: Diagnosis not present

## 2020-02-13 DIAGNOSIS — L820 Inflamed seborrheic keratosis: Secondary | ICD-10-CM | POA: Diagnosis not present

## 2020-02-13 DIAGNOSIS — D2272 Melanocytic nevi of left lower limb, including hip: Secondary | ICD-10-CM | POA: Diagnosis not present

## 2020-02-13 DIAGNOSIS — L538 Other specified erythematous conditions: Secondary | ICD-10-CM | POA: Diagnosis not present

## 2020-02-13 DIAGNOSIS — D225 Melanocytic nevi of trunk: Secondary | ICD-10-CM | POA: Diagnosis not present

## 2020-02-13 DIAGNOSIS — D2262 Melanocytic nevi of left upper limb, including shoulder: Secondary | ICD-10-CM | POA: Diagnosis not present

## 2020-02-13 DIAGNOSIS — D2261 Melanocytic nevi of right upper limb, including shoulder: Secondary | ICD-10-CM | POA: Diagnosis not present

## 2020-02-13 DIAGNOSIS — L570 Actinic keratosis: Secondary | ICD-10-CM | POA: Diagnosis not present

## 2020-02-13 DIAGNOSIS — Z85828 Personal history of other malignant neoplasm of skin: Secondary | ICD-10-CM | POA: Diagnosis not present

## 2020-04-08 ENCOUNTER — Ambulatory Visit (INDEPENDENT_AMBULATORY_CARE_PROVIDER_SITE_OTHER): Payer: Medicare Other | Admitting: Vascular Surgery

## 2020-04-08 ENCOUNTER — Ambulatory Visit (INDEPENDENT_AMBULATORY_CARE_PROVIDER_SITE_OTHER): Payer: Medicare Other

## 2020-04-08 ENCOUNTER — Encounter (INDEPENDENT_AMBULATORY_CARE_PROVIDER_SITE_OTHER): Payer: Self-pay | Admitting: Vascular Surgery

## 2020-04-08 ENCOUNTER — Other Ambulatory Visit: Payer: Self-pay

## 2020-04-08 VITALS — BP 147/76 | HR 53 | Resp 16 | Wt 181.6 lb

## 2020-04-08 DIAGNOSIS — I10 Essential (primary) hypertension: Secondary | ICD-10-CM | POA: Diagnosis not present

## 2020-04-08 DIAGNOSIS — E7800 Pure hypercholesterolemia, unspecified: Secondary | ICD-10-CM | POA: Diagnosis not present

## 2020-04-08 DIAGNOSIS — I6523 Occlusion and stenosis of bilateral carotid arteries: Secondary | ICD-10-CM

## 2020-04-10 ENCOUNTER — Encounter (INDEPENDENT_AMBULATORY_CARE_PROVIDER_SITE_OTHER): Payer: Self-pay | Admitting: Vascular Surgery

## 2020-04-10 NOTE — Progress Notes (Signed)
MRN : [id number]  Nicole Tucker is a 74 y.o. (05-30-1945) female who presents with chief complaint of  Chief Complaint  Patient presents with  . Follow-up    58month ultrasound follow up  .  History of Present Illness:   The patient is seen for follow up evaluation of carotid stenosis. The carotid stenosis followed by ultrasound.   The patient denies amaurosis fugax. There is no recent history of TIA symptoms or focal motor deficits. There is no prior documented CVA.  The patient is taking enteric-coated aspirin 81 mg daily.  There is no history of migraine headaches. There is no history of seizures.  The patient has a history of coronary artery disease, no recent episodes of angina or shortness of breath. The patient denies PAD or claudication symptoms. There is a history of hyperlipidemia which is being treated with a statin.    Carotid Duplex done today shows RICA 67-89% and LICA <38% (high grade left external stenosis).  No change compared to last study in 6 months ago  Current Meds  Medication Sig  . Ascorbic Acid (VITAMIN C PO) Take by mouth.  . B Complex Vitamins (VITAMIN B COMPLEX PO) Take by mouth.  . Cholecalciferol (VITAMIN D) 2000 units CAPS Take by mouth daily.   . Coenzyme Q10 (CO Q10) 200 MG CAPS Take by mouth daily.   . fluorouracil (EFUDEX) 5 % cream APPLY TO NOSE AND LIP TWICE DAILY FOR FOUR WEEKS  . Ginkgo Biloba 40 MG TABS Take 120 mg by mouth daily.   Haydee Salter Leaf POWD by Does not apply route.  . hydrochlorothiazide (HYDRODIURIL) 25 MG tablet Take 1 tablet (25 mg total) by mouth daily.  Marland Kitchen lisinopril (ZESTRIL) 20 MG tablet Take 1.5 tablets (30 mg total) by mouth daily.  . milk thistle 175 MG tablet Take 175 mg by mouth daily.   . Multiple Vitamin (MULTIVITAMIN WITH MINERALS) TABS tablet Take 1 tablet by mouth daily.  . Multiple Vitamins-Minerals (ZINC PO) Take by mouth.  . omega-3 acid ethyl esters (LOVAZA) 1 g capsule Take 1 g by mouth daily.   . TURMERIC PO Take by mouth 2 (two) times daily.   Marland Kitchen VITAMIN A PO Take by mouth.  Marland Kitchen VITAMIN E PO Take 400 Units by mouth daily.     Past Medical History:  Diagnosis Date  . Arthritis   . Diabetes mellitus without complication ([place])    New DX - no meds yet  . Diverticular disease   . Hypertension     Past Surgical History:  Procedure Laterality Date  . CATARACT EXTRACTION    . COLONOSCOPY N/A 10/07/2015   Procedure: COLONOSCOPY;  Surgeon: Lucilla Lame, MD;  Location: [place];  Service: Endoscopy;  Laterality: N/A;  . DILATION AND CURETTAGE OF UTERUS    . LAPAROSCOPIC SIGMOID COLECTOMY N/A 11/15/2015   Procedure: LAPAROSCOPIC SIGMOID COLECTOMY possible open, possible colostomy;  Surgeon: Jules Husbands, MD;  Location: [facility];  Service: General;  Laterality: N/A;  Please have Hand port ( gelport green by [employer]) ready  . TUBAL LIGATION      Social History Social History   Tobacco Use  . Smoking status: Former Smoker    Types: Cigarettes    Quit date: 11/29/1990    Years since quitting: 29.3  . Smokeless tobacco: Never Used  . Tobacco comment: quit 1992  Vaping Use  . Vaping Use: Never used  Substance Use Topics  . Alcohol use: Yes  Alcohol/week: 1.0 - 2.0 standard drink    Types: 1 - 2 Glasses of wine per week  . Drug use: No    Family History Family History  Problem Relation Age of Onset  . Heart disease Mother   . Hypertension Mother   . Aneurysm Mother   . Alcohol abuse Father   . Bladder Cancer Sister   . Hypertension Sister   . Breast cancer Sister 31  . Hypertension Sister   . Diverticulitis Brother   . Hypertension Brother   . Hypertension Sister   . Diabetes Paternal Grandmother   . Heart disease Paternal Grandmother     Allergies  Allergen Reactions  . Codeine Itching and Anxiety    Other reaction(s): Other (See Comments) Nervous     REVIEW OF SYSTEMS (Negative unless checked)  Constitutional: [] Weight loss  [] Fever   [] Chills Cardiac: [] Chest pain   [] Chest pressure   [] Palpitations   [] Shortness of breath when laying flat   [] Shortness of breath with exertion. Vascular:  [] Pain in legs with walking   [] Pain in legs at rest  [] History of DVT   [] Phlebitis   [] Swelling in legs   [] Varicose veins   [] Non-healing ulcers Pulmonary:   [] Uses home oxygen   [] Productive cough   [] Hemoptysis   [] Wheeze  [] COPD   [] Asthma Neurologic:  [] Dizziness   [] Seizures   [] History of stroke   [] History of TIA  [] Aphasia   [] Vissual changes   [] Weakness or numbness in arm   [] Weakness or numbness in leg Musculoskeletal:   [] Joint swelling   [] Joint pain   [] Low back pain Hematologic:  [] Easy bruising  [] Easy bleeding   [] Hypercoagulable state   [] Anemic Gastrointestinal:  [] Diarrhea   [] Vomiting  [] Gastroesophageal reflux/heartburn   [] Difficulty swallowing. Genitourinary:  [] Chronic kidney disease   [] Difficult urination  [] Frequent urination   [] Blood in urine Skin:  [] Rashes   [] Ulcers  Psychological:  [] History of anxiety   []  History of major depression.  Physical Examination  Vitals:   04/08/20 1023  BP: (!) 147/76  Pulse: (!) 53  Resp: 16  Weight: 181 lb 9.6 oz (82.4 kg)   Body mass index is 32.17 kg/m. Gen: WD/WN, NAD Head: [place]/AT, No temporalis wasting.  Ear/Nose/Throat: Hearing grossly intact, nares w/o erythema or drainage Eyes: PER, EOMI, sclera nonicteric.  Neck: Supple, no large masses.   Pulmonary:  Good air movement, no audible wheezing bilaterally, no use of accessory muscles.  Cardiac: RRR, no JVD Vascular:  Vessel Right Left  Radial Palpable Palpable  Brachial Palpable Palpable  Carotid Palpable Palpable  Gastrointestinal: Non-distended. No guarding/no peritoneal signs.  Musculoskeletal: M/S 5/5 throughout.  No deformity or atrophy.  Neurologic: CN 2-12 intact. Symmetrical.  Speech is fluent. Motor exam as listed above. Psychiatric: Judgment intact, Mood & affect appropriate for pt's clinical  situation. Dermatologic: No rashes or ulcers noted.  No changes consistent with cellulitis.   CBC Lab Results  Component Value Date   WBC 7.2 04/10/2019   HGB 15.5 04/10/2019   HCT 45.9 04/10/2019   MCV 96 04/10/2019   PLT 250 04/10/2019    BMET    Component Value Date/Time   NA 139 04/10/2019 1212   NA 140 12/26/2011 1920   K 4.3 04/10/2019 1212   K 3.6 12/26/2011 1920   CL 99 04/10/2019 1212   CL 103 12/26/2011 1920   CO2 26 04/10/2019 1212   CO2 26 12/26/2011 1920   GLUCOSE 106 (H) 04/10/2019 1212  GLUCOSE 216 (H) 11/16/2015 0532   GLUCOSE 90 12/26/2011 1920   BUN 12 04/10/2019 1212   BUN 19 (H) 12/26/2011 1920   CREATININE 0.81 04/10/2019 1212   CREATININE 0.76 12/26/2011 1920   CALCIUM 9.7 04/10/2019 1212   CALCIUM 9.2 12/26/2011 1920   GFRNONAA 73 04/10/2019 1212   GFRNONAA >60 12/26/2011 1920   GFRAA 85 04/10/2019 1212   GFRAA >60 12/26/2011 1920   CrCl cannot be calculated (Patient's most recent lab result is older than the maximum 21 days allowed.).  COAG Lab Results  Component Value Date   INR 1.11 08/31/2015   INR 1.00 08/30/2015    Radiology VAS US CAROTID  Result Date: 04/08/2020 Carotid Arterial Duplex Study Indications:       Carotid stenosis. Comparison Study:  10/09/2019 Performing Technologist: Charlane Ferretti RT (R)(VS)  Examination Guidelines: A complete evaluation includes B-mode imaging, spectral Doppler, color Doppler, and power Doppler as needed of all accessible portions of each vessel. Bilateral testing is considered an integral part of a complete examination. Limited examinations for reoccurring indications may be performed as noted.  Right Carotid Findings: +----------+--------+--------+--------+------------------+-------------------+           PSV cm/sEDV cm/sStenosisPlaque DescriptionComments            +----------+--------+--------+--------+------------------+-------------------+ CCA Prox  54      15                                 intimal thickening  +----------+--------+--------+--------+------------------+-------------------+ CCA Mid   45      14                                intimal thickening  +----------+--------+--------+--------+------------------+-------------------+ CCA Distal61      21              calcific          intimal thickening  +----------+--------+--------+--------+------------------+-------------------+ ICA Prox  140     48                                ICA/CCA ratio = 3.0 +----------+--------+--------+--------+------------------+-------------------+ ICA Mid   122     37                                                    +----------+--------+--------+--------+------------------+-------------------+ ICA Distal143     46                                                    +----------+--------+--------+--------+------------------+-------------------+ ECA       67      14                                                    +----------+--------+--------+--------+------------------+-------------------+ +----------+--------+-------+----------------+-------------------+           PSV cm/sEDV cmsDescribe  Arm Pressure (mmHG) +----------+--------+-------+----------------+-------------------+ Subclavian109            Multiphasic, WNL                    +----------+--------+-------+----------------+-------------------+ +---------+--------+--+--------+--+---------+ VertebralPSV cm/s63EDV cm/s13Antegrade +---------+--------+--+--------+--+---------+ Left Carotid Findings: +----------+--------+--------+--------+------------------+--------------------+           PSV cm/sEDV cm/sStenosisPlaque DescriptionComments             +----------+--------+--------+--------+------------------+--------------------+ CCA Prox  81      20                                                      +----------+--------+--------+--------+------------------+--------------------+ CCA Mid   77      19                                                     +----------+--------+--------+--------+------------------+--------------------+ CCA Distal68      19              calcific                               +----------+--------+--------+--------+------------------+--------------------+ ICA Prox  82      22              calcific          ICA/CCA ratio = 1.13 +----------+--------+--------+--------+------------------+--------------------+ ICA Mid   87      27                                                     +----------+--------+--------+--------+------------------+--------------------+ ICA Distal80      25                                                     +----------+--------+--------+--------+------------------+--------------------+ ECA       375     60              calcific                               +----------+--------+--------+--------+------------------+--------------------+ +----------+--------+--------+----------+-------------------+           PSV cm/sEDV cm/sDescribe  Arm Pressure (mmHG) +----------+--------+--------+----------+-------------------+ Subclavian117             Muliphasic                    +----------+--------+--------+----------+-------------------+ +---------+--------+--+--------+--+---------+ VertebralPSV cm/s54EDV cm/s14Antegrade +---------+--------+--+--------+--+---------+ Summary: Right Carotid: Velocities in the right ICA are consistent with a 40-59%                stenosis. Left Carotid: Velocities in the left ICA are consistent with a 1-39% stenosis.               The ECA appears >50% stenosed. Vertebrals:  Bilateral vertebral arteries  demonstrate antegrade flow. Subclavians: Normal flow hemodynamics were seen in bilateral subclavian              arteries.               Velocities appear slightly decreased as compared to  the previous              exam on 10/09/2019 but appear consisant with the prior exam on 12/5/              2019. Left ECA appears slightly increased as compared to the prior              exam on 10/09/2019. *See table(s) above for measurements and observations.  Electronically signed by Hortencia Pilar MD on 04/08/2020 at 4:57:34 PM.    Final      Assessment/Plan 1. Bilateral carotid artery stenosis Recommend:  Given the patient's asymptomatic subcritical stenosis no further invasive testing or surgery at this time.  Carotid Duplex done today shows RICA 85-63% and LICA <14% (high grade left external stenosis).  No change compared to last study in 6 months ago  Continue antiplatelet therapy as prescribed Continue management of CAD, HTN and Hyperlipidemia Healthy heart diet,  encouraged exercise at least 4 times per week Follow up in 12 months with duplex ultrasound and physical exam  - VAS US CAROTID; Future  2. Benign essential HTN Continue antihypertensive medications as already ordered, these medications have been reviewed and there are no changes at this time.   3. Hypercholesterolemia without hypertriglyceridemia Continue statin as ordered and reviewed, no changes at this time    Hortencia Pilar, MD  04/10/2020 8:41 AM

## 2020-04-12 ENCOUNTER — Encounter: Payer: Self-pay | Admitting: Physician Assistant

## 2020-04-27 ENCOUNTER — Encounter: Payer: Self-pay | Admitting: Physician Assistant

## 2020-04-27 DIAGNOSIS — I10 Essential (primary) hypertension: Secondary | ICD-10-CM

## 2020-04-29 MED ORDER — LISINOPRIL 20 MG PO TABS: 30.0000 mg | ORAL_TABLET | Freq: Every day | ORAL | 3 refills | Status: DC

## 2020-04-29 MED ORDER — HYDROCHLOROTHIAZIDE 25 MG PO TABS: 25.0000 mg | ORAL_TABLET | Freq: Every day | ORAL | 3 refills | Status: DC

## 2020-05-07 ENCOUNTER — Encounter: Payer: Self-pay | Admitting: Physician Assistant

## 2020-05-07 NOTE — Telephone Encounter (Signed)
She needs an appt, virtual or in person are ok, patient preference.

## 2020-06-24 NOTE — Progress Notes (Unsigned)
Subjective:   Nicole Tucker is a 75 y.o. female who presents for Medicare Annual (Subsequent) preventive examination.  I connected with [name] today by telephone and verified that I am speaking with the correct person using two identifiers. Location patient: home Location provider: work Persons participating in the virtual visit: patient, provider.   I discussed the limitations, risks, security and privacy concerns of performing an evaluation and management service by telephone and the availability of in person appointments. I also discussed with the patient that there may be a patient responsible charge related to this service. The patient expressed understanding and verbally consented to this telephonic visit.    Interactive audio and video telecommunications were attempted between this provider and patient, however failed, due to patient having technical difficulties OR patient did not have access to video capability.  We continued and completed visit with audio only.   Review of Systems    N/A  Cardiac Risk Factors include: advanced age (>47men, >11 women);hypertension     Objective:    There were no vitals filed for this visit. There is no height or weight on file to calculate BMI.  Advanced Directives 06/25/2020 03/06/2019 11/02/2017 12/07/2016 11/19/2016 10/27/2016 11/15/2015  Does Patient Have a Medical Advance Directive? No No No No No No No  Would patient like information on creating a medical advance directive? No - Patient declined No - Patient declined Yes (MAU/Ambulatory/Procedural Areas - Information given) - - No - Patient declined Yes - Scientist, clinical (histocompatibility and immunogenetics) given;No - patient declined information    Current Medications (verified) Outpatient Encounter Medications as of 06/25/2020  Medication Sig  . Ascorbic Acid (VITAMIN C PO) Take by mouth daily.  . B Complex Vitamins (VITAMIN B COMPLEX PO) Take by mouth daily.  . Cholecalciferol (VITAMIN D) 2000 units CAPS Take by  mouth daily.   . Ginkgo Biloba 40 MG TABS Take 120 mg by mouth daily.   . hydrochlorothiazide (HYDRODIURIL) 25 MG tablet Take 1 tablet (25 mg total) by mouth daily.  Marland Kitchen lisinopril (ZESTRIL) 20 MG tablet Take 1.5 tablets (30 mg total) by mouth daily.  . Multiple Vitamin (MULTIVITAMIN WITH MINERALS) TABS tablet Take 1 tablet by mouth daily.  Marland Kitchen omega-3 acid ethyl esters (LOVAZA) 1 g capsule Take 1 g by mouth daily.  . TURMERIC PO Take by mouth daily at 6 (six) AM.  . VITAMIN A PO Take by mouth daily.  Marland Kitchen VITAMIN E PO Take 400 Units by mouth daily.   Marland Kitchen zinc gluconate 50 MG tablet Take 50 mg by mouth daily.  . Coenzyme Q10 (CO Q10) 200 MG CAPS Take by mouth daily.  (Patient not taking: Reported on 06/25/2020)  . fluorouracil (EFUDEX) 5 % cream APPLY TO NOSE AND LIP TWICE DAILY FOR FOUR WEEKS (Patient not taking: Reported on 06/25/2020)  . Gymnema Sylvestris Leaf POWD by Does not apply route. (Patient not taking: Reported on 06/25/2020)  . methylPREDNISolone (MEDROL) 4 MG TBPK tablet 6 day taper; take as directed on instructions (Patient not taking: No sig reported)  . milk thistle 175 MG tablet Take 175 mg by mouth daily.  (Patient not taking: Reported on 06/25/2020)  . Multiple Vitamins-Minerals (ZINC PO) Take by mouth. (Patient not taking: Reported on 06/25/2020)   No facility-administered encounter medications on file as of 06/25/2020.    Allergies (verified) Codeine   History: Past Medical History:  Diagnosis Date  . Arthritis   . Diabetes mellitus without complication (HCC)    New DX - no  meds yet  . Diverticular disease   . Hypertension    Past Surgical History:  Procedure Laterality Date  . CATARACT EXTRACTION    . COLONOSCOPY N/A 10/07/2015   Procedure: COLONOSCOPY;  Surgeon: Lucilla Lame, MD;  Location: [facility];  Service: Endoscopy;  Laterality: N/A;  . DILATION AND CURETTAGE OF UTERUS    . LAPAROSCOPIC SIGMOID COLECTOMY N/A 11/15/2015   Procedure: LAPAROSCOPIC SIGMOID COLECTOMY  possible open, possible colostomy;  Surgeon: Jules Husbands, MD;  Location: [facility];  Service: General;  Laterality: N/A;  Please have Hand port ( gelport green by [facility]) ready  . TUBAL LIGATION     Family History  Problem Relation Age of Onset  . Heart disease Mother   . Hypertension Mother   . Aneurysm Mother   . Alcohol abuse Father   . Bladder Cancer Sister   . Hypertension Sister   . Breast cancer Sister 44  . Hypertension Sister   . Diverticulitis Brother   . Hypertension Brother   . Hypertension Sister   . Diabetes Paternal Grandmother   . Heart disease Paternal Grandmother    Social History   Socioeconomic History  . Marital status: Divorced    Spouse name: Not on file  . Number of children: 1  . Years of education: Not on file  . Highest education level: Bachelor's degree (e.g., BA, AB, BS)  Occupational History  . Occupation: retired  Tobacco Use  . Smoking status: Former Smoker    Types: Cigarettes    Quit date: 11/29/1990    Years since quitting: 29.5  . Smokeless tobacco: Never Used  . Tobacco comment: quit 1992  Vaping Use  . Vaping Use: Never used  Substance and Sexual Activity  . Alcohol use: Yes    Alcohol/week: 1.0 - 2.0 standard drink    Types: 1 - 2 Glasses of wine per week  . Drug use: No  . Sexual activity: Not on file  Other Topics Concern  . Not on file  Social History Narrative  . Not on file   Social Determinants of Health   Financial Resource Strain: Low Risk   . Difficulty of Paying Living Expenses: Not hard at all  Food Insecurity: No Food Insecurity  . Worried About Charity fundraiser in the Last Year: Never true  . Ran Out of Food in the Last Year: Never true  Transportation Needs: No Transportation Needs  . Lack of Transportation (Medical): No  . Lack of Transportation (Non-Medical): No  Physical Activity: Inactive  . Days of Exercise per Week: 0 days  . Minutes of Exercise per Session: 0 min  Stress: No Stress  Concern Present  . Feeling of Stress : Not at all  Social Connections: Moderately Isolated  . Frequency of Communication with Friends and Family: Once a week  . Frequency of Social Gatherings with Friends and Family: More than three times a week  . Attends Religious Services: Never  . Active Member of Clubs or Organizations: Yes  . Attends Archivist Meetings: More than 4 times per year  . Marital Status: Divorced    Tobacco Counseling Counseling given: Not Answered Comment: quit 1992   Clinical Intake:  Pre-visit preparation completed: Yes  Pain : No/denies pain     Nutritional Risks: None Diabetes: No  How often do you need to have someone help you when you read instructions, pamphlets, or other written materials from your doctor or pharmacy?: 1 - Never  Diabetic? Borderline  Interpreter Needed?: No  Information entered by :: [facility], LPN   Activities of Daily Living In your present state of health, do you have any difficulty performing the following activities: 06/25/2020  Hearing? N  Vision? N  Difficulty concentrating or making decisions? N  Walking or climbing stairs? N  Dressing or bathing? N  Doing errands, shopping? N  Preparing Food and eating ? N  Using the Toilet? N  In the past six months, have you accidently leaked urine? N  Do you have problems with loss of bowel control? N  Managing your Medications? N  Managing your Finances? N  Housekeeping or managing your Housekeeping? N  Some recent data might be hidden    Patient Care Team: Mar Daring, PA-C as PCP - General (Family Medicine) Delana Meyer, Dolores Lory, MD as Consulting Physician (Vascular Surgery) Oneta Rack, MD as Consulting Physician (Dermatology) Pa, [place] as Consulting Physician  Indicate any recent [place] you may have received from other than [facility] providers in the past year (date may be approximate).     Assessment:   This is a  routine wellness examination for Alnisa.  Hearing/Vision screen No exam data present  Dietary issues and exercise activities discussed: Current Exercise Habits: The patient does not participate in regular exercise at present, Exercise limited by: None identified  Goals    . Exercise 3x per week (30 min per time)     Recommend increasing exercise to walking 3 days a week for 30 minutes.      Depression Screen PHQ 2/9 Scores 06/25/2020 04/10/2019 03/06/2019 11/02/2017 10/27/2016 10/27/2016 09/10/2015  PHQ - 2 Score 0 0 0 0 0 0 1  PHQ- 9 Score - - - - 4 - -    Fall Risk Fall Risk  06/25/2020 04/10/2019 03/06/2019 11/02/2017 10/27/2016  Falls in the past year? 0 0 0 No No  Number falls in past yr: 0 0 0 - -  Injury with Fall? 0 0 0 - -  Follow up - Falls evaluation completed - - -    FALL RISK PREVENTION PERTAINING TO THE HOME:  Any stairs in or around the home? Yes  If so, are there any without handrails? Yes Home free of loose throw rugs in walkways, pet beds, electrical cords, etc? Yes  Adequate lighting in your home to reduce risk of falls? Yes   ASSISTIVE DEVICES UTILIZED TO PREVENT FALLS:  Life alert? No  Use of a cane, walker or w/c? No  Grab bars in the bathroom? Yes  Shower chair or bench in shower? No  Elevated toilet seat or a handicapped toilet? No    Cognitive Function: Normal cognitive status assessed by observation by this Nurse Health Advisor. No abnormalities found.       6CIT Screen 10/27/2016  What Year? 0 points  What month? 0 points  What time? 0 points  Count back from 20 0 points  Months in reverse 0 points  Repeat phrase 4 points  Total Score 4    Immunizations Immunization History  Administered Date(s) Administered  . Pneumococcal Conjugate-13 09/10/2015  . Pneumococcal Polysaccharide-23 10/27/2016  . Tdap 10/14/2009    TDAP status: Due, Education has been provided regarding the importance of this vaccine. Advised may receive this vaccine at local  pharmacy or Health Dept. Aware to provide a copy of the vaccination record if obtained from local pharmacy or Health Dept. Verbalized acceptance and understanding.  Flu Vaccine status:  Declined, Education has been provided regarding the importance of this vaccine but patient still declined. Advised may receive this vaccine at local pharmacy or Health Dept. Aware to provide a copy of the vaccination record if obtained from local pharmacy or Health Dept. Verbalized acceptance and understanding.  Pneumococcal vaccine status: Up to date  Covid-19 vaccine status: Declined, Education has been provided regarding the importance of this vaccine but patient still declined. Advised may receive this vaccine at local pharmacy or Health Dept.or vaccine clinic. Aware to provide a copy of the vaccination record if obtained from local pharmacy or Health Dept. Verbalized acceptance and understanding.  Qualifies for Shingles Vaccine? Yes   Zostavax completed No   Shingrix Completed?: No.    Education has been provided regarding the importance of this vaccine. Patient has been advised to call insurance company to determine out of pocket expense if they have not yet received this vaccine. Advised may also receive vaccine at local pharmacy or Health Dept. Verbalized acceptance and understanding.  Screening Tests Health Maintenance  Topic Date Due  . MAMMOGRAM  04/09/2020  . COVID-19 Vaccine (1) 07/11/2020 (Originally 03/10/1951)  . INFLUENZA VACCINE  08/22/2020 (Originally 12/24/2019)  . TETANUS/TDAP  06/25/2021 (Originally 10/15/2019)  . DEXA SCAN  10/14/2020  . COLONOSCOPY (Pts 45-72yrs Insurance coverage will need to be confirmed)  10/06/2025  . Hepatitis C Screening  Completed  . PNA vac Low Risk Adult  Completed    Health Maintenance  Health Maintenance Due  Topic Date Due  . MAMMOGRAM  04/09/2020    Colorectal cancer screening: Type of screening: Colonoscopy. Completed 10/07/15. Repeat every 10  years  Mammogram status: Ordered today. Pt provided with contact info and advised to call to schedule appt.   Bone Density status: Completed 10/15/15. Results reflect: Bone density results: OSTEOPENIA. Repeat every 5 years.  Lung Cancer Screening: (Low Dose CT Chest recommended if Age 46-80 years, 30 pack-year currently smoking OR have quit w/in 15years.) does not qualify.    Additional Screening:  Hepatitis C Screening: Up to date  Vision Screening: Recommended annual ophthalmology exams for early detection of glaucoma and other disorders of the eye. Is the patient up to date with their annual eye exam?  Yes  Who is the provider or what is the name of the office in which the patient attends annual eye exams? [facility] If pt is not established with a provider, would they like to be referred to a provider to establish care? No .   Dental Screening: Recommended annual dental exams for proper oral hygiene  Community Resource Referral / Chronic Care Management: CRR required this visit?  No   CCM required this visit?  No      Plan:     I have personally reviewed and noted the following in the patient's chart:   . Medical and social history . Use of alcohol, tobacco or illicit drugs  . Current medications and supplements . Functional ability and status . Nutritional status . Physical activity . Advanced directives . List of other physicians . Hospitalizations, surgeries, and ER visits in previous 12 months . Vitals . Screenings to include cognitive, depression, and falls . Referrals and appointments  In addition, I have reviewed and discussed with patient certain preventive protocols, quality metrics, and best practice recommendations. A written personalized care plan for preventive services as well as general preventive health recommendations were provided to patient.     Jahki Witham [place], [place]   [id number]   Nurse  Notes: Pt declined receiving a future Covid or  flu vaccine.

## 2020-06-25 ENCOUNTER — Ambulatory Visit (INDEPENDENT_AMBULATORY_CARE_PROVIDER_SITE_OTHER): Payer: Medicare Other

## 2020-06-25 ENCOUNTER — Other Ambulatory Visit: Payer: Self-pay

## 2020-06-25 DIAGNOSIS — Z0000 Encounter for general adult medical examination without abnormal findings: Secondary | ICD-10-CM

## 2020-06-25 DIAGNOSIS — Z1231 Encounter for screening mammogram for malignant neoplasm of breast: Secondary | ICD-10-CM

## 2020-06-25 NOTE — Patient Instructions (Signed)
Nicole Tucker , Thank you for taking time to come for your Medicare Wellness Visit. I appreciate your ongoing commitment to your health goals. Please review the following plan we discussed and let me know if I can assist you in the future.   Screening recommendations/referrals: Colonoscopy: Up to date, due 09/2025 Mammogram: Currently due. Ordered today. Please call [phone] to schedule your mammogram.  Bone Density: Up to date, due 09/2020 Recommended yearly ophthalmology/optometry visit for glaucoma screening and checkup Recommended yearly dental visit for hygiene and checkup  Vaccinations: Influenza vaccine: Currently due, declined receiving. Pneumococcal vaccine: Completed series Tdap vaccine: Currently due, declined receiving.  Shingles vaccine: Shingrix discussed. Please contact your pharmacy for coverage information.     Advanced directives: Advance directive discussed with you today. Even though you declined this today please call our office should you change your mind and we can give you the proper paperwork for you to fill out.  Conditions/risks identified: Recommend to start exercising 3 days a week for 30 minutes at a time.  Next appointment: 09/03/19 @ 10:00 AM with Saxon Years, Female Preventive care refers to lifestyle choices and visits with your health care provider that can promote health and wellness. What does preventive care include?  A yearly physical exam. This is also called an annual well check.  Dental exams once or twice a year.  Routine eye exams. Ask your health care provider how often you should have your eyes checked.  Personal lifestyle choices, including:  Daily care of your teeth and gums.  Regular physical activity.  Eating a healthy diet.  Avoiding tobacco and drug use.  Limiting alcohol use.  Practicing safe sex.  Taking low-dose aspirin daily starting at age 50.  Taking vitamin and mineral  supplements as recommended by your health care provider. What happens during an annual well check? The services and screenings done by your health care provider during your annual well check will depend on your age, overall health, lifestyle risk factors, and family history of disease. Counseling  Your health care provider may ask you questions about your:  Alcohol use.  Tobacco use.  Drug use.  Emotional well-being.  Home and relationship well-being.  Sexual activity.  Eating habits.  Work and work Statistician.  Method of birth control.  Menstrual cycle.  Pregnancy history. Screening  You may have the following tests or measurements:  Height, weight, and BMI.  Blood pressure.  Lipid and cholesterol levels. These may be checked every 5 years, or more frequently if you are over 80 years old.  Skin check.  Lung cancer screening. You may have this screening every year starting at age 78 if you have a 30-pack-year history of smoking and currently smoke or have quit within the past 15 years.  Fecal occult blood test (FOBT) of the stool. You may have this test every year starting at age 57.  Flexible sigmoidoscopy or colonoscopy. You may have a sigmoidoscopy every 5 years or a colonoscopy every 10 years starting at age 65.  Hepatitis C blood test.  Hepatitis B blood test.  Sexually transmitted disease (STD) testing.  Diabetes screening. This is done by checking your blood sugar (glucose) after you have not eaten for a while (fasting). You may have this done every 1-3 years.  Mammogram. This may be done every 1-2 years. Talk to your health care provider about when you should start having regular mammograms. This may depend on whether you have a family  history of breast cancer.  BRCA-related cancer screening. This may be done if you have a family history of breast, ovarian, tubal, or peritoneal cancers.  Pelvic exam and Pap test. This may be done every 3 years starting  at age 51. Starting at age 75, this may be done every 5 years if you have a Pap test in combination with an HPV test.  Bone density scan. This is done to screen for osteoporosis. You may have this scan if you are at high risk for osteoporosis. Discuss your test results, treatment options, and if necessary, the need for more tests with your health care provider. Vaccines  Your health care provider may recommend certain vaccines, such as:  Influenza vaccine. This is recommended every year.  Tetanus, diphtheria, and acellular pertussis (Tdap, Td) vaccine. You may need a Td booster every 10 years.  Zoster vaccine. You may need this after age 37.  Pneumococcal 13-valent conjugate (PCV13) vaccine. You may need this if you have certain conditions and were not previously vaccinated.  Pneumococcal polysaccharide (PPSV23) vaccine. You may need one or two doses if you smoke cigarettes or if you have certain conditions. Talk to your health care provider about which screenings and vaccines you need and how often you need them. This information is not intended to replace advice given to you by your health care provider. Make sure you discuss any questions you have with your health care provider. Document Released: 06/07/2015 Document Revised: 01/29/2016 Document Reviewed: 03/12/2015 Elsevier Interactive Patient Education  2017 [place] Prevention in the Home Falls can cause injuries. They can happen to people of all ages. There are many things you can do to make your home safe and to help prevent falls. What can I do on the outside of my home?  Regularly fix the edges of walkways and driveways and fix any cracks.  Remove anything that might make you trip as you walk through a door, such as a raised step or threshold.  Trim any bushes or trees on the path to your home.  Use bright outdoor lighting.  Clear any walking paths of anything that might make someone trip, such as rocks or  tools.  Regularly check to see if handrails are loose or broken. Make sure that both sides of any steps have handrails.  Any raised decks and porches should have guardrails on the edges.  Have any leaves, snow, or ice cleared regularly.  Use sand or salt on walking paths during winter.  Clean up any spills in your garage right away. This includes oil or grease spills. What can I do in the bathroom?  Use night lights.  Install grab bars by the toilet and in the tub and shower. Do not use towel bars as grab bars.  Use non-skid mats or decals in the tub or shower.  If you need to sit down in the shower, use a plastic, non-slip stool.  Keep the floor dry. Clean up any water that spills on the floor as soon as it happens.  Remove soap buildup in the tub or shower regularly.  Attach bath mats securely with double-sided non-slip rug tape.  Do not have throw rugs and other things on the floor that can make you trip. What can I do in the bedroom?  Use night lights.  Make sure that you have a light by your bed that is easy to reach.  Do not use any sheets or blankets that are too  big for your bed. They should not hang down onto the floor.  Have a firm chair that has side arms. You can use this for support while you get dressed.  Do not have throw rugs and other things on the floor that can make you trip. What can I do in the kitchen?  Clean up any spills right away.  Avoid walking on wet floors.  Keep items that you use a lot in easy-to-reach places.  If you need to reach something above you, use a strong step stool that has a grab bar.  Keep electrical cords out of the way.  Do not use floor polish or wax that makes floors slippery. If you must use wax, use non-skid floor wax.  Do not have throw rugs and other things on the floor that can make you trip. What can I do with my stairs?  Do not leave any items on the stairs.  Make sure that there are handrails on both  sides of the stairs and use them. Fix handrails that are broken or loose. Make sure that handrails are as long as the stairways.  Check any carpeting to make sure that it is firmly attached to the stairs. Fix any carpet that is loose or worn.  Avoid having throw rugs at the top or bottom of the stairs. If you do have throw rugs, attach them to the floor with carpet tape.  Make sure that you have a light switch at the top of the stairs and the bottom of the stairs. If you do not have them, ask someone to add them for you. What else can I do to help prevent falls?  Wear shoes that:  Do not have high heels.  Have rubber bottoms.  Are comfortable and fit you well.  Are closed at the toe. Do not wear sandals.  If you use a stepladder:  Make sure that it is fully opened. Do not climb a closed stepladder.  Make sure that both sides of the stepladder are locked into place.  Ask someone to hold it for you, if possible.  Clearly mark and make sure that you can see:  Any grab bars or handrails.  First and last steps.  Where the edge of each step is.  Use tools that help you move around (mobility aids) if they are needed. These include:  Canes.  Walkers.  Scooters.  Crutches.  Turn on the lights when you go into a dark area. Replace any light bulbs as soon as they burn out.  Set up your furniture so you have a clear path. Avoid moving your furniture around.  If any of your floors are uneven, fix them.  If there are any pets around you, be aware of where they are.  Review your medicines with your doctor. Some medicines can make you feel dizzy. This can increase your chance of falling. Ask your doctor what other things that you can do to help prevent falls. This information is not intended to replace advice given to you by your health care provider. Make sure you discuss any questions you have with your health care provider. Document Released: 03/07/2009 Document Revised:  10/17/2015 Document Reviewed: 06/15/2014 Elsevier Interactive Patient Education  2017 [employer].

## 2020-07-19 ENCOUNTER — Ambulatory Visit: Payer: Self-pay | Admitting: *Deleted

## 2020-07-19 ENCOUNTER — Ambulatory Visit (INDEPENDENT_AMBULATORY_CARE_PROVIDER_SITE_OTHER): Payer: Medicare Other | Admitting: Physician Assistant

## 2020-07-19 ENCOUNTER — Encounter: Payer: Self-pay | Admitting: Physician Assistant

## 2020-07-19 ENCOUNTER — Other Ambulatory Visit: Payer: Self-pay

## 2020-07-19 VITALS — BP 135/59 | HR 66 | Temp 98.0°F | Resp 16 | Wt 182.0 lb

## 2020-07-19 DIAGNOSIS — E119 Type 2 diabetes mellitus without complications: Secondary | ICD-10-CM | POA: Diagnosis not present

## 2020-07-19 DIAGNOSIS — R7303 Prediabetes: Secondary | ICD-10-CM | POA: Diagnosis not present

## 2020-07-19 LAB — POCT GLYCOSYLATED HEMOGLOBIN (HGB A1C): Hemoglobin A1C: 6.6 % — AB (ref 4.0–5.6)

## 2020-07-19 MED ORDER — METFORMIN HCL 500 MG PO TABS: 500.0000 mg | ORAL_TABLET | Freq: Every day | ORAL | 1 refills | Status: DC

## 2020-07-19 NOTE — Patient Instructions (Addendum)
Diabetes Mellitus and Nutrition, Adult When you have diabetes, or diabetes mellitus, it is very important to have healthy eating habits because your blood sugar (glucose) levels are greatly affected by what you eat and drink. Eating healthy foods in the right amounts, at about the same times every day, can help you:  Control your blood glucose.  Lower your risk of heart disease.  Improve your blood pressure.  Reach or maintain a healthy weight. What can affect my meal plan? Every person with diabetes is different, and each person has different needs for a meal plan. Your health care provider may recommend that you work with a dietitian to make a meal plan that is best for you. Your meal plan may vary depending on factors such as:  The calories you need.  The medicines you take.  Your weight.  Your blood glucose, blood pressure, and cholesterol levels.  Your activity level.  Other health conditions you have, such as heart or kidney disease. How do carbohydrates affect me? Carbohydrates, also called carbs, affect your blood glucose level more than any other type of food. Eating carbs naturally raises the amount of glucose in your blood. Carb counting is a method for keeping track of how many carbs you eat. Counting carbs is important to keep your blood glucose at a healthy level, especially if you use insulin or take certain oral diabetes medicines. It is important to know how many carbs you can safely have in each meal. This is different for every person. Your dietitian can help you calculate how many carbs you should have at each meal and for each snack. How does alcohol affect me? Alcohol can cause a sudden decrease in blood glucose (hypoglycemia), especially if you use insulin or take certain oral diabetes medicines. Hypoglycemia can be a life-threatening condition. Symptoms of hypoglycemia, such as sleepiness, dizziness, and confusion, are similar to symptoms of having too much  alcohol.  Do not drink alcohol if: ? Your health care provider tells you not to drink. ? You are pregnant, may be pregnant, or are planning to become pregnant.  If you drink alcohol: ? Do not drink on an empty stomach. ? Limit how much you use to:  0-1 drink a day for women.  0-2 drinks a day for men. ? Be aware of how much alcohol is in your drink. In the [place] one drink equals one 12 oz bottle of beer (355 mL), one 5 oz glass of wine (148 mL), or one 1 oz glass of hard liquor (44 mL). ? Keep yourself hydrated with water, diet soda, or unsweetened iced tea.  Keep in mind that regular soda, juice, and other mixers may contain a lot of sugar and must be counted as carbs. What are tips for following this plan? Reading food labels  Start by checking the serving size on the "Nutrition Facts" label of packaged foods and drinks. The amount of calories, carbs, fats, and other nutrients listed on the label is based on one serving of the item. Many items contain more than one serving per package.  Check the total grams (g) of carbs in one serving. You can calculate the number of servings of carbs in one serving by dividing the total carbs by 15. For example, if a food has 30 g of total carbs per serving, it would be equal to 2 servings of carbs.  Check the number of grams (g) of saturated fats and trans fats in one serving. Choose foods that have   a low amount or none of these fats.  Check the number of milligrams (mg) of salt (sodium) in one serving. Most people should limit total sodium intake to less than 2,300 mg per day.  Always check the nutrition information of foods labeled as "low-fat" or "nonfat." These foods may be higher in added sugar or refined carbs and should be avoided.  Talk to your dietitian to identify your daily goals for nutrients listed on the label. Shopping  Avoid buying canned, pre-made, or processed foods. These foods tend to be high in fat, sodium, and added  sugar.  Shop around the outside edge of the grocery store. This is where you will most often find fresh fruits and vegetables, bulk grains, fresh meats, and fresh dairy. Cooking  Use low-heat cooking methods, such as baking, instead of high-heat cooking methods like deep frying.  Cook using healthy oils, such as olive, canola, or sunflower oil.  Avoid cooking with butter, cream, or high-fat meats. Meal planning  Eat meals and snacks regularly, preferably at the same times every day. Avoid going long periods of time without eating.  Eat foods that are high in fiber, such as fresh fruits, vegetables, beans, and whole grains. Talk with your dietitian about how many servings of carbs you can eat at each meal.  Eat 4-6 oz (112-168 g) of lean protein each day, such as lean meat, chicken, fish, eggs, or tofu. One ounce (oz) of lean protein is equal to: ? 1 oz (28 g) of meat, chicken, or fish. ? 1 egg. ?  cup (62 g) of tofu.  Eat some foods each day that contain healthy fats, such as avocado, nuts, seeds, and fish.   What foods should I eat? Fruits Berries. Apples. Oranges. Peaches. Apricots. Plums. Grapes. Mango. Papaya. Pomegranate. Kiwi. Cherries. Vegetables Lettuce. Spinach. Leafy greens, including kale, chard, collard greens, and mustard greens. Beets. Cauliflower. Cabbage. Broccoli. Carrots. Green beans. Tomatoes. Peppers. Onions. Cucumbers. Brussels sprouts. Grains Whole grains, such as whole-wheat or whole-grain bread, crackers, tortillas, cereal, and pasta. Unsweetened oatmeal. Quinoa. Brown or wild rice. Meats and other proteins Seafood. Poultry without skin. Lean cuts of poultry and beef. Tofu. Nuts. Seeds. Dairy Low-fat or fat-free dairy products such as milk, yogurt, and cheese. The items listed above may not be a complete list of foods and beverages you can eat. Contact a dietitian for more information. What foods should I avoid? Fruits Fruits canned with  syrup. Vegetables Canned vegetables. Frozen vegetables with butter or cream sauce. Grains Refined white flour and flour products such as bread, pasta, snack foods, and cereals. Avoid all processed foods. Meats and other proteins Fatty cuts of meat. Poultry with skin. Breaded or fried meats. Processed meat. Avoid saturated fats. Dairy Full-fat yogurt, cheese, or milk. Beverages Sweetened drinks, such as soda or iced tea. The items listed above may not be a complete list of foods and beverages you should avoid. Contact a dietitian for more information. Questions to ask a health care provider  Do I need to meet with a diabetes educator?  Do I need to meet with a dietitian?  What number can I call if I have questions?  When are the best times to check my blood glucose? Where to find more information:  American Diabetes Association: diabetes.org  Academy of Nutrition and Dietetics: www.eatright.org  National Institute of Diabetes and Digestive and Kidney Diseases: www.niddk.nih.gov  Association of Diabetes Care and Education Specialists: www.diabeteseducator.org Summary  It is important to have healthy eating   habits because your blood sugar (glucose) levels are greatly affected by what you eat and drink.  A healthy meal plan will help you control your blood glucose and maintain a healthy lifestyle.  Your health care provider may recommend that you work with a dietitian to make a meal plan that is best for you.  Keep in mind that carbohydrates (carbs) and alcohol have immediate effects on your blood glucose levels. It is important to count carbs and to use alcohol carefully. This information is not intended to replace advice given to you by your health care provider. Make sure you discuss any questions you have with your health care provider. Document Revised: 04/18/2019 Document Reviewed: 04/18/2019 Elsevier Patient Education  2021 [place].  Metformin Tablets What is this  medicine? METFORMIN (met FOR min) is used to treat type 2 diabetes. It helps to control blood sugar. Treatment is combined with diet and exercise. This medicine can be used alone or with other medicines for diabetes. This medicine may be used for other purposes; ask your health care provider or pharmacist if you have questions. COMMON BRAND NAME(S): Glucophage What should I tell my health care provider before I take this medicine? They need to know if you have any of these conditions:  anemia  dehydration  heart disease  frequently drink alcohol-containing beverages  kidney disease  liver disease  polycystic ovary syndrome  serious infection or injury  vomiting  an unusual or allergic reaction to metformin, other medicines, foods, dyes, or preservatives  pregnant or trying to get pregnant  breast-feeding How should I use this medicine? Take this medicine by mouth with a glass of water. Follow the directions on the prescription label. Take this medicine with food. Take your medicine at regular intervals. Do not take your medicine more often than directed. Do not stop taking except on your doctor's advice. Talk to your pediatrician regarding the use of this medicine in children. While this drug may be prescribed for children as young as 48 years of age for selected conditions, precautions do apply. Overdosage: If you think you have taken too much of this medicine contact a poison control center or emergency room at once. NOTE: This medicine is only for you. Do not share this medicine with others. What if I miss a dose? If you miss a dose, take it as soon as you can. If it is almost time for your next dose, take only that dose. Do not take double or extra doses. What may interact with this medicine? Do not take this medicine with any of the following medications:  certain contrast medicines given before X-rays, CT scans, MRI, or other procedures  dofetilide This medicine may  also interact with the following medications:  acetazolamide  alcohol  certain antivirals for HIV or hepatitis  certain medicines for blood pressure, heart disease, irregular heart beat  cimetidine  dichlorphenamide  digoxin  diuretics  female hormones, like estrogens or progestins and birth control pills  glycopyrrolate  isoniazid  lamotrigine  memantine  methazolamide  metoclopramide  midodrine  niacin  phenothiazines like chlorpromazine, mesoridazine, prochlorperazine, thioridazine  phenytoin  ranolazine  steroid medicines like prednisone or cortisone  stimulant medicines for attention disorders, weight loss, or to stay awake  thyroid medicines  topiramate  trospium  vandetanib  zonisamide This list may not describe all possible interactions. Give your health care provider a list of all the medicines, herbs, non-prescription drugs, or dietary supplements you use. Also tell them if you  smoke, drink alcohol, or use illegal drugs. Some items may interact with your medicine. What should I watch for while using this medicine? Visit your doctor or health care professional for regular checks on your progress. A test called the HbA1C (A1C) will be monitored. This is a simple blood test. It measures your blood sugar control over the last 2 to 3 months. You will receive this test every 3 to 6 months. Learn how to check your blood sugar. Learn the symptoms of low and high blood sugar and how to manage them. Always carry a quick-source of sugar with you in case you have symptoms of low blood sugar. Examples include hard sugar candy or glucose tablets. Make sure others know that you can choke if you eat or drink when you develop serious symptoms of low blood sugar, such as seizures or unconsciousness. They must get medical help at once. Tell your doctor or health care professional if you have high blood sugar. You might need to change the dose of your medicine. If  you are sick or exercising more than usual, you might need to change the dose of your medicine. Do not skip meals. Ask your doctor or health care professional if you should avoid alcohol. Many nonprescription cough and cold products contain sugar or alcohol. These can affect blood sugar. This medicine may cause ovulation in premenopausal women who do not have regular monthly periods. This may increase your chances of becoming pregnant. You should not take this medicine if you become pregnant or think you may be pregnant. Talk with your doctor or health care professional about your birth control options while taking this medicine. Contact your doctor or health care professional right away if you think you are pregnant. If you are going to need surgery, a MRI, CT scan, or other procedure, tell your doctor that you are taking this medicine. You may need to stop taking this medicine before the procedure. Wear a medical ID bracelet or chain, and carry a card that describes your disease and details of your medicine and dosage times. This medicine may cause a decrease in folic acid and vitamin B12. You should make sure that you get enough vitamins while you are taking this medicine. Discuss the foods you eat and the vitamins you take with your health care professional. What side effects may I notice from receiving this medicine? Side effects that you should report to your doctor or health care professional as soon as possible:  allergic reactions like skin rash, itching or hives, swelling of the face, lips, or tongue  breathing problems  feeling faint or lightheaded, falls  muscle aches or pains  signs and symptoms of low blood sugar such as feeling anxious, confusion, dizziness, increased hunger, unusually weak or tired, sweating, shakiness, cold, irritable, headache, blurred vision, fast heartbeat, loss of consciousness  slow or irregular heartbeat  unusual stomach pain or discomfort  unusually  tired or weak Side effects that usually do not require medical attention (report to your doctor or health care professional if they continue or are bothersome):  diarrhea  headache  heartburn  metallic taste in mouth  nausea  stomach gas, upset This list may not describe all possible side effects. Call your doctor for medical advice about side effects. You may report side effects to FDA at 1-800-FDA-1088. Where should I keep my medicine? Keep out of the reach of children. Store at room temperature between 15 and 30 degrees C (59 and 86 degrees F). Protect  from moisture and light. Throw away any unused medicine after the expiration date. NOTE: This sheet is a summary. It may not cover all possible information. If you have questions about this medicine, talk to your doctor, pharmacist, or health care provider.  2021 Elsevier/Gold Standard (2020-04-07 10:29:07)

## 2020-07-19 NOTE — Telephone Encounter (Signed)
Patient is calling with concerns that her glucose level is too high- patient states she has always been pre diabetic- but recently she has gained weight, not watching diet and she feels she may be diabatic now. She does have vision changes, some weakness at times. Fasting glucose today 163- does not monitor regularly   Reason for Disposition . New-onset diabetes suspected (e.g., frequent urination, weakness, weight loss)  Answer Assessment - Initial Assessment Questions 1. BLOOD GLUCOSE: "What is your blood glucose level?"      163 2. ONSET: "When did you check the blood glucose?"     Today- 8:30 3. USUAL RANGE: "What is your glucose level usually?" (e.g., usual fasting morning value, usual evening value)     Has not been checking 4. KETONES: "Do you check for ketones (urine or blood test strips)?" If yes, ask: "What does the test show now?"      n/a 5. TYPE 1 or 2:  "Do you know what type of diabetes you have?"  (e.g., Type 1, Type 2, Gestational; doesn't know)      Pre diabetic 6. INSULIN: "Do you take insulin?" "What type of insulin(s) do you use? What is the mode of delivery? (syringe, pen; injection or pump)?"      no 7. DIABETES PILLS: "Do you take any pills for your diabetes?" If yes, ask: "Have you missed taking any pills recently?"     no 8. OTHER SYMPTOMS: "Do you have any symptoms?" (e.g., fever, frequent urination, difficulty breathing, dizziness, weakness, vomiting)     Vision changes, fatigue, dizziness, irritability  9. PREGNANCY: "Is there any chance you are pregnant?" "When was your last menstrual period?"     n/a  Protocols used: DIABETES - HIGH BLOOD SUGAR-A-AH

## 2020-07-19 NOTE — Progress Notes (Signed)
Established patient visit   Patient: Nicole Tucker   DOB: 04-17-1946   75 y.o. Female  MRN: [id number] Visit Date: 07/19/2020  Today's healthcare provider: Mar Daring, PA-C   Chief Complaint  Patient presents with  . elevated blood sugars   Subjective    HPI  Prediabetes, Follow-up  Lab Results  Component Value Date   HGBA1C 6.6 (A) 07/19/2020   HGBA1C 5.9 (H) 04/10/2019   HGBA1C 5.4 11/02/2017   GLUCOSE 106 (H) 04/10/2019   GLUCOSE 98 11/02/2017   GLUCOSE 104 (H) 10/27/2016    Last seen for for this1 years ago.  Management since that visit includes no medication changes. Current symptoms include none and have been worsening. Patient reports that her FBS at home has averaged in the 160s. She has also felt more fatigued lately.   Prior visit with dietician: no Current diet: well balanced Current exercise: no regular exercise  Pertinent Labs:    Component Value Date/Time   CHOL 208 (H) 04/10/2019 1212   TRIG 114 04/10/2019 1212   CHOLHDL 3.8 04/10/2019 1212   CREATININE 0.81 04/10/2019 1212   CREATININE 0.76 12/26/2011 1920    Wt Readings from Last 3 Encounters:  07/19/20 182 lb (82.6 kg)  04/08/20 181 lb 9.6 oz (82.4 kg)  12/29/19 180 lb (81.6 kg)     Patient Active Problem List   Diagnosis Date Noted  . Bilateral carotid artery stenosis 11/19/2016  . Diverticulitis large intestine 11/15/2015  . Other intestinal obstruction   . Diverticulitis of large intestine without perforation or abscess without bleeding   . Biceps tendinitis 08/14/2015  . Impingement syndrome of shoulder region 08/14/2015  . Anxiety 07/03/2015  . Diverticulitis 07/03/2015  . Benign essential HTN 07/03/2015  . Colon polyp 07/03/2015  . Borderline diabetes 07/03/2015  . Hypercholesterolemia without hypertriglyceridemia 07/03/2015  . Calculus of kidney 10/23/2013  . Microscopic hematuria 10/23/2013  . Kidney lump 10/23/2013   Past Medical History:  Diagnosis Date   . Arthritis   . Diabetes mellitus without complication ([place])    New DX - no meds yet  . Diverticular disease   . Hypertension        Medications: Outpatient Medications Prior to Visit  Medication Sig  . Ascorbic Acid (VITAMIN C PO) Take by mouth daily.  . B Complex Vitamins (VITAMIN B COMPLEX PO) Take by mouth daily.  . Cholecalciferol (VITAMIN D) 2000 units CAPS Take by mouth daily.   . Ginkgo Biloba 40 MG TABS Take 120 mg by mouth daily.   . hydrochlorothiazide (HYDRODIURIL) 25 MG tablet Take 1 tablet (25 mg total) by mouth daily.  Marland Kitchen lisinopril (ZESTRIL) 20 MG tablet Take 1.5 tablets (30 mg total) by mouth daily.  . Multiple Vitamin (MULTIVITAMIN WITH MINERALS) TABS tablet Take 1 tablet by mouth daily.  Marland Kitchen omega-3 acid ethyl esters (LOVAZA) 1 g capsule Take 1 g by mouth daily.  . TURMERIC PO Take by mouth daily at 6 (six) AM.  . VITAMIN A PO Take by mouth daily.  Marland Kitchen VITAMIN E PO Take 400 Units by mouth daily.   Marland Kitchen zinc gluconate 50 MG tablet Take 50 mg by mouth daily.  . Coenzyme Q10 (CO Q10) 200 MG CAPS Take by mouth daily.  (Patient not taking: No sig reported)  . fluorouracil (EFUDEX) 5 % cream APPLY TO NOSE AND LIP TWICE DAILY FOR FOUR WEEKS (Patient not taking: No sig reported)  . Gymnema Sylvestris Leaf POWD by Does not apply  route. (Patient not taking: No sig reported)  . methylPREDNISolone (MEDROL) 4 MG TBPK tablet 6 day taper; take as directed on instructions (Patient not taking: No sig reported)  . milk thistle 175 MG tablet Take 175 mg by mouth daily.  (Patient not taking: No sig reported)  . Multiple Vitamins-Minerals (ZINC PO) Take by mouth. (Patient not taking: No sig reported)   No facility-administered medications prior to visit.    Review of Systems  Constitutional: Positive for activity change and fatigue.  Respiratory: Negative.   Endocrine: Negative for polydipsia, polyphagia and polyuria.  Musculoskeletal: Negative.   Neurological: Negative.     Last  CBC Lab Results  Component Value Date   WBC 7.2 04/10/2019   HGB 15.5 04/10/2019   HCT 45.9 04/10/2019   MCV 96 04/10/2019   MCH 32.4 04/10/2019   RDW 12.7 04/10/2019   PLT 250 03/18/8526   Last metabolic panel Lab Results  Component Value Date   GLUCOSE 106 (H) 04/10/2019   NA 139 04/10/2019   K 4.3 04/10/2019   CL 99 04/10/2019   CO2 26 04/10/2019   BUN 12 04/10/2019   CREATININE 0.81 04/10/2019   GFRNONAA 73 04/10/2019   GFRAA 85 04/10/2019   CALCIUM 9.7 04/10/2019   PHOS 3.8 08/31/2015   PROT 7.5 04/10/2019   ALBUMIN 4.5 04/10/2019   LABGLOB 3.0 04/10/2019   AGRATIO 1.5 04/10/2019   BILITOT 0.8 04/10/2019   ALKPHOS 63 04/10/2019   AST 50 (H) 04/10/2019   ALT 78 (H) 04/10/2019   ANIONGAP 9 11/16/2015       Objective    BP (!) 135/59   Pulse 66   Temp 98 F (36.7 C)   Resp 16   Wt 182 lb (82.6 kg)   BMI 32.24 kg/m  BP Readings from Last 3 Encounters:  07/19/20 (!) 135/59  04/08/20 (!) 147/76  12/29/19 139/77   Wt Readings from Last 3 Encounters:  07/19/20 182 lb (82.6 kg)  04/08/20 181 lb 9.6 oz (82.4 kg)  12/29/19 180 lb (81.6 kg)       Physical Exam Vitals reviewed.  Constitutional:      General: She is not in acute distress.    Appearance: Normal appearance. She is well-developed and well-nourished. She is obese. She is not ill-appearing or diaphoretic.  HENT:     Head: Normocephalic and atraumatic.  Cardiovascular:     Rate and Rhythm: Normal rate and regular rhythm.     Heart sounds: Normal heart sounds. No murmur heard. No friction rub. No gallop.   Pulmonary:     Effort: Pulmonary effort is normal. No respiratory distress.     Breath sounds: Normal breath sounds. No wheezing or rales.  Musculoskeletal:     Cervical back: Normal range of motion and neck supple.  Neurological:     Mental Status: She is alert.  Psychiatric:        Mood and Affect: Mood normal.        Thought Content: Thought content normal.      Results for  orders placed or performed in visit on 07/19/20  POCT glycosylated hemoglobin (Hb A1C)  Result Value Ref Range   Hemoglobin A1C 6.6 (A) 4.0 - 5.6 %   HbA1c POC (<> result, manual entry)     HbA1c, POC (prediabetic range)     HbA1c, POC (controlled diabetic range)      Assessment & Plan     1. Borderline diabetes A1c up to 6.6 from  5.9.  - POCT glycosylated hemoglobin (Hb A1C)  2. New onset type 2 diabetes mellitus ([place]) New onset diabetes. Will start Metformin 500mg  daily. Patient up to date on pneumonia vaccines. Already on ACE I. Declines statin at this time. Declines diabetic education class at this time. Will work on lifestyle modifications with metformin. If A1c improved at CPE wants to stop Metformin and continue with lifestyle management for 6 months and recheck. CPE is in April 2022.  - metFORMIN (GLUCOPHAGE) 500 MG tablet; Take 1 tablet (500 mg total) by mouth daily with breakfast.  Dispense: 90 tablet; Refill: 1   No follow-ups on file.      Reynolds Bowl, PA-C, have reviewed all documentation for this visit. The documentation on 07/19/20 for the exam, diagnosis, procedures, and orders are all accurate and complete.   Rubye Beach  [facility] [phone] (phone) [phone] (fax)  [place]

## 2020-08-13 DIAGNOSIS — D2262 Melanocytic nevi of left upper limb, including shoulder: Secondary | ICD-10-CM | POA: Diagnosis not present

## 2020-08-13 DIAGNOSIS — Z85828 Personal history of other malignant neoplasm of skin: Secondary | ICD-10-CM | POA: Diagnosis not present

## 2020-08-13 DIAGNOSIS — L570 Actinic keratosis: Secondary | ICD-10-CM | POA: Diagnosis not present

## 2020-08-13 DIAGNOSIS — D2272 Melanocytic nevi of left lower limb, including hip: Secondary | ICD-10-CM | POA: Diagnosis not present

## 2020-08-13 DIAGNOSIS — L718 Other rosacea: Secondary | ICD-10-CM | POA: Diagnosis not present

## 2020-08-13 DIAGNOSIS — D225 Melanocytic nevi of trunk: Secondary | ICD-10-CM | POA: Diagnosis not present

## 2020-08-13 DIAGNOSIS — D2261 Melanocytic nevi of right upper limb, including shoulder: Secondary | ICD-10-CM | POA: Diagnosis not present

## 2020-08-13 DIAGNOSIS — X32XXXA Exposure to sunlight, initial encounter: Secondary | ICD-10-CM | POA: Diagnosis not present

## 2020-09-02 ENCOUNTER — Encounter: Payer: Medicare Other | Admitting: Physician Assistant

## 2020-09-05 ENCOUNTER — Encounter: Payer: Self-pay | Admitting: Family Medicine

## 2020-10-18 ENCOUNTER — Ambulatory Visit (INDEPENDENT_AMBULATORY_CARE_PROVIDER_SITE_OTHER): Payer: Medicare Other | Admitting: Family Medicine

## 2020-10-18 ENCOUNTER — Encounter: Payer: Self-pay | Admitting: Family Medicine

## 2020-10-18 ENCOUNTER — Other Ambulatory Visit: Payer: Self-pay

## 2020-10-18 VITALS — BP 131/58 | HR 56 | Ht 63.0 in | Wt 162.0 lb

## 2020-10-18 DIAGNOSIS — I6523 Occlusion and stenosis of bilateral carotid arteries: Secondary | ICD-10-CM

## 2020-10-18 DIAGNOSIS — Z1231 Encounter for screening mammogram for malignant neoplasm of breast: Secondary | ICD-10-CM

## 2020-10-18 DIAGNOSIS — Z0000 Encounter for general adult medical examination without abnormal findings: Secondary | ICD-10-CM

## 2020-10-18 DIAGNOSIS — I10 Essential (primary) hypertension: Secondary | ICD-10-CM

## 2020-10-18 DIAGNOSIS — E7800 Pure hypercholesterolemia, unspecified: Secondary | ICD-10-CM | POA: Diagnosis not present

## 2020-10-18 DIAGNOSIS — R7303 Prediabetes: Secondary | ICD-10-CM | POA: Diagnosis not present

## 2020-10-18 MED ORDER — LISINOPRIL-HYDROCHLOROTHIAZIDE 20-25 MG PO TABS: 1.0000 | ORAL_TABLET | Freq: Every day | ORAL | 3 refills | Status: DC

## 2020-10-18 MED ORDER — LISINOPRIL 10 MG PO TABS: 10.0000 mg | ORAL_TABLET | Freq: Every day | ORAL | 3 refills | Status: DC

## 2020-10-18 NOTE — Progress Notes (Signed)
Annual Wellness Visit     Patient: Nicole Tucker, Female    DOB: Jan 21, 1946, 75 y.o.   MRN: [id number] Visit Date: 10/18/2020  Today's Provider: Vernie Murders, PA-C   Chief Complaint  Patient presents with   Annual Exam   Subjective    Nicole Tucker is a 74 y.o. female who presents today for her Annual Wellness Visit. She reports consuming a general diet. The patient does not participate in regular exercise at present. She generally feels well. She reports sleeping well. She does not have additional problems to discuss today.    Patient Active Problem List   Diagnosis Date Noted   New onset type 2 diabetes mellitus ([place]) 07/19/2020   Bilateral carotid artery stenosis 11/19/2016   Diverticulitis large intestine 11/15/2015   Other intestinal obstruction    Diverticulitis of large intestine without perforation or abscess without bleeding    Biceps tendinitis 08/14/2015   Impingement syndrome of shoulder region 08/14/2015   Anxiety 07/03/2015   Diverticulitis 07/03/2015   Benign essential HTN 07/03/2015   Colon polyp 07/03/2015   Borderline diabetes 07/03/2015   Hypercholesterolemia without hypertriglyceridemia 07/03/2015   Calculus of kidney 10/23/2013   Microscopic hematuria 10/23/2013   Kidney lump 10/23/2013   Past Medical History:  Diagnosis Date   Arthritis    Diabetes mellitus without complication ([place])    New DX - no meds yet   Diverticular disease    Hypertension    Past Surgical History:  Procedure Laterality Date   CATARACT EXTRACTION     COLONOSCOPY N/A 10/07/2015   Procedure: COLONOSCOPY;  Surgeon: Lucilla Lame, MD;  Location: [facility];  Service: Endoscopy;  Laterality: N/A;   DILATION AND CURETTAGE OF UTERUS     LAPAROSCOPIC SIGMOID COLECTOMY N/A 11/15/2015   Procedure: LAPAROSCOPIC SIGMOID COLECTOMY possible open, possible colostomy;  Surgeon: Jules Husbands, MD;  Location: [facility];  Service: General;  Laterality: N/A;  Please have Hand  port ( gelport green by [facility]) ready   TUBAL LIGATION     Family History  Problem Relation Age of Onset   Heart disease Mother    Hypertension Mother    Aneurysm Mother    Alcohol abuse Father    Bladder Cancer Sister    Hypertension Sister    Breast cancer Sister 31   Hypertension Sister    Diverticulitis Brother    Hypertension Brother    Hypertension Sister    Diabetes Paternal Grandmother    Heart disease Paternal Grandmother    Social History   Tobacco Use   Smoking status: Former Smoker    Types: Cigarettes    Quit date: 11/29/1990    Years since quitting: 29.9   Smokeless tobacco: Never Used   Tobacco comment: quit 1992  Vaping Use   Vaping Use: Never used  Substance Use Topics   Alcohol use: Yes    Alcohol/week: 1.0 - 2.0 standard drink    Types: 1 - 2 Glasses of wine per week   Drug use: No   Allergies  Allergen Reactions   Codeine Itching and Anxiety    Other reaction(s): Other (See Comments) Nervous     Medications: Outpatient Medications Prior to Visit  Medication Sig   Ascorbic Acid (VITAMIN C PO) Take by mouth daily.   B Complex Vitamins (VITAMIN B COMPLEX PO) Take by mouth daily.   Cholecalciferol (VITAMIN D) 2000 units CAPS Take by mouth daily.    Ginkgo Biloba 40 MG TABS Take  120 mg by mouth daily.    hydrochlorothiazide (HYDRODIURIL) 25 MG tablet Take 1 tablet (25 mg total) by mouth daily.   lisinopril (ZESTRIL) 20 MG tablet Take 1.5 tablets (30 mg total) by mouth daily.   metFORMIN (GLUCOPHAGE) 500 MG tablet Take 1 tablet (500 mg total) by mouth daily with breakfast.   Multiple Vitamin (MULTIVITAMIN WITH MINERALS) TABS tablet Take 1 tablet by mouth daily.   omega-3 acid ethyl esters (LOVAZA) 1 g capsule Take 1 g by mouth daily.   TURMERIC PO Take by mouth daily at 6 (six) AM.   VITAMIN A PO Take by mouth daily.   VITAMIN E PO Take 400 Units by mouth daily.    zinc gluconate 50 MG tablet Take 50 mg by mouth daily.   No  facility-administered medications prior to visit.    Allergies  Allergen Reactions   Codeine Itching and Anxiety    Other reaction(s): Other (See Comments) Nervous    Patient Care Team: Idil Maslanka, Vickki Muff, PA-C as PCP - General (Family Medicine) Schnier, Dolores Lory, MD as Consulting Physician (Vascular Surgery) Oneta Rack, MD as Consulting Physician (Dermatology) Pa, [place] as Consulting Physician  Review of Systems  Constitutional: Negative.   HENT: Negative.   Eyes: Negative.   Respiratory: Negative.   Cardiovascular: Negative.   Gastrointestinal: Negative.   Endocrine: Negative.   Genitourinary: Negative.   Musculoskeletal: Negative.   Skin: Negative.   Allergic/Immunologic: Negative.   Neurological: Negative.   Hematological: Negative.   Psychiatric/Behavioral: Negative.      Objective    Vitals: BP (!) 131/58 (BP Location: Right Arm, Patient Position: Sitting, Cuff Size: Large)   Pulse (!) 56   Ht 5\' 3"  (1.6 m)   Wt 162 lb (73.5 kg)   SpO2 100%   BMI 28.70 kg/m   Wt Readings from Last 3 Encounters:  10/18/20 162 lb (73.5 kg)  07/19/20 182 lb (82.6 kg)  04/08/20 181 lb 9.6 oz (82.4 kg)   BP Readings from Last 3 Encounters:  10/18/20 (!) 131/58  07/19/20 (!) 135/59  04/08/20 (!) 147/76   Physical Exam Constitutional:      Appearance: She is well-developed.  HENT:     Head: Normocephalic and atraumatic.     Right Ear: External ear normal.     Left Ear: External ear normal.     Nose: Nose normal.  Eyes:     General:        Right eye: No discharge.     Conjunctiva/sclera: Conjunctivae normal.     Pupils: Pupils are equal, round, and reactive to light.  Neck:     Thyroid: No thyromegaly.     Trachea: No tracheal deviation.  Cardiovascular:     Rate and Rhythm: Normal rate and regular rhythm.     Heart sounds: Normal heart sounds. No murmur heard.   Pulmonary:     Effort: Pulmonary effort is normal. No respiratory distress.      Breath sounds: Normal breath sounds. No wheezing or rales.  Chest:     Chest wall: No tenderness.  Abdominal:     General: There is no distension.     Palpations: Abdomen is soft. There is no mass.     Tenderness: There is no abdominal tenderness. There is no guarding or rebound.  Musculoskeletal:        General: No tenderness. Normal range of motion.     Cervical back: Normal range of motion and neck supple.  Lymphadenopathy:     Cervical: No cervical adenopathy.  Skin:    General: Skin is warm and dry.     Findings: No erythema or rash.  Neurological:     Mental Status: She is alert and oriented to person, place, and time.     Cranial Nerves: No cranial nerve deficit.     Motor: No abnormal muscle tone.     Coordination: Coordination normal.     Deep Tendon Reflexes: Reflexes are normal and symmetric. Reflexes normal.  Psychiatric:        Behavior: Behavior normal.        Thought Content: Thought content normal.        Judgment: Judgment normal.      Most recent functional status assessment: In your present state of health, do you have any difficulty performing the following activities: 10/18/2020  Hearing? N  Vision? N  Difficulty concentrating or making decisions? N  Walking or climbing stairs? N  Dressing or bathing? N  Doing errands, shopping? N  Preparing Food and eating ? -  Using the Toilet? -  In the past six months, have you accidently leaked urine? -  Do you have problems with loss of bowel control? -  Managing your Medications? -  Managing your Finances? -  Housekeeping or managing your Housekeeping? -  Some recent data might be hidden   Most recent fall risk assessment: Fall Risk  10/18/2020  Falls in the past year? 0  Number falls in past yr: 0  Injury with Fall? 0  Risk for fall due to : No Fall Risks  Follow up Falls evaluation completed    Most recent depression screenings: PHQ 2/9 Scores 10/18/2020 06/25/2020  PHQ - 2 Score 0 0  PHQ- 9 Score  0 -   Most recent cognitive screening: 6CIT Screen 10/27/2016  What Year? 0 points  What month? 0 points  What time? 0 points  Count back from 20 0 points  Months in reverse 0 points  Repeat phrase 4 points  Total Score 4   Most recent Audit-C alcohol use screening Alcohol Use Disorder Test (AUDIT) 10/18/2020  1. How often do you have a drink containing alcohol? 3  2. How many drinks containing alcohol do you have on a typical day when you are drinking? 0  3. How often do you have six or more drinks on one occasion? 0  AUDIT-C Score 3  Alcohol Brief Interventions/Follow-up -   A score of 3 or more in women, and 4 or more in men indicates increased risk for alcohol abuse, EXCEPT if all of the points are from question 1   No results found for any visits on 10/18/20.  Assessment & Plan     Annual wellness visit done today including the all of the following: Reviewed patient's Family Medical History Reviewed and updated list of patient's medical providers Assessment of cognitive impairment was done Assessed patient's functional ability Established a written schedule for health screening [place] Completed and Reviewed  Exercise Activities and Dietary recommendations Goals      Exercise 3x per week (30 min per time)     Recommend increasing exercise to walking 3 days a week for 30 minutes.        Immunization History  Administered Date(s) Administered   Pneumococcal Conjugate-13 09/10/2015   Pneumococcal Polysaccharide-23 10/27/2016   Tdap 10/14/2009    Health Maintenance  Topic Date Due   COVID-19 Vaccine (1) Never done  FOOT EXAM  Never done   Zoster Vaccines- Shingrix (1 of 2) Never done   OPHTHALMOLOGY EXAM  05/13/2016   MAMMOGRAM  04/09/2020   DEXA SCAN  10/14/2020   TETANUS/TDAP  06/25/2021 (Originally 10/15/2019)   INFLUENZA VACCINE  12/23/2020   HEMOGLOBIN A1C  01/16/2021   COLONOSCOPY (Pts 45-26yrs Insurance coverage will need to be  confirmed)  10/06/2025   Hepatitis C Screening  Completed   PNA vac Low Risk Adult  Completed   HPV VACCINES  Aged Out     Discussed health benefits of physical activity, and encouraged her to engage in regular exercise appropriate for her age and condition.    1. Encounter for Medicare annual wellness exam Good general health. Counseled regarding health maintenance.  2. Essential hypertension Well controlled on Lisinopril 10 mg qd with Zestoretic 20-25 mg qd. Recheck labs. - CBC with Differential/Platelet - Comprehensive metabolic panel - Lipid panel - TSH - lisinopril-hydrochlorothiazide (ZESTORETIC) 20-25 MG tablet; Take 1 tablet by mouth daily.  Dispense: 90 tablet; Refill: 3 - lisinopril (ZESTRIL) 10 MG tablet; Take 1 tablet (10 mg total) by mouth daily.  Dispense: 90 tablet; Refill: 3  3. Borderline diabetes Tolerating Metformin 500 mg qd with diabetic diet. Last Hgb A1C was 6.6% onl 07-19-20. Recheck labs. - Comprehensive metabolic panel - Hemoglobin A1c  4. Breast cancer screening by mammogram - MM Digital Screening  5. Hypercholesterolemia without hypertriglyceridemia Lipids improved a year ago. Recheck to assess progress. - CBC with Differential/Platelet - Comprehensive metabolic panel - Lipid panel  6. Bilateral carotid artery stenosis Followed by vascular surgeon (Dr. Delana Meyer). Carotid Duplex done 04-08-20 showed RICA 63-78% and LICA <58%. - CBC with Differential/Platelet   No follow-ups on file.     I, Quandra Fedorchak, PA-C, have reviewed all documentation for this visit. The documentation on 10/18/20 for the exam, diagnosis, procedures, and orders are all accurate and complete.    Vernie Murders, PA-C  [employer] [phone] (phone) [phone] (fax)  [place]

## 2020-11-12 ENCOUNTER — Encounter: Payer: Self-pay | Admitting: Nurse Practitioner

## 2020-11-12 ENCOUNTER — Ambulatory Visit (INDEPENDENT_AMBULATORY_CARE_PROVIDER_SITE_OTHER): Payer: Medicare Other | Admitting: Nurse Practitioner

## 2020-11-12 ENCOUNTER — Other Ambulatory Visit: Payer: Self-pay

## 2020-11-12 VITALS — BP 145/63 | HR 56 | Temp 98.3°F | Resp 97 | Wt 158.6 lb

## 2020-11-12 DIAGNOSIS — I6523 Occlusion and stenosis of bilateral carotid arteries: Secondary | ICD-10-CM

## 2020-11-12 DIAGNOSIS — H60331 Swimmer's ear, right ear: Secondary | ICD-10-CM | POA: Diagnosis not present

## 2020-11-12 DIAGNOSIS — H6123 Impacted cerumen, bilateral: Secondary | ICD-10-CM | POA: Diagnosis not present

## 2020-11-12 MED ORDER — NEOMYCIN-POLYMYXIN-HC 3.5-10000-1 OT SOLN: 4.0000 [drp] | Freq: Four times a day (QID) | OTIC | 0 refills | Status: DC

## 2020-11-12 NOTE — Progress Notes (Signed)
Acute Office Visit  Subjective:    Patient ID: Nicole Tucker, female    DOB: Oct 14, 1945, 75 y.o.   MRN: [id number]  Chief Complaint  Patient presents with   Ear Fullness    Pt states her R ear has felt full for the past 8 days. States she has used debrox and rinsed her ear with a syringe with no luck     HPI Patient is in today for right ear fullness  EAG CLOGGED  Duration: 8 or 9 days Involved ear(s): yes "right Sensation of feeling clogged/plugged: yes Decreased/muffled hearing:yes Ear pain: no Fever: no Otorrhea: no Hearing loss: yes Upper respiratory infection symptoms: no Using Q-Tips: no Status: worse History of cerumenosis: yes Treatments attempted:  debrox and irrigating in shower   Past Medical History:  Diagnosis Date   Arthritis    Diabetes mellitus without complication ([place])    New DX - no meds yet   Diverticular disease    Hypertension     Past Surgical History:  Procedure Laterality Date   CATARACT EXTRACTION     COLONOSCOPY N/A 10/07/2015   Procedure: COLONOSCOPY;  Surgeon: Lucilla Lame, MD;  Location: [facility];  Service: Endoscopy;  Laterality: N/A;   DILATION AND CURETTAGE OF UTERUS     LAPAROSCOPIC SIGMOID COLECTOMY N/A 11/15/2015   Procedure: LAPAROSCOPIC SIGMOID COLECTOMY possible open, possible colostomy;  Surgeon: Jules Husbands, MD;  Location: [facility];  Service: General;  Laterality: N/A;  Please have Hand port ( gelport green by [facility]) ready   TUBAL LIGATION      Family History  Problem Relation Age of Onset   Heart disease Mother    Hypertension Mother    Aneurysm Mother    Alcohol abuse Father    Bladder Cancer Sister    Hypertension Sister    Breast cancer Sister 42   Hypertension Sister    Diverticulitis Brother    Hypertension Brother    Hypertension Sister    Diabetes Paternal Grandmother    Heart disease Paternal Grandmother     Social History   Socioeconomic History   Marital status: Divorced     Spouse name: Not on file   Number of children: 1   Years of education: Not on file   Highest education level: Bachelor's degree (e.g., BA, AB, BS)  Occupational History   Occupation: retired  Tobacco Use   Smoking status: Former    Pack years: 0.00    Types: Cigarettes    Quit date: 11/29/1990    Years since quitting: 29.9   Smokeless tobacco: Never   Tobacco comments:    quit 1992  Vaping Use   Vaping Use: Never used  Substance and Sexual Activity   Alcohol use: Yes    Alcohol/week: 1.0 - 2.0 standard drink    Types: 1 - 2 Glasses of wine per week   Drug use: No   Sexual activity: Not on file  Other Topics Concern   Not on file  Social History Narrative   Not on file   Social Determinants of Health   Financial Resource Strain: Low Risk    Difficulty of Paying Living Expenses: Not hard at all  Food Insecurity: No Food Insecurity   Worried About Charity fundraiser in the Last Year: Never true   Ran Out of Food in the Last Year: Never true  Transportation Needs: No Transportation Needs   Lack of Transportation (Medical): No   Lack of Transportation (Non-Medical):  No  Physical Activity: Inactive   Days of Exercise per Week: 0 days   Minutes of Exercise per Session: 0 min  Stress: No Stress Concern Present   Feeling of Stress : Not at all  Social Connections: Moderately Isolated   Frequency of Communication with Friends and Family: Once a week   Frequency of Social Gatherings with Friends and Family: More than three times a week   Attends Religious Services: Never   Marine scientist or Organizations: Yes   Attends Music therapist: More than 4 times per year   Marital Status: Divorced  Human resources officer Violence: Not At Risk   Fear of Current or Ex-Partner: No   Emotionally Abused: No   Physically Abused: No   Sexually Abused: No    Outpatient Medications Prior to Visit  Medication Sig Dispense Refill   Ascorbic Acid (VITAMIN C PO) Take by  mouth daily.     B Complex Vitamins (VITAMIN B COMPLEX PO) Take by mouth daily.     Cholecalciferol (VITAMIN D) 2000 units CAPS Take by mouth daily.      lisinopril (ZESTRIL) 10 MG tablet Take 1 tablet (10 mg total) by mouth daily. 90 tablet 3   lisinopril-hydrochlorothiazide (ZESTORETIC) 20-25 MG tablet Take 1 tablet by mouth daily. 90 tablet 3   metFORMIN (GLUCOPHAGE) 500 MG tablet Take 1 tablet (500 mg total) by mouth daily with breakfast. 90 tablet 1   Multiple Vitamin (MULTIVITAMIN WITH MINERALS) TABS tablet Take 1 tablet by mouth daily.     No facility-administered medications prior to visit.    Allergies  Allergen Reactions   Codeine Itching and Anxiety    Other reaction(s): Other (See Comments) Nervous    Review of Systems  Constitutional: Negative.   HENT:  Positive for ear pain (fullness in right ear) and hearing loss (right ear). Negative for congestion, postnasal drip, rhinorrhea, sinus pressure and sore throat.   Respiratory: Negative.    Cardiovascular: Negative.   Gastrointestinal: Negative.   Genitourinary: Negative.   Skin: Negative.   Neurological: Negative.       Objective:    Physical Exam Vitals and nursing note reviewed.  Constitutional:      General: She is not in acute distress.    Appearance: Normal appearance.  HENT:     Head: Normocephalic.     Right Ear: There is impacted cerumen.     Left Ear: There is impacted cerumen.     Ears:     Comments: White area noted on right ear drum after wax removal    Nose: No congestion.  Eyes:     Conjunctiva/sclera: Conjunctivae normal.  Skin:    General: Skin is warm.  Neurological:     General: No focal deficit present.     Mental Status: She is alert and oriented to person, place, and time.  Psychiatric:        Mood and Affect: Mood normal.        Behavior: Behavior normal.        Thought Content: Thought content normal.        Judgment: Judgment normal.    BP (!) 145/63   Pulse (!) 56   Temp  98.3 F (36.8 C) (Oral)   Resp (!) 97   Wt 158 lb 9.6 oz (71.9 kg)   BMI 28.09 kg/m  Wt Readings from Last 3 Encounters:  11/12/20 158 lb 9.6 oz (71.9 kg)  10/18/20 162 lb (73.5 kg)  07/19/20  182 lb (82.6 kg)    Health Maintenance Due  Topic Date Due   COVID-19 Vaccine (1) Never done   FOOT EXAM  Never done   Zoster Vaccines- Shingrix (1 of 2) Never done   OPHTHALMOLOGY EXAM  05/13/2016   MAMMOGRAM  04/09/2020   DEXA SCAN  10/14/2020    There are no preventive care reminders to display for this patient.   Lab Results  Component Value Date   TSH 2.200 11/02/2017   Lab Results  Component Value Date   WBC 7.2 04/10/2019   HGB 15.5 04/10/2019   HCT 45.9 04/10/2019   MCV 96 04/10/2019   PLT 250 04/10/2019   Lab Results  Component Value Date   NA 139 04/10/2019   K 4.3 04/10/2019   CO2 26 04/10/2019   GLUCOSE 106 (H) 04/10/2019   BUN 12 04/10/2019   CREATININE 0.81 04/10/2019   BILITOT 0.8 04/10/2019   ALKPHOS 63 04/10/2019   AST 50 (H) 04/10/2019   ALT 78 (H) 04/10/2019   PROT 7.5 04/10/2019   ALBUMIN 4.5 04/10/2019   CALCIUM 9.7 04/10/2019   ANIONGAP 9 11/16/2015   Lab Results  Component Value Date   CHOL 208 (H) 04/10/2019   Lab Results  Component Value Date   HDL 55 04/10/2019   Lab Results  Component Value Date   LDLCALC 133 (H) 04/10/2019   Lab Results  Component Value Date   TRIG 114 04/10/2019   Lab Results  Component Value Date   CHOLHDL 3.8 04/10/2019   Lab Results  Component Value Date   HGBA1C 6.6 (A) 07/19/2020       Assessment & Plan:   Problem List Items Addressed This Visit   None Visit Diagnoses     Bilateral impacted cerumen    -  Primary   Ears flushed with some results. Left ear still has cerumen blocking ear drum. Use debrox OTC. Right ear mostly clear, with white area on TM.    Acute swimmer's ear of right side       Symptoms x 8 days. Will treat with cortisporin ear drops. F/U if symptoms worsen or don't improve         Meds ordered this encounter  Medications   neomycin-polymyxin-hydrocortisone (CORTISPORIN) OTIC solution    Sig: Place 4 drops into the right ear 4 (four) times daily.    Dispense:  10 mL    Refill:  0     Charyl Dancer, NP

## 2020-11-19 DIAGNOSIS — Z961 Presence of intraocular lens: Secondary | ICD-10-CM | POA: Diagnosis not present

## 2020-11-19 LAB — HM DIABETES EYE EXAM

## 2020-11-22 DIAGNOSIS — I6523 Occlusion and stenosis of bilateral carotid arteries: Secondary | ICD-10-CM | POA: Diagnosis not present

## 2020-11-22 DIAGNOSIS — I10 Essential (primary) hypertension: Secondary | ICD-10-CM | POA: Diagnosis not present

## 2020-11-22 DIAGNOSIS — R7303 Prediabetes: Secondary | ICD-10-CM | POA: Diagnosis not present

## 2020-11-22 DIAGNOSIS — E7800 Pure hypercholesterolemia, unspecified: Secondary | ICD-10-CM | POA: Diagnosis not present

## 2020-11-23 LAB — COMPREHENSIVE METABOLIC PANEL
ALT: 65 IU/L — ABNORMAL HIGH (ref 0–32)
AST: 59 IU/L — ABNORMAL HIGH (ref 0–40)
Albumin/Globulin Ratio: 1.8 (ref 1.2–2.2)
Albumin: 4.6 g/dL (ref 3.7–4.7)
Alkaline Phosphatase: 57 IU/L (ref 44–121)
BUN/Creatinine Ratio: 13 (ref 12–28)
BUN: 12 mg/dL (ref 8–27)
Bilirubin Total: 1.3 mg/dL — ABNORMAL HIGH (ref 0.0–1.2)
CO2: 25 mmol/L (ref 20–29)
Calcium: 10.2 mg/dL (ref 8.7–10.3)
Chloride: 100 mmol/L (ref 96–106)
Creatinine, Ser: 0.89 mg/dL (ref 0.57–1.00)
Globulin, Total: 2.5 g/dL (ref 1.5–4.5)
Glucose: 94 mg/dL (ref 65–99)
Potassium: 4.7 mmol/L (ref 3.5–5.2)
Sodium: 140 mmol/L (ref 134–144)
Total Protein: 7.1 g/dL (ref 6.0–8.5)
eGFR: 69 mL/min/{1.73_m2} (ref >59)

## 2020-11-23 LAB — CBC WITH DIFFERENTIAL/PLATELET
Basophils Absolute: 0.1 10*3/uL (ref 0.0–0.2)
Basos: 1 %
EOS (ABSOLUTE): 0.2 10*3/uL (ref 0.0–0.4)
Eos: 2 %
Hematocrit: 48 % — ABNORMAL HIGH (ref 34.0–46.6)
Hemoglobin: 16.3 g/dL — ABNORMAL HIGH (ref 11.1–15.9)
Immature Grans (Abs): 0 10*3/uL (ref 0.0–0.1)
Immature Granulocytes: 0 %
Lymphocytes Absolute: 2.4 10*3/uL (ref 0.7–3.1)
Lymphs: 32 %
MCH: 34.2 pg — ABNORMAL HIGH (ref 26.6–33.0)
MCHC: 34 g/dL (ref 31.5–35.7)
MCV: 101 fL — ABNORMAL HIGH (ref 79–97)
Monocytes Absolute: 0.8 10*3/uL (ref 0.1–0.9)
Monocytes: 11 %
Neutrophils Absolute: 4 10*3/uL (ref 1.4–7.0)
Neutrophils: 54 %
Platelets: 202 10*3/uL (ref 150–450)
RBC: 4.77 x10E6/uL (ref 3.77–5.28)
RDW: 13.7 % (ref 11.7–15.4)
WBC: 7.5 10*3/uL (ref 3.4–10.8)

## 2020-11-23 LAB — LIPID PANEL
Chol/HDL Ratio: 3.4 ratio (ref 0.0–4.4)
Cholesterol, Total: 200 mg/dL — ABNORMAL HIGH (ref 100–199)
HDL: 58 mg/dL (ref >39)
LDL Chol Calc (NIH): 124 mg/dL — ABNORMAL HIGH (ref 0–99)
Triglycerides: 102 mg/dL (ref 0–149)
VLDL Cholesterol Cal: 18 mg/dL (ref 5–40)

## 2020-11-23 LAB — TSH: TSH: 3.07 u[IU]/mL (ref 0.450–4.500)

## 2020-11-23 LAB — HEMOGLOBIN A1C
Est. average glucose Bld gHb Est-mCnc: 111 mg/dL
Hgb A1c MFr Bld: 5.5 % (ref 4.8–5.6)

## 2020-12-20 ENCOUNTER — Other Ambulatory Visit: Payer: Self-pay | Admitting: Family Medicine

## 2020-12-20 DIAGNOSIS — Z1231 Encounter for screening mammogram for malignant neoplasm of breast: Secondary | ICD-10-CM

## 2020-12-23 ENCOUNTER — Encounter: Payer: Self-pay | Admitting: Family Medicine

## 2020-12-23 ENCOUNTER — Other Ambulatory Visit: Payer: Self-pay | Admitting: Family Medicine

## 2020-12-23 DIAGNOSIS — E119 Type 2 diabetes mellitus without complications: Secondary | ICD-10-CM

## 2020-12-23 MED ORDER — METFORMIN HCL 500 MG PO TABS: 500.0000 mg | ORAL_TABLET | Freq: Every day | ORAL | 1 refills | Status: DC

## 2020-12-25 ENCOUNTER — Ambulatory Visit
Admission: RE | Admit: 2020-12-25 | Discharge: 2020-12-25 | Disposition: A | Discharge status: Home / Self Care | Payer: Medicare Other | Source: Ambulatory Visit | Attending: Family Medicine | Admitting: Family Medicine

## 2020-12-25 ENCOUNTER — Other Ambulatory Visit: Payer: Self-pay

## 2020-12-25 DIAGNOSIS — Z1231 Encounter for screening mammogram for malignant neoplasm of breast: Secondary | ICD-10-CM | POA: Insufficient documentation

## 2021-01-14 DIAGNOSIS — H26491 Other secondary cataract, right eye: Secondary | ICD-10-CM | POA: Diagnosis not present

## 2021-01-14 DIAGNOSIS — H2512 Age-related nuclear cataract, left eye: Secondary | ICD-10-CM | POA: Diagnosis not present

## 2021-01-14 DIAGNOSIS — H18413 Arcus senilis, bilateral: Secondary | ICD-10-CM | POA: Diagnosis not present

## 2021-01-14 DIAGNOSIS — H25012 Cortical age-related cataract, left eye: Secondary | ICD-10-CM | POA: Diagnosis not present

## 2021-01-23 ENCOUNTER — Encounter: Payer: Self-pay | Admitting: Family Medicine

## 2021-02-13 ENCOUNTER — Encounter: Payer: Self-pay | Admitting: Family Medicine

## 2021-02-13 DIAGNOSIS — E119 Type 2 diabetes mellitus without complications: Secondary | ICD-10-CM

## 2021-02-13 DIAGNOSIS — I10 Essential (primary) hypertension: Secondary | ICD-10-CM

## 2021-02-13 MED ORDER — LISINOPRIL 10 MG PO TABS: 10.0000 mg | ORAL_TABLET | Freq: Every day | ORAL | 0 refills | Status: DC

## 2021-02-13 MED ORDER — METFORMIN HCL 500 MG PO TABS
500.0000 mg | ORAL_TABLET | Freq: Every day | ORAL | 0 refills | Status: DC
Start: 2021-02-13 — End: 2021-02-17

## 2021-02-13 MED ORDER — LISINOPRIL-HYDROCHLOROTHIAZIDE 20-25 MG PO TABS: 1.0000 | ORAL_TABLET | Freq: Every day | ORAL | 0 refills | Status: DC

## 2021-02-17 ENCOUNTER — Telehealth: Payer: Self-pay

## 2021-02-17 DIAGNOSIS — I10 Essential (primary) hypertension: Secondary | ICD-10-CM

## 2021-02-17 DIAGNOSIS — E119 Type 2 diabetes mellitus without complications: Secondary | ICD-10-CM

## 2021-02-17 MED ORDER — METFORMIN HCL 500 MG PO TABS: 500.0000 mg | ORAL_TABLET | Freq: Every day | ORAL | 0 refills | Status: DC

## 2021-02-17 MED ORDER — LISINOPRIL-HYDROCHLOROTHIAZIDE 20-25 MG PO TABS
1.0000 | ORAL_TABLET | Freq: Every day | ORAL | 0 refills | Status: DC
Start: 2021-02-17 — End: 2021-02-21

## 2021-02-17 NOTE — Telephone Encounter (Signed)
[facility] faxed refill request for the following medications:  metFORMIN (GLUCOPHAGE) 500 MG tablet  lisinopril-hydrochlorothiazide (ZESTORETIC) 20-25 MG tablet   It looks like this was sent to [facility] on 02/13/2021   Please advise.

## 2021-02-21 ENCOUNTER — Other Ambulatory Visit: Payer: Self-pay

## 2021-02-21 DIAGNOSIS — I10 Essential (primary) hypertension: Secondary | ICD-10-CM

## 2021-02-21 DIAGNOSIS — E119 Type 2 diabetes mellitus without complications: Secondary | ICD-10-CM

## 2021-02-21 DIAGNOSIS — H2512 Age-related nuclear cataract, left eye: Secondary | ICD-10-CM | POA: Diagnosis not present

## 2021-02-21 MED ORDER — METFORMIN HCL 500 MG PO TABS: 500.0000 mg | ORAL_TABLET | Freq: Every day | ORAL | 0 refills | Status: DC

## 2021-02-21 MED ORDER — LISINOPRIL-HYDROCHLOROTHIAZIDE 20-25 MG PO TABS: 1.0000 | ORAL_TABLET | Freq: Every day | ORAL | 0 refills | Status: DC

## 2021-03-18 ENCOUNTER — Encounter: Payer: Medicare Other | Admitting: Physician Assistant

## 2021-04-07 ENCOUNTER — Other Ambulatory Visit: Payer: Self-pay

## 2021-04-07 ENCOUNTER — Encounter (INDEPENDENT_AMBULATORY_CARE_PROVIDER_SITE_OTHER): Payer: Self-pay | Admitting: Vascular Surgery

## 2021-04-07 ENCOUNTER — Ambulatory Visit (INDEPENDENT_AMBULATORY_CARE_PROVIDER_SITE_OTHER): Payer: Medicare Other | Admitting: Vascular Surgery

## 2021-04-07 ENCOUNTER — Ambulatory Visit (INDEPENDENT_AMBULATORY_CARE_PROVIDER_SITE_OTHER): Payer: Medicare Other

## 2021-04-07 VITALS — BP 191/76 | HR 64 | Ht 60.3 in | Wt 140.0 lb

## 2021-04-07 DIAGNOSIS — I6523 Occlusion and stenosis of bilateral carotid arteries: Secondary | ICD-10-CM | POA: Diagnosis not present

## 2021-04-07 DIAGNOSIS — E7800 Pure hypercholesterolemia, unspecified: Secondary | ICD-10-CM

## 2021-04-07 DIAGNOSIS — I10 Essential (primary) hypertension: Secondary | ICD-10-CM | POA: Diagnosis not present

## 2021-04-07 DIAGNOSIS — E119 Type 2 diabetes mellitus without complications: Secondary | ICD-10-CM | POA: Diagnosis not present

## 2021-04-07 NOTE — Progress Notes (Signed)
MRN : [id number]  Nicole Tucker is a 75 y.o. (March 31, 1946) female who presents with chief complaint of check carotids.  History of Present Illness:  The patient is seen for follow up evaluation of carotid stenosis. The carotid stenosis followed by ultrasound.    The patient denies amaurosis fugax. There is no recent history of TIA symptoms or focal motor deficits. There is no prior documented CVA.   The patient is taking enteric-coated aspirin 81 mg daily.   There is no history of migraine headaches. There is no history of seizures.   The patient has a history of coronary artery disease, no recent episodes of angina or shortness of breath. The patient denies PAD or claudication symptoms. There is a history of hyperlipidemia which is being treated with a statin.     Carotid Duplex done today shows RICA 67-89% and LICA <38% (high grade left external stenosis).  No change compared to last study dated 04/08/2020   Current Meds  Medication Sig   Ascorbic Acid (VITAMIN C PO) Take by mouth daily.   B Complex Vitamins (VITAMIN B COMPLEX PO) Take by mouth daily.   Cholecalciferol (VITAMIN D) 2000 units CAPS Take by mouth daily.    lisinopril (ZESTRIL) 10 MG tablet Take 1 tablet (10 mg total) by mouth daily.   lisinopril-hydrochlorothiazide (ZESTORETIC) 20-25 MG tablet Take 1 tablet by mouth daily.   metFORMIN (GLUCOPHAGE) 500 MG tablet Take 1 tablet (500 mg total) by mouth daily with breakfast.   Multiple Vitamin (MULTIVITAMIN WITH MINERALS) TABS tablet Take 1 tablet by mouth daily.   neomycin-polymyxin-hydrocortisone (CORTISPORIN) OTIC solution Place 4 drops into the right ear 4 (four) times daily.    Past Medical History:  Diagnosis Date   Arthritis    Diabetes mellitus without complication ([place])    New DX - no meds yet   Diverticular disease    Hypertension     Past Surgical History:  Procedure Laterality Date   CATARACT EXTRACTION     COLONOSCOPY N/A 10/07/2015   Procedure:  COLONOSCOPY;  Surgeon: Lucilla Lame, MD;  Location: [facility];  Service: Endoscopy;  Laterality: N/A;   DILATION AND CURETTAGE OF UTERUS     LAPAROSCOPIC SIGMOID COLECTOMY N/A 11/15/2015   Procedure: LAPAROSCOPIC SIGMOID COLECTOMY possible open, possible colostomy;  Surgeon: Jules Husbands, MD;  Location: [facility];  Service: General;  Laterality: N/A;  Please have Hand port ( gelport green by [employer]) ready   TUBAL LIGATION      Social History Social History   Tobacco Use   Smoking status: Former    Types: Cigarettes    Quit date: 11/29/1990    Years since quitting: 30.3   Smokeless tobacco: Never   Tobacco comments:    quit 1992  Vaping Use   Vaping Use: Never used  Substance Use Topics   Alcohol use: Yes    Alcohol/week: 1.0 - 2.0 standard drink    Types: 1 - 2 Glasses of wine per week   Drug use: No    Family History Family History  Problem Relation Age of Onset   Heart disease Mother    Hypertension Mother    Aneurysm Mother    Alcohol abuse Father    Bladder Cancer Sister    Hypertension Sister    Breast cancer Sister 38   Hypertension Sister    Diverticulitis Brother    Hypertension Brother    Hypertension Sister    Diabetes Paternal Grandmother  Heart disease Paternal Grandmother     Allergies  Allergen Reactions   Codeine Itching and Anxiety    Other reaction(s): Other (See Comments) Nervous     REVIEW OF SYSTEMS (Negative unless checked)  Constitutional: [] Weight loss  [] Fever  [] Chills Cardiac: [] Chest pain   [] Chest pressure   [] Palpitations   [] Shortness of breath when laying flat   [] Shortness of breath with exertion. Vascular:  [] Pain in legs with walking   [] Pain in legs at rest  [] History of DVT   [] Phlebitis   [] Swelling in legs   [] Varicose veins   [] Non-healing ulcers Pulmonary:   [] Uses home oxygen   [] Productive cough   [] Hemoptysis   [] Wheeze  [] COPD   [] Asthma Neurologic:  [] Dizziness   [] Seizures   [] History of stroke    [] History of TIA  [] Aphasia   [] Vissual changes   [] Weakness or numbness in arm   [] Weakness or numbness in leg Musculoskeletal:   [] Joint swelling   [] Joint pain   [] Low back pain Hematologic:  [] Easy bruising  [] Easy bleeding   [] Hypercoagulable state   [] Anemic Gastrointestinal:  [] Diarrhea   [] Vomiting  [] Gastroesophageal reflux/heartburn   [] Difficulty swallowing. Genitourinary:  [] Chronic kidney disease   [] Difficult urination  [] Frequent urination   [] Blood in urine Skin:  [] Rashes   [] Ulcers  Psychological:  [] History of anxiety   []  History of major depression.  Physical Examination  Vitals:   04/07/21 1112  BP: (!) 191/76  Pulse: 64  Weight: 140 lb (63.5 kg)  Height: 5' 0.3" (1.532 m)   Body mass index is 27.07 kg/m. Gen: WD/WN, NAD Head: [place]/AT, No temporalis wasting.  Ear/Nose/Throat: Hearing grossly intact, nares w/o erythema or drainage Eyes: PER, EOMI, sclera nonicteric.  Neck: Supple, no masses.  No bruit or JVD.  Pulmonary:  Good air movement, no audible wheezing, no use of accessory muscles.  Cardiac: RRR, normal S1, S2, no Murmurs. Vascular:   left carotid bruit noted Vessel Right Left  Radial Palpable Palpable  Carotid Palpable Palpable  Gastrointestinal: soft, non-distended. No guarding/no peritoneal signs.  Musculoskeletal: M/S 5/5 throughout.  No visible deformity.  Neurologic: CN 2-12 intact. Pain and light touch intact in extremities.  Symmetrical.  Speech is fluent. Motor exam as listed above. Psychiatric: Judgment intact, Mood & affect appropriate for pt's clinical situation. Dermatologic: No rashes or ulcers noted.  No changes consistent with cellulitis.   CBC Lab Results  Component Value Date   WBC 7.5 11/22/2020   HGB 16.3 (H) 11/22/2020   HCT 48.0 (H) 11/22/2020   MCV 101 (H) 11/22/2020   PLT 202 11/22/2020    BMET    Component Value Date/Time   NA 140 11/22/2020 1022   NA 140 12/26/2011 1920   K 4.7 11/22/2020 1022   K 3.6 12/26/2011  1920   CL 100 11/22/2020 1022   CL 103 12/26/2011 1920   CO2 25 11/22/2020 1022   CO2 26 12/26/2011 1920   GLUCOSE 94 11/22/2020 1022   GLUCOSE 216 (H) 11/16/2015 0532   GLUCOSE 90 12/26/2011 1920   BUN 12 11/22/2020 1022   BUN 19 (H) 12/26/2011 1920   CREATININE 0.89 11/22/2020 1022   CREATININE 0.76 12/26/2011 1920   CALCIUM 10.2 11/22/2020 1022   CALCIUM 9.2 12/26/2011 1920   GFRNONAA 73 04/10/2019 1212   GFRNONAA >60 12/26/2011 1920   GFRAA 85 04/10/2019 1212   GFRAA >60 12/26/2011 1920   CrCl cannot be calculated (Patient's most recent lab result is older  than the maximum 21 days allowed.).  COAG Lab Results  Component Value Date   INR 1.11 08/31/2015   INR 1.00 08/30/2015    Radiology No results found.   Assessment/Plan 1. Bilateral carotid artery stenosis Recommend:   Given the patient's asymptomatic subcritical stenosis no further invasive testing or surgery at this time.   Carotid Duplex done today shows RICA 74-94% and LICA <49% (high grade left external stenosis).  No change compared to last study in 6 months ago   Continue antiplatelet therapy as prescribed Continue management of CAD, HTN and Hyperlipidemia Healthy heart diet,  encouraged exercise at least 4 times per week Follow up in 12 months with duplex ultrasound and physical exam   - VAS US CAROTID; Future  2. Benign essential HTN Continue antihypertensive medications as already ordered, these medications have been reviewed and there are no changes at this time.   3. New onset type 2 diabetes mellitus ([place]) Continue hypoglycemic medications as already ordered, these medications have been reviewed and there are no changes at this time.  Hgb A1C to be monitored as already arranged by primary service   4. Hypercholesterolemia without hypertriglyceridemia Continue statin as ordered and reviewed, no changes at this time     Hortencia Pilar, MD  04/07/2021 11:24 AM

## 2021-04-10 ENCOUNTER — Other Ambulatory Visit: Payer: Self-pay | Admitting: Physician Assistant

## 2021-04-10 DIAGNOSIS — I10 Essential (primary) hypertension: Secondary | ICD-10-CM

## 2021-04-10 MED ORDER — LISINOPRIL 10 MG PO TABS: 10.0000 mg | ORAL_TABLET | Freq: Every day | ORAL | 0 refills | Status: DC

## 2021-04-10 NOTE — Telephone Encounter (Signed)
Medication Refill - Medication: lisinopril (ZESTRIL) 10 MG tablet  Has the patient contacted their pharmacy? yes (Agent: If no, request that the patient contact the pharmacy for the refill. If patient does not wish to contact the pharmacy document the reason why and proceed with request.) (Agent: If yes, when and what did the pharmacy advise?)contact pcp  Preferred Pharmacy (with phone number or street name): [facility] [address]  [place] [phone] Has the patient been seen for an appointment in the last year OR does the patient have an upcoming appointment? yes  Agent: Please be advised that RX refills may take up to 3 business days. We ask that you follow-up with your pharmacy.

## 2021-04-10 NOTE — Telephone Encounter (Signed)
Needs OV for further refills.  Requested Prescriptions  Pending Prescriptions Disp Refills  . lisinopril (ZESTRIL) 10 MG tablet 90 tablet 0    Sig: Take 1 tablet (10 mg total) by mouth daily.     Cardiovascular:  ACE Inhibitors Failed - 04/10/2021 12:04 PM      Failed - Last BP in normal range    BP Readings from Last 1 Encounters:  04/07/21 (!) 191/76         Passed - Cr in normal range and within 180 days    Creatinine  Date Value Ref Range Status  12/26/2011 0.76 0.60 - 1.30 mg/dL Final   Creatinine, Ser  Date Value Ref Range Status  11/22/2020 0.89 0.57 - 1.00 mg/dL Final         Passed - K in normal range and within 180 days    Potassium  Date Value Ref Range Status  11/22/2020 4.7 3.5 - 5.2 mmol/L Final  12/26/2011 3.6 3.5 - 5.1 mmol/L Final         Passed - Patient is not pregnant      Passed - Valid encounter within last 6 months    Recent Outpatient Visits          4 months ago Bilateral impacted cerumen   [place], Lauren A, NP   8 months ago Borderline diabetes   [place], [place], [place]   1 year ago Left hip pain   [facility] [place], Clearnce Sorrel, [place]   3 years ago Annual physical exam   [facility] [place], Clearnce [place]rel, [place]   4 years ago Renal stone   [facility] [place] Malling M, [place]

## 2021-04-21 DIAGNOSIS — D2271 Melanocytic nevi of right lower limb, including hip: Secondary | ICD-10-CM | POA: Diagnosis not present

## 2021-04-21 DIAGNOSIS — L570 Actinic keratosis: Secondary | ICD-10-CM | POA: Diagnosis not present

## 2021-04-21 DIAGNOSIS — D2261 Melanocytic nevi of right upper limb, including shoulder: Secondary | ICD-10-CM | POA: Diagnosis not present

## 2021-04-21 DIAGNOSIS — Z85828 Personal history of other malignant neoplasm of skin: Secondary | ICD-10-CM | POA: Diagnosis not present

## 2021-04-21 DIAGNOSIS — D2272 Melanocytic nevi of left lower limb, including hip: Secondary | ICD-10-CM | POA: Diagnosis not present

## 2021-05-15 ENCOUNTER — Other Ambulatory Visit: Payer: Self-pay | Admitting: Family Medicine

## 2021-05-15 DIAGNOSIS — E119 Type 2 diabetes mellitus without complications: Secondary | ICD-10-CM

## 2021-05-15 NOTE — Telephone Encounter (Signed)
Requested medication (s) are due for refill today: yes (mail order) ° °Requested medication (s) are on the active medication list: yes ° °Last refill:  03/04/21 ° °Future visit scheduled: 07/01/2021 ° °Notes to clinic:  Failed protocol due to no valid visit within 6 months, dx/med last addressed 06/2020, upcoming visit 07/01/21, please assess. ° ° °Requested Prescriptions  °Pending Prescriptions Disp Refills  ° metFORMIN (GLUCOPHAGE) 500 MG tablet [Pharmacy Med Name: METFORMIN TAB 500MG] 90 tablet 0  °  Sig: TAKE 1 TABLET DAILY WITH   BREAKFAST  °  ° Endocrinology:  Diabetes - Biguanides Failed - 05/15/2021  7:14 PM  °  °  Failed - Valid encounter within last 6 months  °  Recent Outpatient Visits   ° °      ° 6 months ago Bilateral impacted cerumen  ° [facility] McElwe[place], Lauren A, NP  ° 10 months ago Borderline diabetes  ° [facility] [place], Jennifer M, PA-C  ° 1 year ago Left hip pain  ° [facility] Burnette, Jennifer M, PA-C  ° 3 years ago Annual physical exam  ° [facility] Burnette, Jennifer M, PA-C  ° 4 years ago Renal stone  ° [facility] Burnette, Jennifer M, PA-C  ° °  °  ° °  °  °  Passed - Cr in normal range and within 360 days  °  Creatinine  °Date Value Ref Range Status  °12/26/2011 0.76 0.60 - 1.30 mg/dL Final  ° °Creatinine, Ser  °Date Value Ref Range Status  °11/22/2020 0.89 0.57 - 1.00 mg/dL Final  °  °  °  °  Passed - HBA1C is between 0 and 7.9 and within 180 days  °  Hgb A1c MFr Bld  °Date Value Ref Range Status  °11/22/2020 5.5 4.8 - 5.6 % Final  °  Comment:  °           Prediabetes: 5.7 - 6.4 °         Diabetes: >6.4 °         Glycemic control for adults with diabetes: <7.0 °  °  °  °  °  Passed - eGFR in normal range and within 360 days  °  EGFR (African American)  °Date Value Ref Range Status  °12/26/2011 >60  Final  ° °GFR calc Af Amer  °Date Value Ref Range Status  °04/10/2019 85 >59 mL/min/1.73 Final  ° °EGFR  (Non-African Amer.)  °Date Value Ref Range Status  °12/26/2011 >60  Final  °  Comment:  °  eGFR values <60mL/min/1.73 m2 may be an indication of chronic °kidney disease (CKD). °Calculated eGFR is useful in patients with stable renal function. °The eGFR calculation will not be reliable in acutely ill patients °when serum creatinine is changing rapidly. It is not useful in  °patients on dialysis. The eGFR calculation may not be applicable °to patients at the low and high extremes of body sizes, pregnant °women, and vegetarians. °  ° °GFR calc non Af Amer  °Date Value Ref Range Status  °04/10/2019 73 >59 mL/min/1.73 Final  ° °eGFR  °Date Value Ref Range Status  °11/22/2020 69 >59 mL/min/1.73 Final  °  °  °  °  ° ° °

## 2021-05-21 ENCOUNTER — Telehealth: Payer: Self-pay

## 2021-05-21 NOTE — Telephone Encounter (Signed)
Copied from [place] [id number]. Topic: General - Other >> May 20, 2021  3:18 PM Leward Quan A wrote: Reason for CRM: Patient called in needing her AWV to be rescheduled to the end of February since she is not available on 07/01/21. Please call patient at Ph#  [phone] >> May 21, 2021 11:25 AM Leward Quan A wrote: Patient returned call to reschedule her appointment can be reached at number in previous message  >> May 21, 2021 11:13 AM Valaria Good wrote: Left message for patient to call Kirke Shaggy, nurse health advisor, to resch

## 2021-05-28 ENCOUNTER — Other Ambulatory Visit: Payer: Self-pay | Admitting: Physician Assistant

## 2021-05-28 DIAGNOSIS — E119 Type 2 diabetes mellitus without complications: Secondary | ICD-10-CM

## 2021-05-29 NOTE — Telephone Encounter (Signed)
Pt needs to keep appointment to discuss DMII and dosing of medication

## 2021-06-10 ENCOUNTER — Other Ambulatory Visit: Payer: Self-pay | Admitting: Family Medicine

## 2021-06-10 ENCOUNTER — Other Ambulatory Visit: Payer: Self-pay | Admitting: Physician Assistant

## 2021-06-10 DIAGNOSIS — I10 Essential (primary) hypertension: Secondary | ICD-10-CM

## 2021-06-10 DIAGNOSIS — E119 Type 2 diabetes mellitus without complications: Secondary | ICD-10-CM

## 2021-06-10 NOTE — Telephone Encounter (Signed)
Refilled 05/29/2021 by Mikey Kirschner. Requested Prescriptions  Pending Prescriptions Disp Refills   metFORMIN (GLUCOPHAGE) 500 MG tablet [Pharmacy Med Name: METFORMIN TAB 500MG] 60 tablet 0    Sig: TAKE 1 TABLET DAILY WITH   BREAKFAST     Endocrinology:  Diabetes - Biguanides Failed - 06/10/2021  7:56 PM      Failed - HBA1C is between 0 and 7.9 and within 180 days    Hgb A1c MFr Bld  Date Value Ref Range Status  11/22/2020 5.5 4.8 - 5.6 % Final    Comment:             Prediabetes: 5.7 - 6.4          Diabetes: >6.4          Glycemic control for adults with diabetes: <7.0          Failed - Valid encounter within last 6 months    Recent Outpatient Visits          7 months ago Bilateral impacted cerumen   [place], Lauren A, NP   10 months ago Borderline diabetes   [facility] [place], [place], [place]   1 year ago Left hip pain   [facility] [place], Clearnce Sorrel, [place]   3 years ago Annual physical exam   [facility] Fenton Malling M, [place]   4 years ago Renal stone   [facility] F[place] Malling M, [place]             Passed - Cr in normal range and within 360 days    Creatinine  Date Value Ref Range Status  12/26/2011 0.76 0.60 - 1.30 mg/dL Final   Creatinine, Ser  Date Value Ref Range Status  11/22/2020 0.89 0.57 - 1.00 mg/dL Final         Passed - eGFR in normal range and within 360 days    EGFR (African American)  Date Value Ref Range Status  12/26/2011 >60  Final   GFR calc Af Amer  Date Value Ref Range Status  04/10/2019 85 >59 mL/min/1.73 Final   EGFR (Non-African Amer.)  Date Value Ref Range Status  12/26/2011 >60  Final    Comment:    eGFR values <53m/min/1.73 m2 may be an indication of chronic kidney disease (CKD). Calculated eGFR is useful in patients with stable renal function. The eGFR calculation will not be reliable in acutely ill patients when serum  creatinine is changing rapidly. It is not useful in  patients on dialysis. The eGFR calculation may not be applicable to patients at the low and high extremes of body sizes, pregnant women, and vegetarians.    GFR calc non Af Amer  Date Value Ref Range Status  04/10/2019 73 >59 mL/min/1.73 Final   eGFR  Date Value Ref Range Status  11/22/2020 69 >59 mL/min/1.73 Final

## 2021-06-10 NOTE — Telephone Encounter (Signed)
Patient will need an appointment for further refills. Courtesy refill. Pt last seen for this problem 10/18/2020 with Simona Huh Chrismon. Requested Prescriptions  Pending Prescriptions Disp Refills   lisinopril-hydrochlorothiazide (ZESTORETIC) 20-25 MG tablet [Pharmacy Med Name: LISINOP/HCTZ TAB 20-25MG ] 30 tablet 0    Sig: TAKE 1 TABLET DAILY     Cardiovascular:  ACEI + Diuretic Combos Failed - 06/10/2021  7:56 PM      Failed - Na in normal range and within 180 days    Sodium  Date Value Ref Range Status  11/22/2020 140 134 - 144 mmol/L Final  12/26/2011 140 136 - 145 mmol/L Final         Failed - K in normal range and within 180 days    Potassium  Date Value Ref Range Status  11/22/2020 4.7 3.5 - 5.2 mmol/L Final  12/26/2011 3.6 3.5 - 5.1 mmol/L Final         Failed - Cr in normal range and within 180 days    Creatinine  Date Value Ref Range Status  12/26/2011 0.76 0.60 - 1.30 mg/dL Final   Creatinine, Ser  Date Value Ref Range Status  11/22/2020 0.89 0.57 - 1.00 mg/dL Final         Failed - Ca in normal range and within 180 days    Calcium  Date Value Ref Range Status  11/22/2020 10.2 8.7 - 10.3 mg/dL Final   Calcium, Total  Date Value Ref Range Status  12/26/2011 9.2 8.5 - 10.1 mg/dL Final         Failed - Last BP in normal range    BP Readings from Last 1 Encounters:  04/07/21 (!) 191/76         Failed - Valid encounter within last 6 months    Recent Outpatient Visits          7 months ago Bilateral impacted cerumen   [place], Lauren A, NP   10 months ago Borderline diabetes   [facility] [place], [place], [place]   1 year ago Left hip pain   [place], Clearnce Sorrel, [place]   3 years ago Annual physical exam   [facility] [place], Clearnce Sorrel, [place]   4 years ago Renal stone   [facility] Fenton Malling M, [place] - Patient is not pregnant

## 2021-06-11 ENCOUNTER — Telehealth: Payer: Self-pay

## 2021-06-11 DIAGNOSIS — I10 Essential (primary) hypertension: Secondary | ICD-10-CM

## 2021-06-11 MED ORDER — LISINOPRIL 10 MG PO TABS: 10.0000 mg | ORAL_TABLET | Freq: Every day | ORAL | 0 refills | Status: DC

## 2021-06-11 NOTE — Telephone Encounter (Signed)
[facility] for the following medications:  lisinopril (ZESTRIL) 10 MG tablet  Please advise.

## 2021-06-14 ENCOUNTER — Encounter: Payer: Self-pay | Admitting: Physician Assistant

## 2021-07-16 ENCOUNTER — Other Ambulatory Visit: Payer: Self-pay | Admitting: Physician Assistant

## 2021-07-16 DIAGNOSIS — E119 Type 2 diabetes mellitus without complications: Secondary | ICD-10-CM

## 2021-07-17 NOTE — Telephone Encounter (Signed)
Requested medication (s) are due for refill today: courtesy refill can be given  Requested medication (s) are on the active medication list: yes  Last refill:  05/29/21 #60 0 refills  Future visit scheduled: yes in 5 days  Notes to clinic:  last labs [facility] 11/22/20 . Future visit in 5 days . Do you want to refill same dose now ?     Requested Prescriptions  Pending Prescriptions Disp Refills   metFORMIN (GLUCOPHAGE) 500 MG tablet [Pharmacy Med Name: METFORMIN TAB 500MG] 60 tablet 0    Sig: TAKE 1 TABLET DAILY WITH   BREAKFAST     Endocrinology:  Diabetes - Biguanides Failed - 07/16/2021  6:42 PM      Failed - HBA1C is between 0 and 7.9 and within 180 days    Hgb A1c MFr Bld  Date Value Ref Range Status  11/22/2020 5.5 4.8 - 5.6 % Final    Comment:             Prediabetes: 5.7 - 6.4          Diabetes: >6.4          Glycemic control for adults with diabetes: <7.0           Failed - B12 Level in normal range and within 720 days    No results found for: VITAMINB12        Failed - Valid encounter within last 6 months    Recent Outpatient Visits           8 months ago Bilateral impacted cerumen   [facility] [place], Lauren A, NP   12 months ago Borderline diabetes   [employer], [place], [place]   1 year ago Left hip pain   [facility] [place], Clearnce Sorrel, [place]   3 years ago Annual physical exam   [place], Clearnce Sorrel, [place]   4 years ago Renal stone   [facility] Fenton Malling M, [place]       Future Appointments             In 5 days Drubel, Ria Comment, PA-C [employer], [facility]            Passed - Cr in normal range and within 360 days    Creatinine  Date Value Ref Range Status  12/26/2011 0.76 0.60 - 1.30 mg/dL Final   Creatinine, Ser  Date Value Ref Range Status  11/22/2020 0.89 0.57 - 1.00 mg/dL Final          Passed - eGFR in normal range and  within 360 days    EGFR (African American)  Date Value Ref Range Status  12/26/2011 >60  Final   GFR calc Af Amer  Date Value Ref Range Status  04/10/2019 85 >59 mL/min/1.73 Final   EGFR (Non-African Amer.)  Date Value Ref Range Status  12/26/2011 >60  Final    Comment:    eGFR values <29m/min/1.73 m2 may be an indication of chronic kidney disease (CKD). Calculated eGFR is useful in patients with stable renal function. The eGFR calculation will not be reliable in acutely ill patients when serum creatinine is changing rapidly. It is not useful in  patients on dialysis. The eGFR calculation may not be applicable to patients at the low and high extremes of body sizes, pregnant women, and vegetarians.    GFR calc non Af Amer  Date Value Ref Range Status  04/10/2019 73 >59 mL/min/1.73  Final   eGFR  Date Value Ref Range Status  11/22/2020 69 >59 mL/min/1.73 Final          Passed - CBC within normal limits and completed in the last 12 months    WBC  Date Value Ref Range Status  11/22/2020 7.5 3.4 - 10.8 x10E3/uL Final  11/16/2015 14.7 (H) 3.6 - 11.0 K/uL Final   RBC  Date Value Ref Range Status  11/22/2020 4.77 3.77 - 5.28 x10E6/uL Final  11/16/2015 4.65 3.80 - 5.20 MIL/uL Final   Hemoglobin  Date Value Ref Range Status  11/22/2020 16.3 (H) 11.1 - 15.9 g/dL Final   Hematocrit  Date Value Ref Range Status  11/22/2020 48.0 (H) 34.0 - 46.6 % Final   MCHC  Date Value Ref Range Status  11/22/2020 34.0 31.5 - 35.7 g/dL Final  11/16/2015 34.1 32.0 - 36.0 g/dL Final   [facility]  Date Value Ref Range Status  11/22/2020 34.2 (H) 26.6 - 33.0 pg Final  11/16/2015 32.0 26.0 - 34.0 pg Final   MCV  Date Value Ref Range Status  11/22/2020 101 (H) 79 - 97 fL Final  12/26/2011 94 80 - 100 fL Final   No results found for: PLTCOUNTKUC, LABPLAT, POCPLA RDW  Date Value Ref Range Status  11/22/2020 13.7 11.7 - 15.4 % Final  12/26/2011 13.9 11.5 - 14.5 % Final

## 2021-07-21 NOTE — Progress Notes (Signed)
I,Nicole Tucker,acting as a Education administrator for [employer], PA-C.,have documented all relevant documentation on the behalf of Nicole Kirschner, PA-C,as directed by  Nicole Kirschner, PA-C while in the presence of Nicole Kirschner, PA-C.  Established Patient Office Visit  Subjective:  Patient ID: Nicole Tucker, female    DOB: [birth year]  Age: 76 y.o. MRN: [id number]  CC: htn and DMII f/u   HPI Nicole Tucker is a 76 y/o female with HTN, DMII, CAD who presents today for a follow up of DMII and HTN. She reports a 40 pound weight loss since 2021. She wonders if she can stop Metformin.  She reports over the last year, a decreased sensation on the bottom of her little toes. Still has sensation and light touch feels the same, but when walking it feels different in her shoes.  Hypertension, follow-up  BP Readings from Last 3 Encounters:  07/22/21 134/70  04/07/21 (!) 191/76  11/12/20 (!) 145/63   Wt Readings from Last 3 Encounters:  07/22/21 140 lb 14.4 oz (63.9 kg)  04/07/21 140 lb (63.5 kg)  11/12/20 158 lb 9.6 oz (71.9 kg)     She was last seen for hypertension 1 years ago.  BP at that visit was 135/59. Management since that visit includes continue current treatment.  She reports excellent compliance with treatment. She is not having side effects.  She is following a Low Sodium diet. She is not exercising. She does not smoke.  Use of agents associated with hypertension: none.   Outside blood pressures are 135-155/below 80 , 134/ 70 last she remembers. Checks once weekly. Symptoms: No chest pain No chest pressure  No palpitations No syncope  No dyspnea No orthopnea  No paroxysmal nocturnal dyspnea No lower extremity edema   Pertinent labs: Lab Results  Component Value Date   CHOL 200 (H) 11/22/2020   HDL 58 11/22/2020   LDLCALC 124 (H) 11/22/2020   TRIG 102 11/22/2020   CHOLHDL 3.4 11/22/2020   Lab Results  Component Value Date   NA 140 11/22/2020   K 4.7 11/22/2020    CREATININE 0.89 11/22/2020   EGFR 69 11/22/2020   GLUCOSE 94 11/22/2020   TSH 3.070 11/22/2020     The 10-year ASCVD risk score (Arnett DK, et al., 2019) is: 32.9%   ---------------------------------------------------------------------------------------------------  Diabetes Mellitus Type II, Follow-up  Lab Results  Component Value Date   HGBA1C 5.3 07/22/2021   HGBA1C 5.5 11/22/2020   HGBA1C 6.6 (A) 07/19/2020   Wt Readings from Last 3 Encounters:  07/22/21 140 lb 14.4 oz (63.9 kg)  04/07/21 140 lb (63.5 kg)  11/12/20 158 lb 9.6 oz (71.9 kg)   Last seen for diabetes 1 years ago.  Management since then includes start Metformin 59m daily. She reports excellent compliance with treatment. She is not having side effects.  Symptoms: No fatigue No foot ulcerations  No appetite changes No nausea  No paresthesia of the feet  No polydipsia  No polyuria No visual disturbances   No vomiting     Home blood sugar records: fasting range: 101 sometimes middle of day 107-109  Episodes of hypoglycemia? No    Current insulin regiment: none Most Recent Eye Exam: 6 months ago Current exercise: yard work Current diet habits: in general, a "healthy" diet  , well balanced, low salt  Pertinent Labs: Lab Results  Component Value Date   CHOL 200 (H) 11/22/2020   HDL 58 11/22/2020   LDLCALC 124 (H) 11/22/2020   TRIG 102 11/22/2020  CHOLHDL 3.4 11/22/2020   Lab Results  Component Value Date   NA 140 11/22/2020   K 4.7 11/22/2020   CREATININE 0.89 11/22/2020   EGFR 69 11/22/2020   LABMICR See below: 12/09/2016     ---------------------------------------------------------------------------------------------------   Past Medical History:  Diagnosis Date   Arthritis    Diabetes mellitus without complication ([place])    New DX - no meds yet   Diverticular disease    Hypertension     Past Surgical History:  Procedure Laterality Date   CATARACT EXTRACTION     COLONOSCOPY N/A  10/07/2015   Procedure: COLONOSCOPY;  Surgeon: Lucilla Lame, MD;  Location: [facility];  Service: Endoscopy;  Laterality: N/A;   DILATION AND CURETTAGE OF UTERUS     LAPAROSCOPIC SIGMOID COLECTOMY N/A 11/15/2015   Procedure: LAPAROSCOPIC SIGMOID COLECTOMY possible open, possible colostomy;  Surgeon: Jules Husbands, MD;  Location: [facility];  Service: General;  Laterality: N/A;  Please have Hand port ( gelport green by [facility]) ready   TUBAL LIGATION      Family History  Problem Relation Age of Onset   Heart disease Mother    Hypertension Mother    Aneurysm Mother    Alcohol abuse Father    Bladder Cancer Sister    Hypertension Sister    Breast cancer Sister 83   Hypertension Sister    Diverticulitis Brother    Hypertension Brother    Hypertension Sister    Diabetes Paternal Grandmother    Heart disease Paternal Grandmother     Social History   Socioeconomic History   Marital status: Divorced    Spouse name: Not on file   Number of children: 1   Years of education: Not on file   Highest education level: Bachelor's degree (e.g., BA, AB, BS)  Occupational History   Occupation: retired  Tobacco Use   Smoking status: Former    Types: Cigarettes    Quit date: 11/29/1990    Years since quitting: 30.6   Smokeless tobacco: Never   Tobacco comments:    quit 1992  Vaping Use   Vaping Use: Never used  Substance and Sexual Activity   Alcohol use: Yes    Alcohol/week: 1.0 - 2.0 standard drink    Types: 1 - 2 Glasses of wine per week   Drug use: No   Sexual activity: Not on file  Other Topics Concern   Not on file  Social History Narrative   Not on file   Social Determinants of Health   Financial Resource Strain: Low Risk    Difficulty of Paying Living Expenses: Not hard at all  Food Insecurity: No Food Insecurity   Worried About Charity fundraiser in the Last Year: Never true   Ran Out of Food in the Last Year: Never true  Transportation Needs: No  Transportation Needs   Lack of Transportation (Medical): No   Lack of Transportation (Non-Medical): No  Physical Activity: Insufficiently Active   Days of Exercise per Week: 3 days   Minutes of Exercise per Session: 30 min  Stress: No Stress Concern Present   Feeling of Stress : Not at all  Social Connections: Socially Isolated   Frequency of Communication with Friends and Family: More than three times a week   Frequency of Social Gatherings with Friends and Family: Never   Attends Religious Services: Never   Marine scientist or Organizations: No   Attends Archivist Meetings: Never  Marital Status: Divorced  Human resources officer Violence: Not At Risk   Fear of Current or Ex-Partner: No   Emotionally Abused: No   Physically Abused: No   Sexually Abused: No    Outpatient Medications Prior to Visit  Medication Sig Dispense Refill   Ascorbic Acid (VITAMIN C PO) Take by mouth daily.     B Complex Vitamins (VITAMIN B COMPLEX PO) Take by mouth daily.     Cholecalciferol (VITAMIN D) 2000 units CAPS Take by mouth daily.      lisinopril (ZESTRIL) 10 MG tablet Take 1 tablet (10 mg total) by mouth daily. Please schedule office visit before any future refill. 90 tablet 0   lisinopril-hydrochlorothiazide (ZESTORETIC) 20-25 MG tablet TAKE 1 TABLET DAILY 30 tablet 0   metFORMIN (GLUCOPHAGE) 500 MG tablet Take 1 tablet (500 mg total) by mouth daily with breakfast. 60 tablet 0   Multiple Vitamin (MULTIVITAMIN WITH MINERALS) TABS tablet Take 1 tablet by mouth daily.     neomycin-polymyxin-hydrocortisone (CORTISPORIN) OTIC solution Place 4 drops into the right ear 4 (four) times daily. (Patient not taking: Reported on 07/22/2021) 10 mL 0   No facility-administered medications prior to visit.    Allergies  Allergen Reactions   Codeine Itching and Anxiety    Other reaction(s): Other (See Comments) Nervous    ROS Review of Systems  Constitutional:  Negative for fatigue and fever.   Respiratory:  Negative for cough and shortness of breath.   Cardiovascular:  Negative for chest pain and leg swelling.  Gastrointestinal:  Negative for abdominal pain.  Neurological:  Positive for numbness. Negative for dizziness and headaches.     Objective:    Physical Exam Constitutional:      Appearance: Normal appearance. She is not ill-appearing.  HENT:     Head: Normocephalic.  Eyes:     Conjunctiva/sclera: Conjunctivae normal.  Cardiovascular:     Rate and Rhythm: Normal rate and regular rhythm.     Pulses:          Dorsalis pedis pulses are 3+ on the right side and 3+ on the left side.     Heart sounds: Normal heart sounds.  Pulmonary:     Effort: Pulmonary effort is normal.     Breath sounds: Normal breath sounds.  Feet:     Right foot:     Protective Sensation: 3 sites tested.  3 sites sensed.     Left foot:     Protective Sensation: 3 sites tested.  3 sites sensed.     Comments: All three sites sensed but pt reports different sensation bilateral plantar surface 5th digit Neurological:     Mental Status: She is oriented to person, place, and time.  Psychiatric:        Mood and Affect: Mood normal.        Behavior: Behavior normal.    BP 134/70 Comment: home value   Pulse 68    Ht _0  (1.6 m)    Wt 140 lb 14.4 oz (63.9 kg)    SpO2 100%    BMI 24.96 kg/m  Wt Readings from Last 3 Encounters:  07/22/21 140 lb 14.4 oz (63.9 kg)  04/07/21 140 lb (63.5 kg)  11/12/20 158 lb 9.6 oz (71.9 kg)     Health Maintenance Due  Topic Date Due   FOOT EXAM  Never done   OPHTHALMOLOGY EXAM  05/13/2016   DEXA SCAN  10/14/2020    There are no preventive care reminders to display  for this patient.  Lab Results  Component Value Date   TSH 3.070 11/22/2020   Lab Results  Component Value Date   WBC 7.5 11/22/2020   HGB 16.3 (H) 11/22/2020   HCT 48.0 (H) 11/22/2020   MCV 101 (H) 11/22/2020   PLT 202 11/22/2020   Lab Results  Component Value Date   NA 140  11/22/2020   K 4.7 11/22/2020   CO2 25 11/22/2020   GLUCOSE 94 11/22/2020   BUN 12 11/22/2020   CREATININE 0.89 11/22/2020   BILITOT 1.3 (H) 11/22/2020   ALKPHOS 57 11/22/2020   AST 59 (H) 11/22/2020   ALT 65 (H) 11/22/2020   PROT 7.1 11/22/2020   ALBUMIN 4.6 11/22/2020   CALCIUM 10.2 11/22/2020   ANIONGAP 9 11/16/2015   EGFR 69 11/22/2020   Lab Results  Component Value Date   CHOL 200 (H) 11/22/2020   Lab Results  Component Value Date   HDL 58 11/22/2020   Lab Results  Component Value Date   LDLCALC 124 (H) 11/22/2020   Lab Results  Component Value Date   TRIG 102 11/22/2020   Lab Results  Component Value Date   CHOLHDL 3.4 11/22/2020   Lab Results  Component Value Date   HGBA1C 5.3 07/22/2021      Assessment & Plan:   Problem List Items Addressed This Visit       Cardiovascular and Mediastinum   Benign essential HTN - Primary    Home values reported by pt in normal ranges. Will continue to monitor advised pt to check at home 1-2 times a week.  If systolic consistently > 858 will need to reconsider meds        Endocrine   Diabetes mellitus without complication (HCC)    I5O today 5.3%. Okay with pt d/c metformin and rechecking in 3 months. Healthy 40 pound weight loss over the last 1.5 years.       Relevant Orders   POCT HgB A1C (Completed)   Urine Microalbumin w/creat. ratio   Hyperlipidemia associated with type 2 diabetes mellitus ([place])    Discussed statins at length. Explained MOA, purpose with DMII, hx of cartoid stenosis, prevention of heart attack, stroke. Pt would like to stay off medication if possible, declines statin for now. Reports her vascular doctor has also mentioned before. The 10-year ASCVD risk score (Arnett DK, et al., 2019) is: 32.9%       Relevant Orders   Lipid Profile     Musculoskeletal and Integument   Osteopenia of left hip    Last dexa 2017. Recheck for stability Pt takes calcium and vit d      Relevant Orders    DG Bone Density     Other   Elevated hemoglobin (HCC)    Historically last cbc, will recheck, dehydration (?) nonsmoker.      Relevant Orders   CBC w/Diff/Platelet   Elevated liver enzymes    Pt reports hx of elevated liver enzymes, saw a 'liver specialist' and was told she was normal. Will recheck if stable measure annually      Relevant Orders   Comprehensive Metabolic Panel (CMET)   Paresthesia of foot, bilateral    Unlikely DM in nature due to hx of prediabetes and brief A1c > 6.4%. ddx lumbar origin vs peripheral nerve etiology vs PAD d/t history of other CAD Advised if pt wants to investigate, would start with lumbar xray.       Relevant Orders   DG Lumbar  Spine Complete     Follow-up: Return in about 3 months (around 10/19/2021) for DMII.   I, Nicole Kirschner, PA-C have reviewed all documentation for this visit. The documentation on  07/22/2021  for the exam, diagnosis, procedures, and orders are all accurate and complete.  Nicole Kirschner, PA-C [facility] [address] [place], [place], [address] Office: [phone] Fax: [phone]

## 2021-07-22 ENCOUNTER — Other Ambulatory Visit: Payer: Self-pay

## 2021-07-22 ENCOUNTER — Ambulatory Visit (INDEPENDENT_AMBULATORY_CARE_PROVIDER_SITE_OTHER): Payer: Medicare Other

## 2021-07-22 ENCOUNTER — Encounter: Payer: Self-pay | Admitting: Physician Assistant

## 2021-07-22 ENCOUNTER — Ambulatory Visit (INDEPENDENT_AMBULATORY_CARE_PROVIDER_SITE_OTHER): Payer: Medicare Other | Admitting: Physician Assistant

## 2021-07-22 VITALS — BP 134/70 | HR 68 | Ht 63.0 in | Wt 140.9 lb

## 2021-07-22 DIAGNOSIS — Z1211 Encounter for screening for malignant neoplasm of colon: Secondary | ICD-10-CM | POA: Diagnosis not present

## 2021-07-22 DIAGNOSIS — Z0000 Encounter for general adult medical examination without abnormal findings: Secondary | ICD-10-CM | POA: Diagnosis not present

## 2021-07-22 DIAGNOSIS — E1169 Type 2 diabetes mellitus with other specified complication: Secondary | ICD-10-CM

## 2021-07-22 DIAGNOSIS — R202 Paresthesia of skin: Secondary | ICD-10-CM | POA: Diagnosis not present

## 2021-07-22 DIAGNOSIS — M85852 Other specified disorders of bone density and structure, left thigh: Secondary | ICD-10-CM

## 2021-07-22 DIAGNOSIS — D582 Other hemoglobinopathies: Secondary | ICD-10-CM

## 2021-07-22 DIAGNOSIS — R748 Abnormal levels of other serum enzymes: Secondary | ICD-10-CM

## 2021-07-22 DIAGNOSIS — Z1231 Encounter for screening mammogram for malignant neoplasm of breast: Secondary | ICD-10-CM

## 2021-07-22 DIAGNOSIS — E119 Type 2 diabetes mellitus without complications: Secondary | ICD-10-CM

## 2021-07-22 DIAGNOSIS — I10 Essential (primary) hypertension: Secondary | ICD-10-CM | POA: Diagnosis not present

## 2021-07-22 DIAGNOSIS — E785 Hyperlipidemia, unspecified: Secondary | ICD-10-CM | POA: Diagnosis not present

## 2021-07-22 DIAGNOSIS — Z780 Asymptomatic menopausal state: Secondary | ICD-10-CM

## 2021-07-22 LAB — POCT GLYCOSYLATED HEMOGLOBIN (HGB A1C): Hemoglobin A1C: 5.3 % (ref 4.0–5.6)

## 2021-07-22 NOTE — Patient Instructions (Signed)
[name] , Thank you for taking time to come for your Medicare Wellness Visit. I appreciate your ongoing commitment to your health goals. Please review the following plan we discussed and let me know if I can assist you in the future.   Screening recommendations/referrals: Colonoscopy: 10/07/15, referral sent Mammogram: 12/25/20 Bone Density: 10/15/15, referral sent Recommended yearly ophthalmology/optometry visit for glaucoma screening and checkup Recommended yearly dental visit for hygiene and checkup  Vaccinations: Influenza vaccine: n/d Pneumococcal vaccine: 10/27/16 Tdap vaccine: 10/14/09, due Shingles vaccine: n/d   Covid-19:n/d  Advanced directives: no  Conditions/risks identified: none  Next appointment: Follow up in one year for your annual wellness visit 07/23/22 @ 10:20am in person   Preventive Care 57 Years and Older, Female Preventive care refers to lifestyle choices and visits with your health care provider that can promote health and wellness. What does preventive care include? A yearly physical exam. This is also called an annual well check. Dental exams once or twice a year. Routine eye exams. Ask your health care provider how often you should have your eyes checked. Personal lifestyle choices, including: Daily care of your teeth and gums. Regular physical activity. Eating a healthy diet. Avoiding tobacco and drug use. Limiting alcohol use. Practicing safe sex. Taking low-dose aspirin every day. Taking vitamin and mineral supplements as recommended by your health care provider. What happens during an annual well check? The services and screenings done by your health care provider during your annual well check will depend on your age, overall health, lifestyle risk factors, and family history of disease. Counseling  Your health care provider may ask you questions about your: Alcohol use. Tobacco use. Drug use. Emotional well-being. Home and relationship  well-being. Sexual activity. Eating habits. History of falls. Memory and ability to understand (cognition). Work and work Statistician. Reproductive health. Screening  You may have the following tests or measurements: Height, weight, and BMI. Blood pressure. Lipid and cholesterol levels. These may be checked every 5 years, or more frequently if you are over 55 years old. Skin check. Lung cancer screening. You may have this screening every year starting at age 87 if you have a 30-pack-year history of smoking and currently smoke or have quit within the past 15 years. Fecal occult blood test (FOBT) of the stool. You may have this test every year starting at age 61. Flexible sigmoidoscopy or colonoscopy. You may have a sigmoidoscopy every 5 years or a colonoscopy every 10 years starting at age 90. Hepatitis C blood test. Hepatitis B blood test. Sexually transmitted disease (STD) testing. Diabetes screening. This is done by checking your blood sugar (glucose) after you have not eaten for a while (fasting). You may have this done every 1-3 years. Bone density scan. This is done to screen for osteoporosis. You may have this done starting at age 33. Mammogram. This may be done every 1-2 years. Talk to your health care provider about how often you should have regular mammograms. Talk with your health care provider about your test results, treatment options, and if necessary, the need for more tests. Vaccines  Your health care provider may recommend certain vaccines, such as: Influenza vaccine. This is recommended every year. Tetanus, diphtheria, and acellular pertussis (Tdap, Td) vaccine. You may need a Td booster every 10 years. Zoster vaccine. You may need this after age 12. Pneumococcal 13-valent conjugate (PCV13) vaccine. One dose is recommended after age 68. Pneumococcal polysaccharide (PPSV23) vaccine. One dose is recommended after age 33. Talk to your  health care provider about which  screenings and vaccines you need and how often you need them. This information is not intended to replace advice given to you by your health care provider. Make sure you discuss any questions you have with your health care provider. Document Released: 06/07/2015 Document Revised: 01/29/2016 Document Reviewed: 03/12/2015 Elsevier Interactive Patient Education  2017 [place] Prevention in the Home Falls can cause injuries. They can happen to people of all ages. There are many things you can do to make your home safe and to help prevent falls. What can I do on the outside of my home? Regularly fix the edges of walkways and driveways and fix any cracks. Remove anything that might make you trip as you walk through a door, such as a raised step or threshold. Trim any bushes or trees on the path to your home. Use bright outdoor lighting. Clear any walking paths of anything that might make someone trip, such as rocks or tools. Regularly check to see if handrails are loose or broken. Make sure that both sides of any steps have handrails. Any raised decks and porches should have guardrails on the edges. Have any leaves, snow, or ice cleared regularly. Use sand or salt on walking paths during winter. Clean up any spills in your garage right away. This includes oil or grease spills. What can I do in the bathroom? Use night lights. Install grab bars by the toilet and in the tub and shower. Do not use towel bars as grab bars. Use non-skid mats or decals in the tub or shower. If you need to sit down in the shower, use a plastic, non-slip stool. Keep the floor dry. Clean up any water that spills on the floor as soon as it happens. Remove soap buildup in the tub or shower regularly. Attach bath mats securely with double-sided non-slip rug tape. Do not have throw rugs and other things on the floor that can make you trip. What can I do in the bedroom? Use night lights. Make sure that you have a  light by your bed that is easy to reach. Do not use any sheets or blankets that are too big for your bed. They should not hang down onto the floor. Have a firm chair that has side arms. You can use this for support while you get dressed. Do not have throw rugs and other things on the floor that can make you trip. What can I do in the kitchen? Clean up any spills right away. Avoid walking on wet floors. Keep items that you use a lot in easy-to-reach places. If you need to reach something above you, use a strong step stool that has a grab bar. Keep electrical cords out of the way. Do not use floor polish or wax that makes floors slippery. If you must use wax, use non-skid floor wax. Do not have throw rugs and other things on the floor that can make you trip. What can I do with my stairs? Do not leave any items on the stairs. Make sure that there are handrails on both sides of the stairs and use them. Fix handrails that are broken or loose. Make sure that handrails are as long as the stairways. Check any carpeting to make sure that it is firmly attached to the stairs. Fix any carpet that is loose or worn. Avoid having throw rugs at the top or bottom of the stairs. If you do have throw rugs, attach them  to the floor with carpet tape. Make sure that you have a light switch at the top of the stairs and the bottom of the stairs. If you do not have them, ask someone to add them for you. What else can I do to help prevent falls? Wear shoes that: Do not have high heels. Have rubber bottoms. Are comfortable and fit you well. Are closed at the toe. Do not wear sandals. If you use a stepladder: Make sure that it is fully opened. Do not climb a closed stepladder. Make sure that both sides of the stepladder are locked into place. Ask someone to hold it for you, if possible. Clearly mark and make sure that you can see: Any grab bars or handrails. First and last steps. Where the edge of each step  is. Use tools that help you move around (mobility aids) if they are needed. These include: Canes. Walkers. Scooters. Crutches. Turn on the lights when you go into a dark area. Replace any light bulbs as soon as they burn out. Set up your furniture so you have a clear path. Avoid moving your furniture around. If any of your floors are uneven, fix them. If there are any pets around you, be aware of where they are. Review your medicines with your doctor. Some medicines can make you feel dizzy. This can increase your chance of falling. Ask your doctor what other things that you can do to help prevent falls. This information is not intended to replace advice given to you by your health care provider. Make sure you discuss any questions you have with your health care provider. Document Released: 03/07/2009 Document Revised: 10/17/2015 Document Reviewed: 06/15/2014 Elsevier Interactive Patient Education  2017 [employer].

## 2021-07-22 NOTE — Assessment & Plan Note (Signed)
Historically last cbc, will recheck, dehydration (?) nonsmoker.

## 2021-07-22 NOTE — Progress Notes (Signed)
Subjective:   Nicole Tucker is a 76 y.o. female who presents for Medicare Annual (Subsequent) preventive examination.  Review of Systems           Objective:    There were no vitals filed for this visit. There is no height or weight on file to calculate BMI.  Advanced Directives 06/25/2020 03/06/2019 11/02/2017 12/07/2016 11/19/2016 10/27/2016 11/15/2015  Does Patient Have a Medical Advance Directive? No No No No No No No  Would patient like information on creating a medical advance directive? No - Patient declined No - Patient declined Yes (MAU/Ambulatory/Procedural Areas - Information given) - - No - Patient declined Yes - Scientist, clinical (histocompatibility and immunogenetics) given;No - patient declined information    Current Medications (verified) Outpatient Encounter Medications as of 07/22/2021  Medication Sig   Ascorbic Acid (VITAMIN C PO) Take by mouth daily.   B Complex Vitamins (VITAMIN B COMPLEX PO) Take by mouth daily.   Cholecalciferol (VITAMIN D) 2000 units CAPS Take by mouth daily.    lisinopril (ZESTRIL) 10 MG tablet Take 1 tablet (10 mg total) by mouth daily. Please schedule office visit before any future refill.   lisinopril-hydrochlorothiazide (ZESTORETIC) 20-25 MG tablet TAKE 1 TABLET DAILY   metFORMIN (GLUCOPHAGE) 500 MG tablet Take 1 tablet (500 mg total) by mouth daily with breakfast.   Multiple Vitamin (MULTIVITAMIN WITH MINERALS) TABS tablet Take 1 tablet by mouth daily.   No facility-administered encounter medications on file as of 07/22/2021.    Allergies (verified) Codeine   History: Past Medical History:  Diagnosis Date   Arthritis    Diabetes mellitus without complication (HCC)    New DX - no meds yet   Diverticular disease    Hypertension    Past Surgical History:  Procedure Laterality Date   CATARACT EXTRACTION     COLONOSCOPY N/A 10/07/2015   Procedure: COLONOSCOPY;  Surgeon: Lucilla Lame, MD;  Location: [place];  Service: Endoscopy;  Laterality: N/A;   DILATION AND  CURETTAGE OF UTERUS     LAPAROSCOPIC SIGMOID COLECTOMY N/A 11/15/2015   Procedure: LAPAROSCOPIC SIGMOID COLECTOMY possible open, possible colostomy;  Surgeon: Jules Husbands, MD;  Location: [facility];  Service: General;  Laterality: N/A;  Please have Hand port ( gelport green by [employer]) ready   TUBAL LIGATION     Family History  Problem Relation Age of Onset   Heart disease Mother    Hypertension Mother    Aneurysm Mother    Alcohol abuse Father    Bladder Cancer Sister    Hypertension Sister    Breast cancer Sister 54   Hypertension Sister    Diverticulitis Brother    Hypertension Brother    Hypertension Sister    Diabetes Paternal Grandmother    Heart disease Paternal Grandmother    Social History   Socioeconomic History   Marital status: Divorced    Spouse name: Not on file   Number of children: 1   Years of education: Not on file   Highest education level: Bachelor's degree (e.g., BA, AB, BS)  Occupational History   Occupation: retired  Tobacco Use   Smoking status: Former    Types: Cigarettes    Quit date: 11/29/1990    Years since quitting: 30.6   Smokeless tobacco: Never   Tobacco comments:    quit 1992  Vaping Use   Vaping Use: Never used  Substance and Sexual Activity   Alcohol use: Yes    Alcohol/week: 1.0 - 2.0 standard drink  Types: 1 - 2 Glasses of wine per week   Drug use: No   Sexual activity: Not on file  Other Topics Concern   Not on file  Social History Narrative   Not on file   Social Determinants of Health   Financial Resource Strain: Not on file  Food Insecurity: Not on file  Transportation Needs: Not on file  Physical Activity: Not on file  Stress: Not on file  Social Connections: Not on file    Tobacco Counseling Counseling given: Not Answered Tobacco comments: quit 1992   Clinical Intake:  Pre-visit preparation completed: Yes  Pain : No/denies pain     Nutritional Risks: None Diabetes: Yes CBG done?: No Did  pt. bring in CBG monitor from home?: No  How often do you need to have someone help you when you read instructions, pamphlets, or other written materials from your doctor or pharmacy?: 1 - Never  Diabetic?no  Interpreter Needed?: No  Information entered by :: Kirke Shaggy, LPN   Activities of Daily Living In your present state of health, do you have any difficulty performing the following activities: 07/22/2021 10/18/2020  Hearing? N N  Vision? N N  Difficulty concentrating or making decisions? N N  Walking or climbing stairs? N N  Dressing or bathing? N N  Doing errands, shopping? N N  Some recent data might be hidden    Patient Care Team: Mikey Kirschner, PA-C as PCP - General (Physician Assistant) Delana Meyer, Dolores Lory, MD as Consulting Physician (Vascular Surgery) Oneta Rack, MD as Consulting Physician (Dermatology) Pa, [place] as Consulting Physician  Indicate any recent [place] you may have received from other than [facility] providers in the past year (date may be approximate).     Assessment:   This is a routine wellness examination for Nicole Tucker.  Hearing/Vision screen No results found.  Dietary issues and exercise activities discussed:     Goals Addressed   None    Depression Screen PHQ 2/9 Scores 07/22/2021 10/18/2020 06/25/2020 04/10/2019 03/06/2019 11/02/2017 10/27/2016  PHQ - 2 Score 0 0 0 0 0 0 0  PHQ- 9 Score 0 0 - - - - 4    Fall Risk Fall Risk  07/22/2021 10/18/2020 06/25/2020 04/10/2019 03/06/2019  Falls in the past year? 1 0 0 0 0  Number falls in past yr: 0 0 0 0 0  Injury with Fall? 0 0 0 0 0  Risk for fall due to : - No Fall Risks - - -  Follow up - Falls evaluation completed - Falls evaluation completed -    FALL RISK PREVENTION PERTAINING TO THE HOME:  Any stairs in or around the home? Yes  If so, are there any without handrails? No  Home free of loose throw rugs in walkways, pet beds, electrical cords, etc? Yes  Adequate  lighting in your home to reduce risk of falls? Yes   ASSISTIVE DEVICES UTILIZED TO PREVENT FALLS:  Life alert? No  Use of a cane, walker or w/c? No  Grab bars in the bathroom? No  Shower chair or bench in shower? No  Elevated toilet seat or a handicapped toilet? No   TIMED UP AND GO:  Was the test performed? Yes .  Length of time to ambulate 10 feet: 4 sec.   Gait steady and fast without use of assistive device  Cognitive Function:     6CIT Screen 10/27/2016  What Year? 0 points  What month? 0  points  What time? 0 points  Count back from 20 0 points  Months in reverse 0 points  Repeat phrase 4 points  Total Score 4    Immunizations Immunization History  Administered Date(s) Administered   Pneumococcal Conjugate-13 09/10/2015   Pneumococcal Polysaccharide-23 10/27/2016   Tdap 10/14/2009    TDAP status: Due, Education has been provided regarding the importance of this vaccine. Advised may receive this vaccine at local pharmacy or Health Dept. Aware to provide a copy of the vaccination record if obtained from local pharmacy or Health Dept. Verbalized acceptance and understanding.  Flu Vaccine status: Declined, Education has been provided regarding the importance of this vaccine but patient still declined. Advised may receive this vaccine at local pharmacy or Health Dept. Aware to provide a copy of the vaccination record if obtained from local pharmacy or Health Dept. Verbalized acceptance and understanding.  Pneumococcal vaccine status: Up to date  Covid-19 vaccine status: Declined, Education has been provided regarding the importance of this vaccine but patient still declined. Advised may receive this vaccine at local pharmacy or Health Dept.or vaccine clinic. Aware to provide a copy of the vaccination record if obtained from local pharmacy or Health Dept. Verbalized acceptance and understanding.  Qualifies for Shingles Vaccine? Yes   Zostavax completed No   Shingrix  Completed?: No.    Education has been provided regarding the importance of this vaccine. Patient has been advised to call insurance company to determine out of pocket expense if they have not yet received this vaccine. Advised may also receive vaccine at local pharmacy or Health Dept. Verbalized acceptance and understanding.  Screening Tests Health Maintenance  Topic Date Due   FOOT EXAM  Never done   OPHTHALMOLOGY EXAM  05/13/2016   DEXA SCAN  10/14/2020   HEMOGLOBIN A1C  05/25/2021   COVID-19 Vaccine (1) 08/07/2021 (Originally 09/08/1946)   INFLUENZA VACCINE  08/22/2021 (Originally 12/23/2020)   Zoster Vaccines- Shingrix (1 of 2) 10/19/2021 (Originally 03/10/1998)   TETANUS/TDAP  07/22/2022 (Originally 10/15/2019)   MAMMOGRAM  12/25/2021   COLONOSCOPY (Pts 45-36yrs Insurance coverage will need to be confirmed)  10/06/2025   Pneumonia Vaccine 3+ Years old  Completed   Hepatitis C Screening  Completed   HPV VACCINES  Aged Out    Health Maintenance  Health Maintenance Due  Topic Date Due   FOOT EXAM  Never done   OPHTHALMOLOGY EXAM  05/13/2016   DEXA SCAN  10/14/2020   HEMOGLOBIN A1C  05/25/2021    Colorectal cancer screening: Type of screening: Colonoscopy. Completed 10/07/15. Repeat every 5 years  Mammogram status: Completed 12/25/20. Repeat every year  Bone Density status: Completed 10/15/15. Results reflect: Bone density results: NORMAL. Repeat every 5 years.  Lung Cancer Screening: (Low Dose CT Chest recommended if Age 25-80 years, 30 pack-year currently smoking OR have quit w/in 15years.) does not qualify.    Additional Screening:  Hepatitis C Screening: does qualify; Completed 04/12/12  Vision Screening: Recommended annual ophthalmology exams for early detection of glaucoma and other disorders of the eye. Is the patient up to date with their annual eye exam?  Yes  Who is the provider or what is the name of the office in which the patient attends annual eye exams? [facility] If pt is not established with a provider, would they like to be referred to a provider to establish care? No .   Dental Screening: Recommended annual dental exams for proper oral hygiene  Community Resource Referral / Chronic  Care Management: CRR required this visit?  No   CCM required this visit?  No      Plan:     I have personally reviewed and noted the following in the patient's chart:   Medical and social history Use of alcohol, tobacco or illicit drugs  Current medications and supplements including opioid prescriptions.  Functional ability and status Nutritional status Physical activity Advanced directives List of other physicians Hospitalizations, surgeries, and ER visits in previous 12 months Vitals Screenings to include cognitive, depression, and falls Referrals and appointments  In addition, I have reviewed and discussed with patient certain preventive protocols, quality metrics, and best practice recommendations. A written personalized care plan for preventive services as well as general preventive health recommendations were provided to patient.     Dionisio David, LPN   [id number]/[id number]   Nurse Notes: none

## 2021-07-22 NOTE — Assessment & Plan Note (Signed)
Pt reports hx of elevated liver enzymes, saw a 'liver specialist' and was told she was normal. Will recheck if stable measure annually

## 2021-07-22 NOTE — Assessment & Plan Note (Signed)
Last dexa 2017. Recheck for stability Pt takes calcium and vit d

## 2021-07-22 NOTE — Assessment & Plan Note (Addendum)
Discussed statins at length. Explained MOA, purpose with DMII, hx of cartoid stenosis, prevention of heart attack, stroke. Pt would like to stay off medication if possible, declines statin for now. Reports her vascular doctor has also mentioned before. The 10-year ASCVD risk score (Arnett DK, et al., 2019) is: 32.9%

## 2021-07-22 NOTE — Assessment & Plan Note (Signed)
Unlikely DM in nature due to hx of prediabetes and brief A1c > 6.4%. ddx lumbar origin vs peripheral nerve etiology vs PAD d/t history of other CAD Advised if pt wants to investigate, would start with lumbar xray.

## 2021-07-22 NOTE — Assessment & Plan Note (Signed)
A1c today 5.3%. Okay with pt d/c metformin and rechecking in 3 months. Healthy 40 pound weight loss over the last 1.5 years.

## 2021-07-22 NOTE — Assessment & Plan Note (Signed)
Home values reported by pt in normal ranges. Will continue to monitor advised pt to check at home 1-2 times a week.  If systolic consistently > 657 will need to reconsider meds

## 2021-07-23 LAB — MICROALBUMIN / CREATININE URINE RATIO
Creatinine, Urine: 37.1 mg/dL
Microalb/Creat Ratio: <8 mg/g creat (ref 0–29)
Microalbumin, Urine: <3 ug/mL

## 2021-07-24 ENCOUNTER — Other Ambulatory Visit: Payer: Self-pay

## 2021-07-24 DIAGNOSIS — Z86010 Personal history of colonic polyps: Secondary | ICD-10-CM

## 2021-07-24 MED ORDER — NA SULFATE-K SULFATE-MG SULF 17.5-3.13-1.6 GM/177ML PO SOLN: 1.0000 | Freq: Once | ORAL | 0 refills | Status: AC

## 2021-07-24 NOTE — Progress Notes (Signed)
Gastroenterology Pre-Procedure Review ? ?Request Date: 08/18/2021 ?Requesting Physician: Dr. Allen Norris ? ?PATIENT REVIEW QUESTIONS: The patient responded to the following health history questions as indicated:   ? ?1. Are you having any GI issues? no ?2. Do you have a personal history of Polyps? yes (last colonoscopy 2017. Polyps removed) ?3. Do you have a family history of Colon Cancer or Polyps? yes (Brother: polyps) ?4. Diabetes Mellitus? no ?5. Joint replacements in the past 12 months?no ?6. Major health problems in the past 3 months?no ?7. Any artificial heart valves, MVP, or defibrillator?no ?   ?MEDICATIONS & ALLERGIES:    ?Patient reports the following regarding taking any anticoagulation/antiplatelet therapy:   ?Plavix, Coumadin, Eliquis, Xarelto, Lovenox, Pradaxa, Brilinta, or Effient? no ?Aspirin? no ? ?Patient confirms/reports the following medications:  ?Current Outpatient Medications  ?Medication Sig Dispense Refill  ? Ascorbic Acid (VITAMIN C PO) Take by mouth daily.    ? B Complex Vitamins (VITAMIN B COMPLEX PO) Take by mouth daily.    ? Cholecalciferol (VITAMIN D) 2000 units CAPS Take by mouth daily.     ? lisinopril (ZESTRIL) 10 MG tablet Take 1 tablet (10 mg total) by mouth daily. Please schedule office visit before any future refill. 90 tablet 0  ? lisinopril-hydrochlorothiazide (ZESTORETIC) 20-25 MG tablet TAKE 1 TABLET DAILY 30 tablet 0  ? metFORMIN (GLUCOPHAGE) 500 MG tablet Take 1 tablet (500 mg total) by mouth daily with breakfast. 60 tablet 0  ? Multiple Vitamin (MULTIVITAMIN WITH MINERALS) TABS tablet Take 1 tablet by mouth daily.    ? ?No current facility-administered medications for this visit.  ? ? ?Patient confirms/reports the following allergies:  ?Allergies  ?Allergen Reactions  ? Codeine Itching and Anxiety  ?  Other reaction(s): Other (See Comments) ?Nervous  ? ? ?No orders of the defined types were placed in this encounter. ? ? ?AUTHORIZATION INFORMATION ?Primary  Insurance: ?1D#: ?Group #: ? ?Secondary Insurance: ?1D#: ?Group #: ? ?SCHEDULE INFORMATION: ?Date: 08/18/2021 ?Time: ?Location: [place] ? ?

## 2021-08-11 DIAGNOSIS — R748 Abnormal levels of other serum enzymes: Secondary | ICD-10-CM | POA: Diagnosis not present

## 2021-08-11 DIAGNOSIS — D582 Other hemoglobinopathies: Secondary | ICD-10-CM | POA: Diagnosis not present

## 2021-08-11 DIAGNOSIS — E785 Hyperlipidemia, unspecified: Secondary | ICD-10-CM | POA: Diagnosis not present

## 2021-08-11 DIAGNOSIS — E1169 Type 2 diabetes mellitus with other specified complication: Secondary | ICD-10-CM | POA: Diagnosis not present

## 2021-08-12 ENCOUNTER — Encounter: Payer: Self-pay | Admitting: Physician Assistant

## 2021-08-12 LAB — CBC WITH DIFFERENTIAL/PLATELET
Basophils Absolute: 0.1 10*3/uL (ref 0.0–0.2)
Basos: 1 %
EOS (ABSOLUTE): 0.2 10*3/uL (ref 0.0–0.4)
Eos: 3 %
Hematocrit: 43.7 % (ref 34.0–46.6)
Hemoglobin: 15.1 g/dL (ref 11.1–15.9)
Immature Grans (Abs): 0 10*3/uL (ref 0.0–0.1)
Immature Granulocytes: 0 %
Lymphocytes Absolute: 2.9 10*3/uL (ref 0.7–3.1)
Lymphs: 40 %
MCH: 33.1 pg — ABNORMAL HIGH (ref 26.6–33.0)
MCHC: 34.6 g/dL (ref 31.5–35.7)
MCV: 96 fL (ref 79–97)
Monocytes Absolute: 0.7 10*3/uL (ref 0.1–0.9)
Monocytes: 10 %
Neutrophils Absolute: 3.4 10*3/uL (ref 1.4–7.0)
Neutrophils: 46 %
Platelets: 251 10*3/uL (ref 150–450)
RBC: 4.56 x10E6/uL (ref 3.77–5.28)
RDW: 12.6 % (ref 11.7–15.4)
WBC: 7.3 10*3/uL (ref 3.4–10.8)

## 2021-08-12 LAB — COMPREHENSIVE METABOLIC PANEL
ALT: 17 IU/L (ref 0–32)
AST: 24 IU/L (ref 0–40)
Albumin/Globulin Ratio: 1.6 (ref 1.2–2.2)
Albumin: 4.2 g/dL (ref 3.7–4.7)
Alkaline Phosphatase: 62 IU/L (ref 44–121)
BUN/Creatinine Ratio: 17 (ref 12–28)
BUN: 14 mg/dL (ref 8–27)
Bilirubin Total: 0.6 mg/dL (ref 0.0–1.2)
CO2: 28 mmol/L (ref 20–29)
Calcium: 10.1 mg/dL (ref 8.7–10.3)
Chloride: 99 mmol/L (ref 96–106)
Creatinine, Ser: 0.84 mg/dL (ref 0.57–1.00)
Globulin, Total: 2.7 g/dL (ref 1.5–4.5)
Glucose: 99 mg/dL (ref 70–99)
Potassium: 5.5 mmol/L — ABNORMAL HIGH (ref 3.5–5.2)
Sodium: 139 mmol/L (ref 134–144)
Total Protein: 6.9 g/dL (ref 6.0–8.5)
eGFR: 73 mL/min/{1.73_m2} (ref >59)

## 2021-08-12 LAB — LIPID PANEL
Chol/HDL Ratio: 4.1 ratio (ref 0.0–4.4)
Cholesterol, Total: 229 mg/dL — ABNORMAL HIGH (ref 100–199)
HDL: 56 mg/dL (ref >39)
LDL Chol Calc (NIH): 153 mg/dL — ABNORMAL HIGH (ref 0–99)
Triglycerides: 115 mg/dL (ref 0–149)
VLDL Cholesterol Cal: 20 mg/dL (ref 5–40)

## 2021-08-14 ENCOUNTER — Telehealth: Payer: Self-pay | Admitting: Gastroenterology

## 2021-08-14 NOTE — Telephone Encounter (Signed)
Patient states that suprep is "extremely expensive" and is requesting a cheaper alternative. Requesting a call back. Colonoscopy is on 08/25/2021. ?

## 2021-08-20 MED ORDER — PEG 3350-KCL-NABCB-NACL-NASULF 236 G PO SOLR
4000.0000 mL | Freq: Once | ORAL | 0 refills | Status: AC
Start: 2021-08-20 — End: 2021-08-20

## 2021-08-20 NOTE — Addendum Note (Signed)
Addended by: Lurlean Nanny on: 08/20/2021 05:16 PM ? ? Modules accepted: Orders ? ?

## 2021-08-25 ENCOUNTER — Ambulatory Visit: Payer: Medicare Other | Admitting: Anesthesiology

## 2021-08-25 ENCOUNTER — Ambulatory Visit
Admission: RE | Admit: 2021-08-25 | Discharge: 2021-08-25 | Disposition: A | Discharge status: Home / Self Care | Payer: Medicare Other | Attending: Gastroenterology | Admitting: Gastroenterology

## 2021-08-25 ENCOUNTER — Encounter: Payer: Self-pay | Admitting: Gastroenterology

## 2021-08-25 ENCOUNTER — Other Ambulatory Visit: Payer: Self-pay

## 2021-08-25 ENCOUNTER — Encounter
Admission: RE | Disposition: A | Discharge status: Home / Self Care | Payer: Self-pay | Source: Home / Self Care | Attending: Gastroenterology

## 2021-08-25 DIAGNOSIS — Z87891 Personal history of nicotine dependence: Secondary | ICD-10-CM | POA: Insufficient documentation

## 2021-08-25 DIAGNOSIS — Z86010 Personal history of colonic polyps: Secondary | ICD-10-CM

## 2021-08-25 DIAGNOSIS — I10 Essential (primary) hypertension: Secondary | ICD-10-CM | POA: Insufficient documentation

## 2021-08-25 DIAGNOSIS — Z1211 Encounter for screening for malignant neoplasm of colon: Secondary | ICD-10-CM

## 2021-08-25 DIAGNOSIS — Z980 Intestinal bypass and anastomosis status: Secondary | ICD-10-CM | POA: Insufficient documentation

## 2021-08-25 DIAGNOSIS — M1990 Unspecified osteoarthritis, unspecified site: Secondary | ICD-10-CM | POA: Diagnosis not present

## 2021-08-25 DIAGNOSIS — E119 Type 2 diabetes mellitus without complications: Secondary | ICD-10-CM | POA: Insufficient documentation

## 2021-08-25 HISTORY — PX: COLONOSCOPY WITH PROPOFOL: SHX5780

## 2021-08-25 SURGERY — COLONOSCOPY WITH PROPOFOL
Anesthesia: General | Site: Rectum

## 2021-08-25 MED ORDER — LACTATED RINGERS IV SOLN: INTRAVENOUS | Status: DC

## 2021-08-25 MED ORDER — ACETAMINOPHEN 500 MG PO TABS: 1000.0000 mg | ORAL_TABLET | Freq: Once | ORAL | Status: DC | PRN

## 2021-08-25 MED ORDER — SODIUM CHLORIDE 0.9 % IV SOLN: INTRAVENOUS | Status: DC

## 2021-08-25 MED ORDER — LIDOCAINE HCL (CARDIAC) PF 100 MG/5ML IV SOSY
PREFILLED_SYRINGE | INTRAVENOUS | Status: DC | PRN
  Administered 2021-08-25: 30 mg via INTRAVENOUS

## 2021-08-25 MED ORDER — ACETAMINOPHEN 160 MG/5ML PO SOLN: 975.0000 mg | Freq: Once | ORAL | Status: DC | PRN

## 2021-08-25 MED ORDER — STERILE WATER FOR IRRIGATION IR SOLN
Status: DC | PRN
  Administered 2021-08-25: 1

## 2021-08-25 MED ORDER — PROPOFOL 10 MG/ML IV BOLUS
INTRAVENOUS | Status: DC | PRN
  Administered 2021-08-25: 150 mg via INTRAVENOUS
  Administered 2021-08-25 (×2): 10 mg via INTRAVENOUS

## 2021-08-25 MED ORDER — STERILE WATER FOR IRRIGATION IR SOLN
Status: DC | PRN
  Administered 2021-08-25: 60 mL

## 2021-08-25 MED ORDER — ONDANSETRON HCL 4 MG/2ML IJ SOLN: 4.0000 mg | Freq: Once | INTRAMUSCULAR | Status: DC | PRN

## 2021-08-25 SURGICAL SUPPLY — 6 items
GOWN CVR UNV OPN BCK APRN NK (MISCELLANEOUS) ×2 IMPLANT
GOWN ISOL THUMB LOOP REG UNIV (MISCELLANEOUS) ×4
KIT PRC NS LF DISP ENDO (KITS) ×1 IMPLANT
KIT PROCEDURE OLYMPUS (KITS) ×2
MANIFOLD NEPTUNE II (INSTRUMENTS) ×2 IMPLANT
WATER STERILE IRR 250ML POUR (IV SOLUTION) ×2 IMPLANT

## 2021-08-25 NOTE — Anesthesia Postprocedure Evaluation (Signed)
Anesthesia Post Note ? ?Patient: Nicole Tucker ? ?Procedure(s) Performed: COLONOSCOPY WITH PROPOFOL (Rectum) ? ? ?  ?Patient location during evaluation: PACU ?Anesthesia Type: General ?Level of consciousness: awake and alert ?Pain management: pain level controlled ?Vital Signs Assessment: post-procedure vital signs reviewed and stable ?Respiratory status: spontaneous breathing, nonlabored ventilation and respiratory function stable ?Cardiovascular status: blood pressure returned to baseline and stable ?Postop Assessment: no apparent nausea or vomiting ?Anesthetic complications: no ? ? ?No notable events documented. ? ?April Manson ? ? ? ? ? ?

## 2021-08-25 NOTE — H&P (Signed)
? ?Lucilla Lame, MD [facility] ?[address] ?[place], [place] [address] ?Phone: [phone] ?Fax : [phone] ? ?Primary Care Physician:  Mikey Kirschner, PA-C ?Primary Gastroenterologist:  Dr. Allen Norris ? ?Pre-Procedure History & Physical: ?HPI:  Nicole Tucker is a 76 y.o. female is here for a screening colonoscopy.  ? ?Past Medical History:  ?Diagnosis Date  ? Arthritis   ? Diabetes mellitus without complication ([place])   ? New DX - no meds yet  ? Diverticular disease   ? Hypertension   ? ? ?Past Surgical History:  ?Procedure Laterality Date  ? CATARACT EXTRACTION    ? COLONOSCOPY N/A 10/07/2015  ? Procedure: COLONOSCOPY;  Surgeon: Lucilla Lame, MD;  Location: [place];  Service: Endoscopy;  Laterality: N/A;  ? DILATION AND CURETTAGE OF UTERUS    ? LAPAROSCOPIC SIGMOID COLECTOMY N/A 11/15/2015  ? Procedure: LAPAROSCOPIC SIGMOID COLECTOMY possible open, possible colostomy;  Surgeon: Jules Husbands, MD;  Location: [facility];  Service: General;  Laterality: N/A;  Please have Hand port ( gelport green by [employer] [facility]) ready  ? TUBAL LIGATION    ? ? ?Prior to Admission medications   ?Medication Sig Start Date End Date Taking? Authorizing Provider  ?Ascorbic Acid (VITAMIN C PO) Take by mouth daily.   Yes [provider]  ?B Complex Vitamins (VITAMIN B COMPLEX PO) Take by mouth daily.   Yes [provider]  ?Bethena Midget (BERBERINE COMPLEX PO) Take by mouth.   Yes [provider]  ?BIOTIN PO Take by mouth.   Yes [provider]  ?Calcium Carb-Cholecalciferol 333-3.325 MG-MCG TABS Take by mouth.   Yes [provider]  ?Cholecalciferol (VITAMIN D) 2000 units CAPS Take by mouth daily.    Yes [provider]  ?COLLAGEN PO Take by mouth.   Yes [provider]  ?Garlic 7824 MG CAPS Take 3,000 mg by mouth.   Yes [provider]  ?Iron Combinations (IRON COMPLEX PO) Take by mouth.   Yes [provider]  ?lisinopril (ZESTRIL)  10 MG tablet Take 1 tablet (10 mg total) by mouth daily. Please schedule office visit before any future refill. 06/11/21  Yes Gwyneth Sprout, FNP  ?lisinopril-hydrochlorothiazide (ZESTORETIC) 20-25 MG tablet TAKE 1 TABLET DAILY 06/10/21  Yes Jerrol Banana., MD  ?Multiple Vitamin (MULTIVITAMIN WITH MINERALS) TABS tablet Take 1 tablet by mouth daily.   Yes [provider]  ?Omega-3 Fatty Acids (FISH OIL ODOR-LESS PO) Take by mouth.   Yes [provider]  ?VITAMIN A PO Take by mouth.   Yes [provider]  ?ZINC PICOLINATE PO Take by mouth.   Yes [provider]  ? ? ?Allergies as of 07/24/2021 - Review Complete 07/22/2021  ?Allergen Reaction Noted  ? Codeine Itching and Anxiety 10/23/2013  ? ? ?Family History  ?Problem Relation Age of Onset  ? Heart disease Mother   ? Hypertension Mother   ? Aneurysm Mother   ? Alcohol abuse Father   ? Bladder Cancer Sister   ? Hypertension Sister   ? Breast cancer Sister 55  ? Hypertension Sister   ? Diverticulitis Brother   ? Hypertension Brother   ? Hypertension Sister   ? Diabetes Paternal Grandmother   ? Heart disease Paternal Grandmother   ? ? ?Social History  ? ?Socioeconomic History  ? Marital status: Divorced  ?  Spouse name: Not on file  ? Number of children: 1  ? Years of education: Not on file  ? Highest  education level: Bachelor's degree (e.g., BA, AB, BS)  ?Occupational History  ? Occupation: retired  ?Tobacco Use  ? Smoking status: Former  ?  Types: Cigarettes  ?  Quit date: 11/29/1990  ?  Years since quitting: 30.7  ? Smokeless tobacco: Never  ? Tobacco comments:  ?  quit 1992  ?Vaping Use  ? Vaping Use: Never used  ?Substance and Sexual Activity  ? Alcohol use: Yes  ?  Alcohol/week: 1.0 - 2.0 standard drink  ?  Types: 1 - 2 Glasses of wine per week  ? Drug use: No  ? Sexual activity: Not on file  ?Other Topics Concern  ? Not on file  ?Social History Narrative  ? Not on file  ? ?Social Determinants of Health  ? ?Financial  Resource Strain: Low Risk   ? Difficulty of Paying Living Expenses: Not hard at all  ?Food Insecurity: No Food Insecurity  ? Worried About Charity fundraiser in the Last Year: Never true  ? Ran Out of Food in the Last Year: Never true  ?Transportation Needs: No Transportation Needs  ? Lack of Transportation (Medical): No  ? Lack of Transportation (Non-Medical): No  ?Physical Activity: Insufficiently Active  ? Days of Exercise per Week: 3 days  ? Minutes of Exercise per Session: 30 min  ?Stress: No Stress Concern Present  ? Feeling of Stress : Not at all  ?Social Connections: Socially Isolated  ? Frequency of Communication with Friends and Family: More than three times a week  ? Frequency of Social Gatherings with Friends and Family: Never  ? Attends Religious Services: Never  ? Active Member of Clubs or Organizations: No  ? Attends Archivist Meetings: Never  ? Marital Status: Divorced  ?Intimate Partner Violence: Not At Risk  ? Fear of Current or Ex-Partner: No  ? Emotionally Abused: No  ? Physically Abused: No  ? Sexually Abused: No  ? ? ?Review of Systems: ?See HPI, otherwise negative ROS ? ?Physical Exam: ?BP (!) 150/63   Pulse 62   Temp 98.1 ?F (36.7 ?C) (Temporal)   Resp 18   Ht '5\' 3"'$  (1.6 m)   Wt 60.3 kg   SpO2 99%   BMI 23.56 kg/m?  ?General:   Alert,  pleasant and cooperative in NAD ?Head:  Normocephalic and atraumatic. ?Neck:  Supple; no masses or thyromegaly. ?Lungs:  Clear throughout to auscultation.    ?Heart:  Regular rate and rhythm. ?Abdomen:  Soft, nontender and nondistended. Normal bowel sounds, without guarding, and without rebound.   ?Neurologic:  Alert and  oriented x4;  grossly normal neurologically. ? ?Impression/Plan: ?Nicole Tucker is now here to undergo a screening colonoscopy. ? ?Risks, benefits, and alternatives regarding colonoscopy have been reviewed with the patient.  Questions have been answered.  All parties agreeable. ?

## 2021-08-25 NOTE — Anesthesia Preprocedure Evaluation (Addendum)
Anesthesia Evaluation  ?Patient identified by MRN, date of birth, ID band ?Patient awake ? ? ? ?Reviewed: ?Allergy & Precautions, H&P , NPO status , Patient's Chart, lab work & pertinent test results, reviewed documented beta blocker date and time  ? ?Airway ?Mallampati: I ? ?TM Distance: >3 FB ?Neck ROM: full ? ? ? Dental ?no notable dental hx. ? ?  ?Pulmonary ?neg pulmonary ROS, former smoker,  ?  ?Pulmonary exam normal ?breath sounds clear to auscultation ? ? ? ? ? ? Cardiovascular ?Exercise Tolerance: Good ?hypertension, negative cardio ROS ? ? ?Rhythm:regular Rate:Normal ? ? ?  ?Neuro/Psych ?negative neurological ROS ? negative psych ROS  ? GI/Hepatic ?negative GI ROS, Neg liver ROS,   ?Endo/Other  ?negative endocrine ROSdiabetes ? Renal/GU ?negative Renal ROS  ?negative genitourinary ?  ?Musculoskeletal ? ?(+) Arthritis ,  ? Abdominal ?  ?Peds ? Hematology ?negative hematology ROS ?(+)   ?Anesthesia Other Findings ? ? Reproductive/Obstetrics ?negative OB ROS ? ?  ? ? ? ? ? ? ? ? ? ? ? ? ? ?  ?  ? ? ? ? ? ? ? ?Anesthesia Physical ?Anesthesia Plan ? ?ASA: 2 ? ?Anesthesia Plan: General  ? ?Post-op Pain Management:   ? ?Induction:  ? ?PONV Risk Score and Plan: 3 and TIVA, Propofol infusion and Treatment may vary due to age or medical condition ? ?Airway Management Planned:  ? ?Additional Equipment:  ? ?Intra-op Plan:  ? ?Post-operative Plan:  ? ?Informed Consent: I have reviewed the patients History and Physical, chart, labs and discussed the procedure including the risks, benefits and alternatives for the proposed anesthesia with the patient or authorized representative who has indicated his/her understanding and acceptance.  ? ? ? ?Dental Advisory Given ? ?Plan Discussed with: CRNA ? ?Anesthesia Plan Comments:   ? ? ? ? ? ? ?Anesthesia Quick Evaluation ? ?

## 2021-08-25 NOTE — Anesthesia Procedure Notes (Signed)
Date/Time: 08/25/2021 9:27 AM ?Performed by: Cameron Ali, CRNA ?Pre-anesthesia Checklist: Patient identified, Emergency Drugs available, Suction available, Timeout performed and Patient being monitored ?Patient Re-evaluated:Patient Re-evaluated prior to induction ?Oxygen Delivery Method: Nasal cannula ?Placement Confirmation: positive ETCO2 ? ? ? ? ?

## 2021-08-25 NOTE — Transfer of Care (Signed)
Immediate Anesthesia Transfer of Care Note ? ?Patient: Nicole Tucker ? ?Procedure(s) Performed: COLONOSCOPY WITH PROPOFOL (Rectum) ? ?Patient Location: PACU ? ?Anesthesia Type: General ? ?Level of Consciousness: awake, alert  and patient cooperative ? ?Airway and Oxygen Therapy: Patient Spontanous Breathing and Patient connected to supplemental oxygen ? ?Post-op Assessment: Post-op Vital signs reviewed, Patient's Cardiovascular Status Stable, Respiratory Function Stable, Patent Airway and No signs of Nausea or vomiting ? ?Post-op Vital Signs: Reviewed and stable ? ?Complications: No notable events documented. ? ?

## 2021-08-25 NOTE — Op Note (Signed)
[facility] ?Gastroenterology ?Patient Name: Nicole Tucker ?Procedure Date: 08/25/2021 9:23 AM ?MRN: [id number] ?Account #: [id number] ?Date of Birth: [birth year] ?Admit Type: Outpatient ?Age: 76 ?Room: [facility] OR ROOM 01 ?Gender: Female ?Note Status: Finalized ?Instrument Name: Peds [id number] ?Procedure:             Colonoscopy ?Indications:           Screening for colorectal malignant neoplasm ?Providers:             Lucilla Lame MD, MD ?Referring MD:          Mikey Kirschner ?Medicines:             Propofol per Anesthesia ?Complications:         No immediate complications. ?Procedure:             Pre-Anesthesia Assessment: ?                       - Prior to the procedure, a History and Physical was  ?                       performed, and patient medications and allergies were  ?                       reviewed. The patient's tolerance of previous  ?                       anesthesia was also reviewed. The risks and benefits  ?                       of the procedure and the sedation options and risks  ?                       were discussed with the patient. All questions were  ?                       answered, and informed consent was obtained. Prior  ?                       Anticoagulants: The patient has taken no previous  ?                       anticoagulant or antiplatelet agents. ASA Grade  ?                       Assessment: II - A patient with mild systemic disease.  ?                       After reviewing the risks and benefits, the patient  ?                       was deemed in satisfactory condition to undergo the  ?                       procedure. ?                       After obtaining informed consent, the colonoscope was  ?  passed under direct vision. Throughout the procedure,  ?                       the patient's blood pressure, pulse, and oxygen  ?                       saturations were monitored continuously. The was  ?                       introduced through the  anus and advanced to the the  ?                       cecum, identified by appendiceal orifice and ileocecal  ?                       valve. The colonoscopy was performed without  ?                       difficulty. The patient tolerated the procedure well.  ?                       The quality of the bowel preparation was excellent. ?Findings: ?     The perianal and digital rectal examinations were normal. ?     There was evidence of a prior end-to-end colo-colonic anastomosis in the  ?     sigmoid colon. This was patent. ?Impression:            - Patent end-to-end colo-colonic anastomosis. ?                       - No specimens collected. ?Recommendation:        - Discharge patient to home. ?                       - Resume previous diet. ?                       - Continue present medications. ?                       - Repeat colonoscopy is not recommended for screening  ?                       purposes. ?Procedure Code(s):     --- Professional --- ?                       [phone], Colonoscopy, flexible; diagnostic, including  ?                       collection of specimen(s) by brushing or washing, when  ?                       performed (separate procedure) ?Diagnosis Code(s):     --- Professional --- ?                       Z12.11, Encounter for screening for malignant neoplasm  ?                       of colon ?CPT copyright 2019 American Medical  Association. All rights reserved. ?The codes documented in this report are preliminary and upon coder review may  ?be revised to meet current compliance requirements. ?Lucilla Lame MD, MD ?08/25/2021 9:43:52 AM ?This report has been signed electronically. ?Number of Addenda: 0 ?Note Initiated On: 08/25/2021 9:23 AM ?Scope Withdrawal Time: 0 hours 7 minutes 39 seconds  ?Total Procedure Duration: 0 hours 9 minutes 59 seconds  ?Estimated Blood Loss:  Estimated blood loss: none. ?     [facility] ?

## 2021-08-26 ENCOUNTER — Encounter: Payer: Self-pay | Admitting: Gastroenterology

## 2021-08-28 ENCOUNTER — Other Ambulatory Visit: Payer: Self-pay | Admitting: Family Medicine

## 2021-08-28 DIAGNOSIS — I10 Essential (primary) hypertension: Secondary | ICD-10-CM

## 2021-09-05 ENCOUNTER — Other Ambulatory Visit: Payer: Self-pay | Admitting: Family Medicine

## 2021-09-05 DIAGNOSIS — I10 Essential (primary) hypertension: Secondary | ICD-10-CM

## 2021-09-08 ENCOUNTER — Ambulatory Visit
Admission: RE | Admit: 2021-09-08 | Discharge: 2021-09-08 | Disposition: A | Discharge status: Home / Self Care | Payer: Medicare Other | Source: Ambulatory Visit | Attending: Physician Assistant | Admitting: Physician Assistant

## 2021-09-08 DIAGNOSIS — M85852 Other specified disorders of bone density and structure, left thigh: Secondary | ICD-10-CM | POA: Diagnosis not present

## 2021-09-09 ENCOUNTER — Other Ambulatory Visit: Payer: Self-pay | Admitting: Physician Assistant

## 2021-09-09 ENCOUNTER — Encounter: Payer: Self-pay | Admitting: Physician Assistant

## 2021-09-09 DIAGNOSIS — I10 Essential (primary) hypertension: Secondary | ICD-10-CM

## 2021-09-09 MED ORDER — LISINOPRIL 10 MG PO TABS: 10.0000 mg | ORAL_TABLET | Freq: Every day | ORAL | 1 refills | Status: DC

## 2021-09-09 MED ORDER — LISINOPRIL-HYDROCHLOROTHIAZIDE 20-25 MG PO TABS: 1.0000 | ORAL_TABLET | Freq: Every day | ORAL | 0 refills | Status: DC

## 2021-09-09 MED ORDER — LISINOPRIL-HYDROCHLOROTHIAZIDE 20-25 MG PO TABS: 1.0000 | ORAL_TABLET | Freq: Every day | ORAL | 1 refills | Status: DC

## 2021-09-11 ENCOUNTER — Other Ambulatory Visit: Payer: Self-pay

## 2021-09-11 DIAGNOSIS — I10 Essential (primary) hypertension: Secondary | ICD-10-CM

## 2021-09-11 MED ORDER — LISINOPRIL 10 MG PO TABS: 10.0000 mg | ORAL_TABLET | Freq: Every day | ORAL | 1 refills | Status: DC

## 2021-09-18 ENCOUNTER — Other Ambulatory Visit: Payer: Self-pay

## 2021-09-18 DIAGNOSIS — I10 Essential (primary) hypertension: Secondary | ICD-10-CM

## 2021-09-18 MED ORDER — LISINOPRIL 10 MG PO TABS: 10.0000 mg | ORAL_TABLET | Freq: Every day | ORAL | 1 refills | Status: DC

## 2021-09-18 MED ORDER — LISINOPRIL-HYDROCHLOROTHIAZIDE 20-25 MG PO TABS: 1.0000 | ORAL_TABLET | Freq: Every day | ORAL | 1 refills | Status: DC

## 2021-10-21 ENCOUNTER — Ambulatory Visit: Payer: Medicare Other | Admitting: Physician Assistant

## 2021-11-11 ENCOUNTER — Ambulatory Visit: Payer: Medicare Other | Admitting: Physician Assistant

## 2021-11-18 ENCOUNTER — Encounter: Payer: Self-pay | Admitting: Gastroenterology

## 2021-12-01 DIAGNOSIS — R070 Pain in throat: Secondary | ICD-10-CM | POA: Diagnosis not present

## 2021-12-01 DIAGNOSIS — J029 Acute pharyngitis, unspecified: Secondary | ICD-10-CM | POA: Diagnosis not present

## 2022-01-06 ENCOUNTER — Encounter: Payer: Self-pay | Admitting: Physician Assistant

## 2022-01-06 ENCOUNTER — Ambulatory Visit: Payer: Medicare Other | Admitting: Physician Assistant

## 2022-01-06 VITALS — BP 158/64 | HR 50 | Temp 97.7°F | Wt 138.0 lb

## 2022-01-06 DIAGNOSIS — E785 Hyperlipidemia, unspecified: Secondary | ICD-10-CM | POA: Diagnosis not present

## 2022-01-06 DIAGNOSIS — E1169 Type 2 diabetes mellitus with other specified complication: Secondary | ICD-10-CM | POA: Diagnosis not present

## 2022-01-06 DIAGNOSIS — E119 Type 2 diabetes mellitus without complications: Secondary | ICD-10-CM | POA: Diagnosis not present

## 2022-01-06 DIAGNOSIS — Z1231 Encounter for screening mammogram for malignant neoplasm of breast: Secondary | ICD-10-CM

## 2022-01-06 DIAGNOSIS — I6523 Occlusion and stenosis of bilateral carotid arteries: Secondary | ICD-10-CM

## 2022-01-06 DIAGNOSIS — I10 Essential (primary) hypertension: Secondary | ICD-10-CM | POA: Diagnosis not present

## 2022-01-06 DIAGNOSIS — Z0000 Encounter for general adult medical examination without abnormal findings: Secondary | ICD-10-CM | POA: Diagnosis not present

## 2022-01-06 NOTE — Progress Notes (Signed)
[place] Ponder [place],acting as a scribe for Mikey Kirschner, PA-C.,have documented all relevant documentation on the behalf of Mikey Kirschner, PA-C,as directed by  Mikey Kirschner, PA-C while in the presence of Mikey Kirschner, PA-C.    Annual Wellness Visit     Patient: Nicole Tucker, Female    DOB: 03/07/1946, 76 y.o.   MRN: [id number] Visit Date: 01/06/2022  Today's Provider: Mikey Kirschner, PA-C   Cc. awv  Subjective    Nicole Tucker is a 76 y.o. female who presents today for her Annual Wellness Visit.  Medications: Outpatient Medications Prior to Visit  Medication Sig   Ascorbic Acid (VITAMIN C PO) Take by mouth daily.   B Complex Vitamins (VITAMIN B COMPLEX PO) Take by mouth daily.   Barberry-Oreg Grape-Goldenseal (BERBERINE COMPLEX PO) Take by mouth.   BIOTIN PO Take by mouth.   Calcium Carb-Cholecalciferol 333-3.325 MG-MCG TABS Take by mouth.   Cholecalciferol (VITAMIN D) 2000 units CAPS Take by mouth daily.    COLLAGEN PO Take by mouth.   Garlic 2993 MG CAPS Take 3,000 mg by mouth.   Iron Combinations (IRON COMPLEX PO) Take by mouth.   lisinopril (ZESTRIL) 10 MG tablet Take 1 tablet (10 mg total) by mouth daily. Please schedule office visit before any future refill.   lisinopril-hydrochlorothiazide (ZESTORETIC) 20-25 MG tablet Take 1 tablet by mouth daily.   Multiple Vitamin (MULTIVITAMIN WITH MINERALS) TABS tablet Take 1 tablet by mouth daily.   Omega-3 Fatty Acids (FISH OIL ODOR-LESS PO) Take by mouth.   VITAMIN A PO Take by mouth.   ZINC PICOLINATE PO Take by mouth.   No facility-administered medications prior to visit.    Allergies  Allergen Reactions   Codeine Itching and Anxiety    Other reaction(s): Other (See Comments) Nervous    Patient Care Team: Mikey Kirschner, PA-C as PCP - General (Physician Assistant) Delana Meyer, Dolores Lory, MD as Consulting Physician (Vascular Surgery) Oneta Rack, MD as Consulting Physician (Dermatology) Pa, [place] as Consulting Physician  Review of Systems  Constitutional: Negative.   HENT: Negative.    Eyes: Negative.   Respiratory: Negative.    Cardiovascular: Negative.   Gastrointestinal: Negative.   Endocrine: Negative.   Genitourinary: Negative.   Musculoskeletal: Negative.   Skin: Negative.   Allergic/Immunologic: Negative.   Neurological:  Positive for numbness (bilateral little toes).  Hematological: Negative.   Psychiatric/Behavioral: Negative.        Objective    Vitals: BP (!) 158/64 (BP Location: Right Arm, Patient Position: Sitting, Cuff Size: Normal)   Pulse (!) 50   Temp 97.7 F (36.5 C) (Oral)   Wt 138 lb (62.6 kg)   SpO2 100%   BMI 24.45 kg/m  Vitals:   01/06/22 1042 01/06/22 1044  BP: (!) 164/61 (!) 158/64  Pulse: (!) 50   Temp: 97.7 F (36.5 C)   TempSrc: Oral   SpO2: 100%   Weight: 138 lb (62.6 kg)       Physical Exam Constitutional:      General: She is awake.     Appearance: She is well-developed. She is not ill-appearing.  HENT:     Head: Normocephalic.     Right Ear: Tympanic membrane normal.     Left Ear: Tympanic membrane normal.     Nose: Nose normal. No congestion or rhinorrhea.     Mouth/Throat:     Pharynx: No oropharyngeal exudate or posterior oropharyngeal erythema.  Eyes:     Conjunctiva/sclera: Conjunctivae normal.  Pupils: Pupils are equal, round, and reactive to light.  Neck:     Thyroid: No thyroid mass or thyromegaly.  Cardiovascular:     Rate and Rhythm: Normal rate and regular rhythm.     Pulses:          Dorsalis pedis pulses are 3+ on the right side and 3+ on the left side.       Posterior tibial pulses are 3+ on the right side and 3+ on the left side.     Heart sounds: Normal heart sounds.  Pulmonary:     Effort: Pulmonary effort is normal.     Breath sounds: Normal breath sounds.  Abdominal:     Palpations: Abdomen is soft.     Tenderness: There is no abdominal tenderness.  Musculoskeletal:     Right lower  leg: No swelling. No edema.     Left lower leg: No swelling. No edema.  Feet:     Right foot:     Protective Sensation: 4 sites tested.  4 sites sensed.     Skin integrity: Skin integrity normal.     Toenail Condition: Right toenails are normal.     Left foot:     Protective Sensation: 4 sites tested.  4 sites sensed.     Skin integrity: Skin integrity normal.     Toenail Condition: Left toenails are normal.     Comments: Sensation intact to monofilament to b/l pinky toes  Lymphadenopathy:     Cervical: No cervical adenopathy.  Skin:    General: Skin is warm.  Neurological:     Mental Status: She is alert and oriented to person, place, and time.  Psychiatric:        Attention and Perception: Attention normal.        Mood and Affect: Mood normal.        Speech: Speech normal.        Behavior: Behavior normal. Behavior is cooperative.     Most recent functional status assessment:    01/06/2022   10:44 AM  In your present state of health, do you have any difficulty performing the following activities:  Hearing? 0  Vision? 0  Difficulty concentrating or making decisions? 0  Walking or climbing stairs? 0  Dressing or bathing? 0  Doing errands, shopping? 0   Most recent fall risk assessment:    01/06/2022   10:43 AM  Fall Risk   Falls in the past year? 0    Most recent depression screenings:    01/06/2022   10:43 AM 07/22/2021   10:31 AM  PHQ 2/9 Scores  PHQ - 2 Score 0 0  PHQ- 9 Score 2 0   Most recent cognitive screening:    10/27/2016    8:59 AM  6CIT Screen  What Year? 0 points  What month? 0 points  What time? 0 points  Count back from 20 0 points  Months in reverse 0 points  Repeat phrase 4 points  Total Score 4 points   Most recent Audit-C alcohol use screening    01/06/2022   10:43 AM  Alcohol Use Disorder Test (AUDIT)  1. How often do you have a drink containing alcohol? 3  2. How many drinks containing alcohol do you have on a typical day when you  are drinking? 1  3. How often do you have six or more drinks on one occasion? 0  AUDIT-C Score 4   A score of 3 or more in women,  and 4 or more in men indicates increased risk for alcohol abuse, EXCEPT if all of the points are from question 1   No results found for any visits on 01/06/22.  Assessment & Plan     Annual wellness visit done today including the all of the following: Reviewed patient's Family Medical History Reviewed and updated list of patient's medical providers Assessment of cognitive impairment was done Assessed patient's functional ability Established a written schedule for health screening Ewa Gentry Completed and Reviewed  Exercise Activities and Dietary recommendations  Goals      DIET - EAT MORE FRUITS AND VEGETABLES     Exercise 3x per week (30 min per time)     Recommend increasing exercise to walking 3 days a week for 30 minutes.        Immunization History  Administered Date(s) Administered   Pneumococcal Conjugate-13 09/10/2015   Pneumococcal Polysaccharide-23 10/27/2016   Tdap 10/14/2009    Health Maintenance  Topic Date Due   COVID-19 Vaccine (1) Never done   Zoster Vaccines- Shingrix (1 of 2) Never done   OPHTHALMOLOGY EXAM  05/13/2016   MAMMOGRAM  12/25/2021   INFLUENZA VACCINE  12/23/2021   TETANUS/TDAP  07/22/2022 (Originally 10/15/2019)   HEMOGLOBIN A1C  01/19/2022   FOOT EXAM  01/07/2023   DEXA SCAN  09/09/2026   COLONOSCOPY (Pts 45-28yr Insurance coverage will need to be confirmed)  08/26/2031   Pneumonia Vaccine 76 Years old  Completed   Hepatitis C Screening  Completed   HPV VACCINES  Aged Out     Discussed health benefits of physical activity, and encouraged her to engage in regular exercise appropriate for her age and condition.    Problem List Items Addressed This Visit       Cardiovascular and Mediastinum   Benign essential HTN    Currently manages with lisinopril 30 mg and hctz 25 mg  Element  of white coat HTN, elevated in office but moderately controlled at home Concerned for orthostatic hypotension if increase lisinopril -- previously on amlodipine 5 mg unsure why d/c reports a HTN med made her cough but tolerates lisinopril w/o issue Advised pt to check at home and record for the next 6-8 weeks and f/u in office to see her home average       Relevant Orders   Comprehensive Metabolic Panel (CMET)   Bilateral carotid artery stenosis    Follows with vascular last ultrasound 11/22 b/l < 50%        Endocrine   Diabetes mellitus without complication (HCC)    Last A1c 5.3% will order today but has consistently <6.4% for 1 year after 40 pound weight loss and lifestyle changes Unmedicated.  Consider statin see HLD note Reminded optho , uacr utd Foot exam today F/u annually if a1c in normal range        Relevant Orders   HgB A1c   Hyperlipidemia associated with type 2 diabetes mellitus (HSection    The 10-year ASCVD risk score (Arnett DK, et al., 2019) is: 36.1% Previously declined statin will recheck fasting lipids  Encouraged statin for cardiovascular protection      Relevant Orders   Comprehensive Metabolic Panel (CMET)   Lipid Profile   Other Visit Diagnoses     Encounter for Medicare annual wellness exam    -  Primary   Encounter for screening mammogram for malignant neoplasm of breast       Relevant Orders   MM 3D SCREEN BREAST  BILATERAL        Return in about 6 weeks (around 02/17/2022) for hypertension.     I, Mikey Kirschner, PA-C have reviewed all documentation for this visit. The documentation on  01/06/2022  for the exam, diagnosis, procedures, and orders are all accurate and complete.  Mikey Kirschner, PA-C [facility] [address] [place], [place], [address] Office: [phone] Fax: [place]

## 2022-01-06 NOTE — Assessment & Plan Note (Signed)
The 10-year ASCVD risk score (Arnett DK, et al., 2019) is: 36.1% Previously declined statin will recheck fasting lipids  Encouraged statin for cardiovascular protection

## 2022-01-06 NOTE — Assessment & Plan Note (Signed)
Follows with vascular last ultrasound 11/22 b/l < 50%

## 2022-01-06 NOTE — Assessment & Plan Note (Addendum)
Currently manages with lisinopril 30 mg and hctz 25 mg  Element of white coat HTN, elevated in office but moderately controlled at home Concerned for orthostatic hypotension if increase lisinopril -- previously on amlodipine 5 mg unsure why d/c reports a HTN med made her cough but tolerates lisinopril w/o issue Advised pt to check at home and record for the next 6-8 weeks and f/u in office to see her home average

## 2022-01-06 NOTE — Assessment & Plan Note (Addendum)
Last A1c 5.3% will order today but has consistently <6.4% for 1 year after 40 pound weight loss and lifestyle changes Unmedicated.  Consider statin see HLD note Reminded optho , uacr utd Foot exam today F/u annually if a1c in normal range

## 2022-01-07 ENCOUNTER — Encounter: Payer: Self-pay | Admitting: Physician Assistant

## 2022-01-07 DIAGNOSIS — I10 Essential (primary) hypertension: Secondary | ICD-10-CM

## 2022-01-07 LAB — LIPID PANEL
Chol/HDL Ratio: 3.2 ratio (ref 0.0–4.4)
Cholesterol, Total: 226 mg/dL — ABNORMAL HIGH (ref 100–199)
HDL: 71 mg/dL (ref >39)
LDL Chol Calc (NIH): 134 mg/dL — ABNORMAL HIGH (ref 0–99)
Triglycerides: 122 mg/dL (ref 0–149)
VLDL Cholesterol Cal: 21 mg/dL (ref 5–40)

## 2022-01-07 LAB — COMPREHENSIVE METABOLIC PANEL
ALT: 17 IU/L (ref 0–32)
AST: 21 IU/L (ref 0–40)
Albumin/Globulin Ratio: 1.5 (ref 1.2–2.2)
Albumin: 4.4 g/dL (ref 3.8–4.8)
Alkaline Phosphatase: 64 IU/L (ref 44–121)
BUN/Creatinine Ratio: 20 (ref 12–28)
BUN: 17 mg/dL (ref 8–27)
Bilirubin Total: 1.2 mg/dL (ref 0.0–1.2)
CO2: 27 mmol/L (ref 20–29)
Calcium: 10.2 mg/dL (ref 8.7–10.3)
Chloride: 100 mmol/L (ref 96–106)
Creatinine, Ser: 0.87 mg/dL (ref 0.57–1.00)
Globulin, Total: 3 g/dL (ref 1.5–4.5)
Glucose: 97 mg/dL (ref 70–99)
Potassium: 4.9 mmol/L (ref 3.5–5.2)
Sodium: 142 mmol/L (ref 134–144)
Total Protein: 7.4 g/dL (ref 6.0–8.5)
eGFR: 70 mL/min/{1.73_m2} (ref >59)

## 2022-01-07 LAB — HEMOGLOBIN A1C
Est. average glucose Bld gHb Est-mCnc: 111 mg/dL
Hgb A1c MFr Bld: 5.5 % (ref 4.8–5.6)

## 2022-01-07 MED ORDER — LISINOPRIL-HYDROCHLOROTHIAZIDE 20-25 MG PO TABS: 1.0000 | ORAL_TABLET | Freq: Every day | ORAL | 1 refills | Status: DC

## 2022-01-07 MED ORDER — ROSUVASTATIN CALCIUM 5 MG PO TABS: 5.0000 mg | ORAL_TABLET | Freq: Every day | ORAL | 3 refills | Status: DC

## 2022-01-07 MED ORDER — LISINOPRIL 10 MG PO TABS: 10.0000 mg | ORAL_TABLET | Freq: Every day | ORAL | 1 refills | Status: DC

## 2022-01-21 ENCOUNTER — Emergency Department
Admission: EM | Admit: 2022-01-21 | Discharge: 2022-01-21 | Discharge status: Absent without leave | Payer: Medicare Other

## 2022-02-02 ENCOUNTER — Emergency Department
Admission: EM | Admit: 2022-02-02 | Discharge: 2022-02-02 | Disposition: A | Discharge status: Home / Self Care | Payer: Medicare Other | Attending: Emergency Medicine | Admitting: Emergency Medicine

## 2022-02-02 ENCOUNTER — Emergency Department: Payer: Medicare Other

## 2022-02-02 ENCOUNTER — Other Ambulatory Visit: Payer: Self-pay

## 2022-02-02 DIAGNOSIS — W19XXXA Unspecified fall, initial encounter: Secondary | ICD-10-CM | POA: Diagnosis not present

## 2022-02-02 DIAGNOSIS — E119 Type 2 diabetes mellitus without complications: Secondary | ICD-10-CM | POA: Diagnosis not present

## 2022-02-02 DIAGNOSIS — R42 Dizziness and giddiness: Secondary | ICD-10-CM | POA: Diagnosis not present

## 2022-02-02 DIAGNOSIS — I10 Essential (primary) hypertension: Secondary | ICD-10-CM | POA: Diagnosis not present

## 2022-02-02 DIAGNOSIS — R609 Edema, unspecified: Secondary | ICD-10-CM | POA: Diagnosis not present

## 2022-02-02 DIAGNOSIS — R600 Localized edema: Secondary | ICD-10-CM | POA: Diagnosis not present

## 2022-02-02 DIAGNOSIS — S4991XA Unspecified injury of right shoulder and upper arm, initial encounter: Secondary | ICD-10-CM | POA: Diagnosis present

## 2022-02-02 DIAGNOSIS — W1839XA Other fall on same level, initial encounter: Secondary | ICD-10-CM | POA: Insufficient documentation

## 2022-02-02 DIAGNOSIS — M7989 Other specified soft tissue disorders: Secondary | ICD-10-CM | POA: Diagnosis not present

## 2022-02-02 DIAGNOSIS — S42201A Unspecified fracture of upper end of right humerus, initial encounter for closed fracture: Secondary | ICD-10-CM | POA: Diagnosis not present

## 2022-02-02 DIAGNOSIS — S42291A Other displaced fracture of upper end of right humerus, initial encounter for closed fracture: Secondary | ICD-10-CM | POA: Diagnosis not present

## 2022-02-02 LAB — BASIC METABOLIC PANEL
Anion gap: 14 (ref 5–15)
BUN: 15 mg/dL (ref 8–23)
CO2: 22 mmol/L (ref 22–32)
Calcium: 9.2 mg/dL (ref 8.9–10.3)
Chloride: 100 mmol/L (ref 98–111)
Creatinine, Ser: 0.61 mg/dL (ref 0.44–1.00)
GFR, Estimated: >60 mL/min (ref >60)
Glucose, Bld: 96 mg/dL (ref 70–99)
Potassium: 3.3 mmol/L — ABNORMAL LOW (ref 3.5–5.1)
Sodium: 136 mmol/L (ref 135–145)

## 2022-02-02 LAB — CBC WITH DIFFERENTIAL/PLATELET
Abs Immature Granulocytes: 0.09 10*3/uL — ABNORMAL HIGH (ref 0.00–0.07)
Basophils Absolute: 0.1 10*3/uL (ref 0.0–0.1)
Basophils Relative: 0 %
Eosinophils Absolute: 0 10*3/uL (ref 0.0–0.5)
Eosinophils Relative: 0 %
HCT: 43.6 % (ref 36.0–46.0)
Hemoglobin: 15.3 g/dL — ABNORMAL HIGH (ref 12.0–15.0)
Immature Granulocytes: 1 %
Lymphocytes Relative: 11 %
Lymphs Abs: 1.7 10*3/uL (ref 0.7–4.0)
MCH: 34 pg (ref 26.0–34.0)
MCHC: 35.1 g/dL (ref 30.0–36.0)
MCV: 96.9 fL (ref 80.0–100.0)
Monocytes Absolute: 1.6 10*3/uL — ABNORMAL HIGH (ref 0.1–1.0)
Monocytes Relative: 11 %
Neutro Abs: 11.1 10*3/uL — ABNORMAL HIGH (ref 1.7–7.7)
Neutrophils Relative %: 77 %
Platelets: 220 10*3/uL (ref 150–400)
RBC: 4.5 MIL/uL (ref 3.87–5.11)
RDW: 12.6 % (ref 11.5–15.5)
WBC: 14.6 10*3/uL — ABNORMAL HIGH (ref 4.0–10.5)
nRBC: 0 % (ref 0.0–0.2)

## 2022-02-02 MED ORDER — SODIUM CHLORIDE 0.9 % IV BOLUS
1000.0000 mL | Freq: Once | INTRAVENOUS | Status: AC
  Administered 2022-02-02: 1000 mL via INTRAVENOUS

## 2022-02-02 MED ORDER — MORPHINE SULFATE (PF) 4 MG/ML IV SOLN
4.0000 mg | Freq: Once | INTRAVENOUS | Status: AC
  Administered 2022-02-02: 4 mg via INTRAVENOUS
  Filled 2022-02-02: qty 1

## 2022-02-02 MED ORDER — ONDANSETRON HCL 4 MG/2ML IJ SOLN
INTRAMUSCULAR | Status: AC
  Administered 2022-02-02: 4 mg
  Filled 2022-02-02: qty 2

## 2022-02-02 MED ORDER — FENTANYL CITRATE PF 50 MCG/ML IJ SOSY
50.0000 ug | PREFILLED_SYRINGE | Freq: Once | INTRAMUSCULAR | Status: AC
  Administered 2022-02-02: 50 ug via INTRAVENOUS
  Filled 2022-02-02: qty 1

## 2022-02-02 MED ORDER — OXYCODONE HCL 5 MG PO TABS: 5.0000 mg | ORAL_TABLET | Freq: Four times a day (QID) | ORAL | 0 refills | Status: AC | PRN

## 2022-02-02 MED ORDER — KETOROLAC TROMETHAMINE 15 MG/ML IJ SOLN
15.0000 mg | Freq: Once | INTRAMUSCULAR | Status: AC
  Administered 2022-02-02: 15 mg via INTRAVENOUS
  Filled 2022-02-02: qty 1

## 2022-02-02 MED ORDER — ONDANSETRON HCL 4 MG/2ML IJ SOLN: 4.0000 mg | Freq: Once | INTRAMUSCULAR | Status: AC

## 2022-02-02 NOTE — ED Provider Notes (Signed)
[facility] Provider Note    Event Date/Time   First MD Initiated Contact with Patient 02/02/22 [phone]     (approximate)   History   Chief Complaint: Fall   HPI  Nicole Tucker is a 76 y.o. female with a history of hypertension, diabetes who comes ED complaining of right shoulder pain after fall.  She was in her usual state of health until yesterday afternoon when she was getting out of a car in the driveway, tripped on some gravel, and fell onto her right arm.  She had right shoulder pain ever since that fall which was worse with movement.  No other injuries or complaints at that time.  Then this morning, she went to the bathroom could not use her right arm to lower herself to the toilet so she decided to urinate in the bathtub instead.  Moving around to achieve this was so painful that after urinating she got lightheaded and then passed out.  Her roommate reported that they were there and caught her and lowered her safely to the ground.  Patient denies any headache or neck pain.  No other complaints.     Physical Exam   Triage Vital Signs: ED Triage Vitals  Enc Vitals Group     BP 02/02/22 0635 (!) 195/89     Pulse Rate 02/02/22 0635 (!) 59     Resp 02/02/22 0635 18     Temp 02/02/22 0635 98.9 F (37.2 C)     Temp Source 02/02/22 0635 Oral     SpO2 02/02/22 0635 99 %     Weight 02/02/22 0637 140 lb (63.5 kg)     Height 02/02/22 0637 '5\' 3"'$  (1.6 m)     Head Circumference --      Peak Flow --      Pain Score 02/02/22 0636 4     Pain Loc --      Pain Edu? --      Excl. in [place]? --     Most recent vital signs: Vitals:   02/02/22 0800 02/02/22 0900  BP: (!) 158/67 (!) 153/52  Pulse: (!) 57 61  Resp: 17 15  Temp:  98.2 F (36.8 C)  SpO2: 98% 98%    General: Awake, no distress.  CV:  Good peripheral perfusion.  Normal radial pulses. Resp:  Normal effort.  Clear to auscultation bilaterally Abd:  No distention.  Soft nontender Other:  Head  atraumatic.  C-spine nontender.  Full range of motion of the C-spine. There is tenderness and swelling at the proximal right humerus.  Clavicles are stable.  Humeral diaphysis is not on tender, no deformity.  There is some ecchymosis and tenderness at the distal humerus as well.  Elbow joint and distal arm are normal.   ED Results / Procedures / Treatments   Labs (all labs ordered are listed, but only abnormal results are displayed) Labs Reviewed  BASIC METABOLIC PANEL - Abnormal; Notable for the following components:      Result Value   Potassium 3.3 (*)    All other components within normal limits  CBC WITH DIFFERENTIAL/PLATELET - Abnormal; Notable for the following components:   WBC 14.6 (*)    Hemoglobin 15.3 (*)    Neutro Abs 11.1 (*)    Monocytes Absolute 1.6 (*)    Abs Immature Granulocytes 0.09 (*)    All other components within normal limits     EKG Interpreted by me Sinus rhythm rate of 56.  Normal axis, normal intervals.  Normal QRS ST segments and T waves.  No ischemic changes.   RADIOLOGY X-ray right humerus interpreted by me and shows proximal humerus fracture.  Radiology report reviewed.   PROCEDURES:  Procedures   MEDICATIONS ORDERED IN ED: Medications  fentaNYL (SUBLIMAZE) injection 50 mcg (50 mcg Intravenous Given 02/02/22 0745)  sodium chloride 0.9 % bolus 1,000 mL (0 mLs Intravenous Stopped 02/02/22 0855)  ketorolac (TORADOL) 15 MG/ML injection 15 mg (15 mg Intravenous Given 02/02/22 0857)  morphine (PF) 4 MG/ML injection 4 mg (4 mg Intravenous Given 02/02/22 0856)  ondansetron (ZOFRAN) injection 4 mg (4 mg Intravenous Given 02/02/22 0857)     IMPRESSION / MDM / ASSESSMENT AND PLAN / ED COURSE  I reviewed the triage vital signs and the nursing notes.                              Differential diagnosis includes, but is not limited to, humerus fracture, shoulder dislocation, dehydration, electrolyte malady  Patient's presentation is most consistent  with acute presentation with potential threat to life or bodily function.  Patient presents with syncope which I think is vagal related to pain and micturition in the setting of right arm injury which is found to be a proximal humerus fracture.  Discussed with Dr. Sharlet Salina of orthopedics, and agrees with sling and outpatient follow-up.  Discussed pain control with the patient.  She is not in favor of managing medications at home and will stick to ibuprofen and Tylenol.       FINAL CLINICAL IMPRESSION(S) / ED DIAGNOSES   Final diagnoses:  Other closed displaced fracture of proximal end of right humerus, initial encounter     Rx / DC Orders   ED Discharge Orders     None        Note:  This document was prepared using Dragon voice recognition software and may include unintentional dictation errors.   Carrie Mew, MD 02/02/22 [phone]

## 2022-02-02 NOTE — ED Triage Notes (Addendum)
Pt fell in the driveway last night at 4pm and was helped back into bed. Pt than fell this morning when she got dizzy. Pt having tight shoulder pain with swelling

## 2022-02-02 NOTE — Discharge Instructions (Signed)
Take ibuprofen '600mg'$  every 6 hours and tylenol '1000mg'$  every 8 hours for pain control.

## 2022-02-02 NOTE — ED Notes (Signed)
[facility] [address]

## 2022-02-05 DIAGNOSIS — S42211A Unspecified displaced fracture of surgical neck of right humerus, initial encounter for closed fracture: Secondary | ICD-10-CM | POA: Diagnosis not present

## 2022-02-26 DIAGNOSIS — S42211A Unspecified displaced fracture of surgical neck of right humerus, initial encounter for closed fracture: Secondary | ICD-10-CM | POA: Diagnosis not present

## 2022-03-10 ENCOUNTER — Ambulatory Visit: Payer: Medicare Other | Admitting: Physician Assistant

## 2022-03-23 ENCOUNTER — Encounter (INDEPENDENT_AMBULATORY_CARE_PROVIDER_SITE_OTHER): Payer: Self-pay

## 2022-03-25 DIAGNOSIS — S42211A Unspecified displaced fracture of surgical neck of right humerus, initial encounter for closed fracture: Secondary | ICD-10-CM | POA: Diagnosis not present

## 2022-04-01 ENCOUNTER — Encounter: Payer: Self-pay | Admitting: Physician Assistant

## 2022-04-01 ENCOUNTER — Ambulatory Visit (INDEPENDENT_AMBULATORY_CARE_PROVIDER_SITE_OTHER): Payer: Medicare Other | Admitting: Physician Assistant

## 2022-04-01 VITALS — BP 113/72 | HR 72 | Temp 97.9°F | Resp 14 | Wt 141.0 lb

## 2022-04-01 DIAGNOSIS — E1159 Type 2 diabetes mellitus with other circulatory complications: Secondary | ICD-10-CM

## 2022-04-01 DIAGNOSIS — I152 Hypertension secondary to endocrine disorders: Secondary | ICD-10-CM

## 2022-04-01 DIAGNOSIS — R202 Paresthesia of skin: Secondary | ICD-10-CM

## 2022-04-01 NOTE — Assessment & Plan Note (Addendum)
Again elevated in office-- white coat HTN Managed with lisinopril 30 mg and hctz 25 mg  At home reviewed values from sept, October, all within appropriate range. Encouraged pt to continue to check sporadically. F/u 6 mo

## 2022-04-01 NOTE — Progress Notes (Signed)
I,Roshena L Chambers,acting as a scribe for [employer], PA-C.,have documented all relevant documentation on the behalf of Mikey Kirschner, PA-C,as directed by  Mikey Kirschner, PA-C while in the presence of Mikey Kirschner, PA-C.   Established patient visit   Patient: Nicole Tucker   DOB: 1945/08/09   76 y.o. Female  MRN: [id number] Visit Date: 04/01/2022  Today's healthcare provider: Mikey Kirschner, PA-C   Chief Complaint  Patient presents with   Hyperlipidemia   Hypertension   Subjective    HPI  Hypertension, follow-up  BP Readings from Last 3 Encounters:  04/01/22 113/72  02/02/22 (!) 156/66  01/06/22 (!) 158/64   Wt Readings from Last 3 Encounters:  04/01/22 141 lb (64 kg)  02/02/22 140 lb (63.5 kg)  01/06/22 138 lb (62.6 kg)     She was last seen for hypertension 8 months ago.  BP at that visit was 134/70. Management since that visit includes advising patient to continue to monitor and check blood pressure at home 1-2 times a week.  If systolic consistently > 324 will need to reconsider meds.  She reports good compliance with treatment. She is not having side effects.  She is following a Regular diet. She is not exercising. She does not smoke.  Use of agents associated with hypertension: none.   Outside blood pressures are checked and average 125/70's.  Pertinent labs Lab Results  Component Value Date   CHOL 226 (H) 01/06/2022   HDL 71 01/06/2022   LDLCALC 134 (H) 01/06/2022   TRIG 122 01/06/2022   CHOLHDL 3.2 01/06/2022   Lab Results  Component Value Date   NA 136 02/02/2022   K 3.3 (L) 02/02/2022   CREATININE 0.61 02/02/2022   GFRNONAA >60 02/02/2022   GLUCOSE 96 02/02/2022   TSH 3.070 11/22/2020     The 10-year ASCVD risk score (Arnett DK, et al., 2019) is: 27.5%  ---------------------------------------------------------------------------------------------------   Lipid/Cholesterol, Follow-up  Last lipid panel Other pertinent labs  Lab  Results  Component Value Date   CHOL 226 (H) 01/06/2022   HDL 71 01/06/2022   LDLCALC 134 (H) 01/06/2022   TRIG 122 01/06/2022   CHOLHDL 3.2 01/06/2022   Lab Results  Component Value Date   ALT 17 01/06/2022   AST 21 01/06/2022   PLT 220 02/02/2022   TSH 3.070 11/22/2020     She was last seen for this on 01/06/2022.   Management since that visit includes starting Crestor '5mg'$  daily.  She reports fair compliance with treatment (has been taking Crestor every other day). She is not having side effects.   Symptoms: No chest pain No chest pressure/discomfort  No dyspnea No lower extremity edema  No numbness or tingling of extremity No orthopnea  No palpitations No paroxysmal nocturnal dyspnea  No speech difficulty No syncope   Current diet: well balanced Current exercise: none  The 10-year ASCVD risk score (Arnett DK, et al., 2019) is: 27.5%  ---------------------------------------------------------------------------------------------------  Pt also reports for the last month, a numbness and burning sensation to her lateral left thigh that comes and goes. She denies rash, injury to back, history of back surgery.   Medications: Outpatient Medications Prior to Visit  Medication Sig Note   Ascorbic Acid (VITAMIN C PO) Take by mouth daily.    B Complex Vitamins (VITAMIN B COMPLEX PO) Take by mouth daily.    BIOTIN PO Take by mouth.    Calcium Carb-Cholecalciferol 333-3.325 MG-MCG TABS Take by mouth.    Cholecalciferol (  VITAMIN D) 2000 units CAPS Take by mouth daily.     COLLAGEN PO Take by mouth.    Garlic 1610 MG CAPS Take 3,000 mg by mouth.    Iron Combinations (IRON COMPLEX PO) Take by mouth.    lisinopril (ZESTRIL) 10 MG tablet Take 1 tablet (10 mg total) by mouth daily. Please schedule office visit before any future refill.    lisinopril-hydrochlorothiazide (ZESTORETIC) 20-25 MG tablet Take 1 tablet by mouth daily.    Multiple Vitamin (MULTIVITAMIN WITH MINERALS) TABS  tablet Take 1 tablet by mouth daily.    Omega-3 Fatty Acids (FISH OIL ODOR-LESS PO) Take by mouth.    rosuvastatin (CRESTOR) 5 MG tablet Take 1 tablet (5 mg total) by mouth daily. 04/01/2022: Taking every other day   VITAMIN A PO Take by mouth.    ZINC PICOLINATE PO Take by mouth.    [DISCONTINUED] Barberry-Oreg Grape-Goldenseal (BERBERINE COMPLEX PO) Take by mouth. (Patient not taking: Reported on 04/01/2022)    No facility-administered medications prior to visit.    Review of Systems  Constitutional:  Negative for appetite change, chills, fatigue and fever.  Respiratory:  Negative for chest tightness and shortness of breath.   Cardiovascular:  Negative for chest pain and palpitations.  Gastrointestinal:  Negative for abdominal pain, nausea and vomiting.  Neurological:  Negative for dizziness and weakness.     Objective    BP 113/72 Comment: home value 03/06/2022  Pulse 72   Temp 97.9 F (36.6 C) (Oral)   Resp 14   Wt 141 lb (64 kg)   SpO2 100% Comment: room air  BMI 24.98 kg/m   Physical Exam Vitals reviewed.  Constitutional:      Appearance: She is not ill-appearing.  HENT:     Head: Normocephalic.  Eyes:     Conjunctiva/sclera: Conjunctivae normal.  Cardiovascular:     Rate and Rhythm: Normal rate.  Pulmonary:     Effort: Pulmonary effort is normal. No respiratory distress.  Neurological:     General: No focal deficit present.     Mental Status: She is alert and oriented to person, place, and time.  Psychiatric:        Mood and Affect: Mood normal.        Behavior: Behavior normal.     No results found for any visits on 04/01/22.  Assessment & Plan     Problem List Items Addressed This Visit       Cardiovascular and Mediastinum   Hypertension associated with diabetes ([place]) - Primary    Again elevated in office-- white coat HTN Managed with lisinopril 30 mg and hctz 25 mg  At home reviewed values from sept, October, all within appropriate  range. Encouraged pt to continue to check sporadically. F/u 6 mo         Other   Leg paresthesia    Advised likely referred pain spinal in etiology, no history. Advised pt to monitor encouraged exercise, stretching If progresses we can w/u       Return in about 5 months (around 08/31/2022) for AVW.      I, Mikey Kirschner, PA-C have reviewed all documentation for this visit. The documentation on  04/01/2022  for the exam, diagnosis, procedures, and orders are all accurate and complete.  Mikey Kirschner, PA-C [facility] [address] [place], [place], [address] Office: [phone] Fax: [place]

## 2022-04-01 NOTE — Assessment & Plan Note (Addendum)
Advised likely referred pain spinal in etiology, no history. Advised pt to monitor encouraged exercise, stretching If progresses we can w/u

## 2022-04-09 NOTE — Progress Notes (Signed)
MRN : [id number]  Nicole Tucker is a 76 y.o. (Jan 08, 1946) female who presents with chief complaint of check carotid arteries.  History of Present Illness:   The patient is seen for follow up evaluation of carotid stenosis. The carotid stenosis followed by ultrasound.    The patient denies amaurosis fugax. There is no recent history of TIA symptoms or focal motor deficits. There is no prior documented CVA.   The patient is taking enteric-coated aspirin 81 mg daily.   There is no history of migraine headaches. There is no history of seizures.   The patient has a history of coronary artery disease, no recent episodes of angina or shortness of breath. The patient denies PAD or claudication symptoms. There is a history of hyperlipidemia which is being treated with a statin.     Carotid Duplex done today shows RICA 6-31% and LICA 4-97% (high grade left external stenosis).  Perhaps slight improvement compared to last study dated 10/13/2021.  No outpatient medications have been marked as taking for the 04/13/22 encounter (Appointment) with Delana Meyer, Dolores Lory, MD.    Past Medical History:  Diagnosis Date   Arthritis    Diabetes mellitus without complication ([place])    New DX - no meds yet   Diverticular disease    Hypertension     Past Surgical History:  Procedure Laterality Date   CATARACT EXTRACTION     COLONOSCOPY N/A 10/07/2015   Procedure: COLONOSCOPY;  Surgeon: Lucilla Lame, MD;  Location: [facility];  Service: Endoscopy;  Laterality: N/A;   COLONOSCOPY WITH PROPOFOL N/A 08/25/2021   Procedure: COLONOSCOPY WITH PROPOFOL;  Surgeon: Lucilla Lame, MD;  Location: [facility];  Service: Endoscopy;  Laterality: N/A;   DILATION AND CURETTAGE OF UTERUS     LAPAROSCOPIC SIGMOID COLECTOMY N/A 11/15/2015   Procedure: LAPAROSCOPIC SIGMOID COLECTOMY possible open, possible colostomy;  Surgeon: Jules Husbands, MD;  Location: [facility];  Service: General;  Laterality: N/A;   Please have Hand port ( gelport green by [employer]) ready   TUBAL LIGATION      Social History Social History   Tobacco Use   Smoking status: Former    Types: Cigarettes    Quit date: 11/29/1990    Years since quitting: 31.3   Smokeless tobacco: Never   Tobacco comments:    quit 1992  Vaping Use   Vaping Use: Never used  Substance Use Topics   Alcohol use: Yes    Alcohol/week: 1.0 - 2.0 standard drink of alcohol    Types: 1 - 2 Glasses of wine per week   Drug use: No    Family History Family History  Problem Relation Age of Onset   Heart disease Mother    Hypertension Mother    Aneurysm Mother    Alcohol abuse Father    Bladder Cancer Sister    Hypertension Sister    Breast cancer Sister 54   Hypertension Sister    Diverticulitis Brother    Hypertension Brother    Hypertension Sister    Diabetes Paternal Grandmother    Heart disease Paternal Grandmother     Allergies  Allergen Reactions   Codeine Itching and Anxiety    Other reaction(s): Other (See Comments) Nervous     REVIEW OF SYSTEMS (Negative unless checked)  Constitutional: '[]'$ Weight loss  '[]'$ Fever  '[]'$ Chills Cardiac: '[]'$ Chest pain   '[]'$ Chest pressure   '[]'$ Palpitations   '[]'$ Shortness of breath when laying flat   '[]'$ Shortness of breath  with exertion. Vascular:  '[x]'$ Pain in legs with walking   '[]'$ Pain in legs at rest  '[]'$ History of DVT   '[]'$ Phlebitis   '[]'$ Swelling in legs   '[]'$ Varicose veins   '[]'$ Non-healing ulcers Pulmonary:   '[]'$ Uses home oxygen   '[]'$ Productive cough   '[]'$ Hemoptysis   '[]'$ Wheeze  '[]'$ COPD   '[]'$ Asthma Neurologic:  '[]'$ Dizziness   '[]'$ Seizures   '[]'$ History of stroke   '[]'$ History of TIA  '[]'$ Aphasia   '[]'$ Vissual changes   '[]'$ Weakness or numbness in arm   '[]'$ Weakness or numbness in leg Musculoskeletal:   '[]'$ Joint swelling   '[]'$ Joint pain   '[]'$ Low back pain Hematologic:  '[]'$ Easy bruising  '[]'$ Easy bleeding   '[]'$ Hypercoagulable state   '[]'$ Anemic Gastrointestinal:  '[]'$ Diarrhea   '[]'$ Vomiting  '[]'$ Gastroesophageal reflux/heartburn    '[]'$ Difficulty swallowing. Genitourinary:  '[]'$ Chronic kidney disease   '[]'$ Difficult urination  '[]'$ Frequent urination   '[]'$ Blood in urine Skin:  '[]'$ Rashes   '[]'$ Ulcers  Psychological:  '[]'$ History of anxiety   '[]'$  History of major depression.  Physical Examination  There were no vitals filed for this visit. There is no height or weight on file to calculate BMI. Gen: WD/WN, NAD Head: [place]/AT, No temporalis wasting.  Ear/Nose/Throat: Hearing grossly intact, nares w/o erythema or drainage Eyes: PER, EOMI, sclera nonicteric.  Neck: Supple, no masses.  No bruit or JVD.  Pulmonary:  Good air movement, no audible wheezing, no use of accessory muscles.  Cardiac: RRR, normal S1, S2, no Murmurs. Vascular:  no carotid bruit noted Vessel Right Left  Radial Palpable Palpable  Carotid  Palpable  Palpable  Gastrointestinal: soft, non-distended. No guarding/no peritoneal signs.  Musculoskeletal: M/S 5/5 throughout.  No visible deformity.  Neurologic: CN 2-12 intact. Pain and light touch intact in extremities.  Symmetrical.  Speech is fluent. Motor exam as listed above. Psychiatric: Judgment intact, Mood & affect appropriate for pt's clinical situation. Dermatologic: No rashes or ulcers noted.  No changes consistent with cellulitis.   CBC Lab Results  Component Value Date   WBC 14.6 (H) 02/02/2022   HGB 15.3 (H) 02/02/2022   HCT 43.6 02/02/2022   MCV 96.9 02/02/2022   PLT 220 02/02/2022    BMET    Component Value Date/Time   NA 136 02/02/2022 0801   NA 142 01/06/2022 1127   NA 140 12/26/2011 1920   K 3.3 (L) 02/02/2022 0801   K 3.6 12/26/2011 1920   CL 100 02/02/2022 0801   CL 103 12/26/2011 1920   CO2 22 02/02/2022 0801   CO2 26 12/26/2011 1920   GLUCOSE 96 02/02/2022 0801   GLUCOSE 90 12/26/2011 1920   BUN 15 02/02/2022 0801   BUN 17 01/06/2022 1127   BUN 19 (H) 12/26/2011 1920   CREATININE 0.61 02/02/2022 0801   CREATININE 0.76 12/26/2011 1920   CALCIUM 9.2 02/02/2022 0801   CALCIUM 9.2  12/26/2011 1920   GFRNONAA >60 02/02/2022 0801   GFRNONAA >60 12/26/2011 1920   GFRAA 85 04/10/2019 1212   GFRAA >60 12/26/2011 1920   CrCl cannot be calculated (Patient's most recent lab result is older than the maximum 21 days allowed.).  COAG Lab Results  Component Value Date   INR 1.11 08/31/2015   INR 1.00 08/30/2015    Radiology No results found.   Assessment/Plan 1. Bilateral carotid artery stenosis Recommend:  Given the patient's asymptomatic subcritical stenosis no further invasive testing or surgery at this time.  Duplex ultrasound shows 1-39% stenosis bilaterally.  Continue antiplatelet therapy as prescribed Continue management of CAD, HTN and  Hyperlipidemia Healthy heart diet,  encouraged exercise at least 4 times per week Follow up in 12 months with duplex ultrasound and physical exam  - VAS US CAROTID - VAS US CAROTID; Future  2. Hypertension associated with diabetes ([place]) Continue antihypertensive medications as already ordered, these medications have been reviewed and there are no changes at this time.  3. Diabetes mellitus without complication ([place]) Continue hypoglycemic medications as already ordered, these medications have been reviewed and there are no changes at this time.  Hgb A1C to be monitored as already arranged by primary service  4. Hyperlipidemia associated with type 2 diabetes mellitus ([place]) Continue statin as ordered and reviewed, no changes at this time    Hortencia Pilar, MD  04/09/2022 12:21 PM

## 2022-04-13 ENCOUNTER — Ambulatory Visit (INDEPENDENT_AMBULATORY_CARE_PROVIDER_SITE_OTHER): Payer: Medicare Other | Admitting: Vascular Surgery

## 2022-04-13 ENCOUNTER — Encounter (INDEPENDENT_AMBULATORY_CARE_PROVIDER_SITE_OTHER): Payer: Self-pay | Admitting: Vascular Surgery

## 2022-04-13 ENCOUNTER — Ambulatory Visit (INDEPENDENT_AMBULATORY_CARE_PROVIDER_SITE_OTHER): Payer: Medicare Other

## 2022-04-13 ENCOUNTER — Other Ambulatory Visit (INDEPENDENT_AMBULATORY_CARE_PROVIDER_SITE_OTHER): Payer: Self-pay | Admitting: Vascular Surgery

## 2022-04-13 VITALS — BP 178/78 | HR 64 | Resp 16 | Wt 142.8 lb

## 2022-04-13 DIAGNOSIS — E785 Hyperlipidemia, unspecified: Secondary | ICD-10-CM | POA: Diagnosis not present

## 2022-04-13 DIAGNOSIS — E1169 Type 2 diabetes mellitus with other specified complication: Secondary | ICD-10-CM | POA: Diagnosis not present

## 2022-04-13 DIAGNOSIS — I6523 Occlusion and stenosis of bilateral carotid arteries: Secondary | ICD-10-CM

## 2022-04-13 DIAGNOSIS — E119 Type 2 diabetes mellitus without complications: Secondary | ICD-10-CM

## 2022-04-13 DIAGNOSIS — I152 Hypertension secondary to endocrine disorders: Secondary | ICD-10-CM

## 2022-04-13 DIAGNOSIS — E1159 Type 2 diabetes mellitus with other circulatory complications: Secondary | ICD-10-CM

## 2022-04-21 DIAGNOSIS — D2261 Melanocytic nevi of right upper limb, including shoulder: Secondary | ICD-10-CM | POA: Diagnosis not present

## 2022-04-21 DIAGNOSIS — L728 Other follicular cysts of the skin and subcutaneous tissue: Secondary | ICD-10-CM | POA: Diagnosis not present

## 2022-04-21 DIAGNOSIS — Z85828 Personal history of other malignant neoplasm of skin: Secondary | ICD-10-CM | POA: Diagnosis not present

## 2022-04-21 DIAGNOSIS — L821 Other seborrheic keratosis: Secondary | ICD-10-CM | POA: Diagnosis not present

## 2022-04-21 DIAGNOSIS — D2262 Melanocytic nevi of left upper limb, including shoulder: Secondary | ICD-10-CM | POA: Diagnosis not present

## 2022-06-22 ENCOUNTER — Other Ambulatory Visit: Payer: Self-pay | Admitting: Physician Assistant

## 2022-06-22 DIAGNOSIS — I10 Essential (primary) hypertension: Secondary | ICD-10-CM

## 2022-06-22 MED ORDER — LISINOPRIL-HYDROCHLOROTHIAZIDE 20-25 MG PO TABS: 1.0000 | ORAL_TABLET | Freq: Every day | ORAL | 1 refills | Status: DC

## 2022-06-22 NOTE — Telephone Encounter (Signed)
[facility] faxed refill request for the following medications:  lisinopril-hydrochlorothiazide (ZESTORETIC) 20-25 MG tablet    Please advise.

## 2022-07-27 ENCOUNTER — Ambulatory Visit (INDEPENDENT_AMBULATORY_CARE_PROVIDER_SITE_OTHER): Payer: Medicare Other

## 2022-07-27 VITALS — BP 126/72 | Ht 63.0 in | Wt 147.0 lb

## 2022-07-27 DIAGNOSIS — Z0000 Encounter for general adult medical examination without abnormal findings: Secondary | ICD-10-CM | POA: Diagnosis not present

## 2022-07-27 NOTE — Patient Instructions (Signed)
[name] , Thank you for taking time to come for your Medicare Wellness Visit. I appreciate your ongoing commitment to your health goals. Please review the following plan we discussed and let me know if I can assist you in the future.   These are the goals we discussed:  Goals      DIET - EAT MORE FRUITS AND VEGETABLES     Exercise 3x per week (30 min per time)     Recommend increasing exercise to walking 3 days a week for 30 minutes.        This is a list of the screening recommended for you and due dates:  Health Maintenance  Topic Date Due   COVID-19 Vaccine (1) Never done   Zoster (Shingles) Vaccine (1 of 2) Never done   DTaP/Tdap/Td vaccine (2 - Td or Tdap) 10/15/2019   Eye exam for diabetics  11/19/2021   Mammogram  12/25/2021   Yearly kidney health urinalysis for diabetes  07/22/2022   Hemoglobin A1C  07/09/2022   Flu Shot  08/23/2022*   Complete foot exam   01/07/2023   Yearly kidney function blood test for diabetes  02/03/2023   Medicare Annual Wellness Visit  07/27/2023   DEXA scan (bone density measurement)  09/09/2026   Colon Cancer Screening  08/26/2031   Pneumonia Vaccine  Completed   Hepatitis C Screening: USPSTF Recommendation to screen - Ages 15-79 yo.  Completed   HPV Vaccine  Aged Out  *Topic was postponed. The date shown is not the original due date.    Advanced directives: no  Conditions/risks identified: low fall risk  Next appointment: Follow up in one year for your annual wellness visit 07/28/2023 @ 9:15am in person   Preventive Care 65 Years and Older, Female Preventive care refers to lifestyle choices and visits with your health care provider that can promote health and wellness. What does preventive care include? A yearly physical exam. This is also called an annual well check. Dental exams once or twice a year. Routine eye exams. Ask your health care provider how often you should have your eyes checked. Personal lifestyle choices,  including: Daily care of your teeth and gums. Regular physical activity. Eating a healthy diet. Avoiding tobacco and drug use. Limiting alcohol use. Practicing safe sex. Taking low-dose aspirin every day. Taking vitamin and mineral supplements as recommended by your health care provider. What happens during an annual well check? The services and screenings done by your health care provider during your annual well check will depend on your age, overall health, lifestyle risk factors, and family history of disease. Counseling  Your health care provider may ask you questions about your: Alcohol use. Tobacco use. Drug use. Emotional well-being. Home and relationship well-being. Sexual activity. Eating habits. History of falls. Memory and ability to understand (cognition). Work and work Statistician. Reproductive health. Screening  You may have the following tests or measurements: Height, weight, and BMI. Blood pressure. Lipid and cholesterol levels. These may be checked every 5 years, or more frequently if you are over 1 years old. Skin check. Lung cancer screening. You may have this screening every year starting at age 28 if you have a 30-pack-year history of smoking and currently smoke or have quit within the past 15 years. Fecal occult blood test (FOBT) of the stool. You may have this test every year starting at age 61. Flexible sigmoidoscopy or colonoscopy. You may have a sigmoidoscopy every 5 years or a colonoscopy every 10 years  starting at age 66. Hepatitis C blood test. Hepatitis B blood test. Sexually transmitted disease (STD) testing. Diabetes screening. This is done by checking your blood sugar (glucose) after you have not eaten for a while (fasting). You may have this done every 1-3 years. Bone density scan. This is done to screen for osteoporosis. You may have this done starting at age 55. Mammogram. This may be done every 1-2 years. Talk to your health care provider  about how often you should have regular mammograms. Talk with your health care provider about your test results, treatment options, and if necessary, the need for more tests. Vaccines  Your health care provider may recommend certain vaccines, such as: Influenza vaccine. This is recommended every year. Tetanus, diphtheria, and acellular pertussis (Tdap, Td) vaccine. You may need a Td booster every 10 years. Zoster vaccine. You may need this after age 80. Pneumococcal 13-valent conjugate (PCV13) vaccine. One dose is recommended after age 69. Pneumococcal polysaccharide (PPSV23) vaccine. One dose is recommended after age 55. Talk to your health care provider about which screenings and vaccines you need and how often you need them. This information is not intended to replace advice given to you by your health care provider. Make sure you discuss any questions you have with your health care provider. Document Released: 06/07/2015 Document Revised: 01/29/2016 Document Reviewed: 03/12/2015 Elsevier Interactive Patient Education  2017 [place] Prevention in the Home Falls can cause injuries. They can happen to people of all ages. There are many things you can do to make your home safe and to help prevent falls. What can I do on the outside of my home? Regularly fix the edges of walkways and driveways and fix any cracks. Remove anything that might make you trip as you walk through a door, such as a raised step or threshold. Trim any bushes or trees on the path to your home. Use bright outdoor lighting. Clear any walking paths of anything that might make someone trip, such as rocks or tools. Regularly check to see if handrails are loose or broken. Make sure that both sides of any steps have handrails. Any raised decks and porches should have guardrails on the edges. Have any leaves, snow, or ice cleared regularly. Use sand or salt on walking paths during winter. Clean up any spills in  your garage right away. This includes oil or grease spills. What can I do in the bathroom? Use night lights. Install grab bars by the toilet and in the tub and shower. Do not use towel bars as grab bars. Use non-skid mats or decals in the tub or shower. If you need to sit down in the shower, use a plastic, non-slip stool. Keep the floor dry. Clean up any water that spills on the floor as soon as it happens. Remove soap buildup in the tub or shower regularly. Attach bath mats securely with double-sided non-slip rug tape. Do not have throw rugs and other things on the floor that can make you trip. What can I do in the bedroom? Use night lights. Make sure that you have a light by your bed that is easy to reach. Do not use any sheets or blankets that are too big for your bed. They should not hang down onto the floor. Have a firm chair that has side arms. You can use this for support while you get dressed. Do not have throw rugs and other things on the floor that can make you trip. What  can I do in the kitchen? Clean up any spills right away. Avoid walking on wet floors. Keep items that you use a lot in easy-to-reach places. If you need to reach something above you, use a strong step stool that has a grab bar. Keep electrical cords out of the way. Do not use floor polish or wax that makes floors slippery. If you must use wax, use non-skid floor wax. Do not have throw rugs and other things on the floor that can make you trip. What can I do with my stairs? Do not leave any items on the stairs. Make sure that there are handrails on both sides of the stairs and use them. Fix handrails that are broken or loose. Make sure that handrails are as long as the stairways. Check any carpeting to make sure that it is firmly attached to the stairs. Fix any carpet that is loose or worn. Avoid having throw rugs at the top or bottom of the stairs. If you do have throw rugs, attach them to the floor with carpet  tape. Make sure that you have a light switch at the top of the stairs and the bottom of the stairs. If you do not have them, ask someone to add them for you. What else can I do to help prevent falls? Wear shoes that: Do not have high heels. Have rubber bottoms. Are comfortable and fit you well. Are closed at the toe. Do not wear sandals. If you use a stepladder: Make sure that it is fully opened. Do not climb a closed stepladder. Make sure that both sides of the stepladder are locked into place. Ask someone to hold it for you, if possible. Clearly mark and make sure that you can see: Any grab bars or handrails. First and last steps. Where the edge of each step is. Use tools that help you move around (mobility aids) if they are needed. These include: Canes. Walkers. Scooters. Crutches. Turn on the lights when you go into a dark area. Replace any light bulbs as soon as they burn out. Set up your furniture so you have a clear path. Avoid moving your furniture around. If any of your floors are uneven, fix them. If there are any pets around you, be aware of where they are. Review your medicines with your doctor. Some medicines can make you feel dizzy. This can increase your chance of falling. Ask your doctor what other things that you can do to help prevent falls. This information is not intended to replace advice given to you by your health care provider. Make sure you discuss any questions you have with your health care provider. Document Released: 03/07/2009 Document Revised: 10/17/2015 Document Reviewed: 06/15/2014 Elsevier Interactive Patient Education  2017 [employer].

## 2022-07-27 NOTE — Progress Notes (Signed)
Subjective:   Nicole Tucker is a 77 y.o. female who presents for Medicare Annual (Subsequent) preventive examination.  Review of Systems     Cardiac Risk Factors include: advanced age (>58mn, >>51women);dyslipidemia;hypertension     Objective:    Today's Vitals   07/27/22 0914  BP: 126/72  Weight: 147 lb (66.7 kg)  Height: '5\' 3"'$  (1.6 m)   Body mass index is 26.04 kg/m.     07/27/2022    9:30 AM 08/25/2021    8:22 AM 07/22/2021   11:26 AM 06/25/2020    2:17 PM 03/06/2019    2:05 PM 11/02/2017    9:08 AM 12/07/2016    2:58 PM  Advanced Directives  Does Patient Have a Medical Advance Directive? No No No No No No No  Does patient want to make changes to medical advance directive?  No - Patient declined       Would patient like information on creating a medical advance directive? No - Patient declined Yes (MAU/Ambulatory/Procedural Areas - Information given) No - Patient declined No - Patient declined No - Patient declined Yes (MAU/Ambulatory/Procedural Areas - Information given)     Current Medications (verified) Outpatient Encounter Medications as of 07/27/2022  Medication Sig   Ascorbic Acid (VITAMIN C PO) Take by mouth daily.   B Complex Vitamins (VITAMIN B COMPLEX PO) Take by mouth daily.   BIOTIN PO Take by mouth.   Calcium Carb-Cholecalciferol 333-3.325 MG-MCG TABS Take by mouth.   Cholecalciferol (VITAMIN D) 2000 units CAPS Take by mouth daily.    COLLAGEN PO Take by mouth.   Garlic 199991111MG CAPS Take 3,000 mg by mouth.   Iron Combinations (IRON COMPLEX PO) Take by mouth.   lisinopril (ZESTRIL) 10 MG tablet Take 1 tablet (10 mg total) by mouth daily. Please schedule office visit before any future refill.   lisinopril-hydrochlorothiazide (ZESTORETIC) 20-25 MG tablet Take 1 tablet by mouth daily.   Multiple Vitamin (MULTIVITAMIN WITH MINERALS) TABS tablet Take 1 tablet by mouth daily.   Omega-3 Fatty Acids (FISH OIL ODOR-LESS PO) Take by mouth.   rosuvastatin (CRESTOR) 5 MG  tablet Take 1 tablet (5 mg total) by mouth daily.   VITAMIN A PO Take by mouth.   ZINC PICOLINATE PO Take by mouth.   No facility-administered encounter medications on file as of 07/27/2022.    Allergies (verified) Codeine   History: Past Medical History:  Diagnosis Date   Arthritis    Diabetes mellitus without complication (HCC)    New DX - no meds yet   Diverticular disease    Hypertension    Past Surgical History:  Procedure Laterality Date   CATARACT EXTRACTION     COLONOSCOPY N/A 10/07/2015   Procedure: COLONOSCOPY;  Surgeon: DLucilla Lame MD;  [place] [facility]  Service: Endoscopy;  Laterality: N/A;   COLONOSCOPY WITH PROPOFOL N/A 08/25/2021   Procedure: COLONOSCOPY WITH PROPOFOL;  Surgeon: WLucilla Lame MD;  Location[place] [facility]  Service: Endoscopy;  Laterality: N/A;   DILATION AND CURETTAGE OF UTERUS     LAPAROSCOPIC SIGMOID COLECTOMY N/A 11/15/2015   Procedure: LAPAROSCOPIC SIGMOID COLECTOMY possible open, possible colostomy;  Surgeon: DJules Husbands MD;  Location: [facility];  Service: General;  Laterality: N/A;  Please have Hand port ( gelport green by [facility]) ready   TUBAL LIGATION     Family History  Problem Relation Age of Onset   Heart disease Mother    Hypertension Mother    Aneurysm Mother  Alcohol abuse Father    Bladder Cancer Sister    Hypertension Sister    Breast cancer Sister 38   Hypertension Sister    Diverticulitis Brother    Hypertension Brother    Hypertension Sister    Diabetes Paternal Grandmother    Heart disease Paternal Grandmother    Social History   Socioeconomic History   Marital status: Divorced    Spouse name: Not on file   Number of children: 1   Years of education: Not on file   Highest education level: Bachelor's degree (e.g., BA, AB, BS)  Occupational History   Occupation: retired  Tobacco Use   Smoking status: Former    Types: Cigarettes    Quit date: 11/29/1990    Years since quitting: 31.6    Smokeless tobacco: Never   Tobacco comments:    quit 1992  Vaping Use   Vaping Use: Never used  Substance and Sexual Activity   Alcohol use: Yes    Alcohol/week: 1.0 - 2.0 standard drink of alcohol    Types: 1 - 2 Glasses of wine per week   Drug use: No   Sexual activity: Not on file  Other Topics Concern   Not on file  Social History Narrative   Not on file   Social Determinants of Health   Financial Resource Strain: Low Risk  (07/27/2022)   Overall Financial Resource Strain (CARDIA)    Difficulty of Paying Living Expenses: Not hard at all  Food Insecurity: No Food Insecurity (07/27/2022)   Hunger Vital Sign    Worried About Running Out of Food in the Last Year: Never true    [place] in the Last Year: Never true  Transportation Needs: Unknown (07/27/2022)   PRAPARE - Hydrologist (Medical): Not on file    Lack of Transportation (Non-Medical): No  Physical Activity: Inactive (07/27/2022)   Exercise Vital Sign    Days of Exercise per Week: 0 days    Minutes of Exercise per Session: 0 min  Stress: No Stress Concern Present (07/27/2022)   [place]    Feeling of Stress : Not at all  Social Connections: Socially Isolated (07/27/2022)   Social Connection and Isolation Panel [NHANES]    Frequency of Communication with Friends and Family: More than three times a week    Frequency of Social Gatherings with Friends and Family: More than three times a week    Attends Religious Services: Never    Marine scientist or Organizations: No    Attends Music therapist: Never    Marital Status: Divorced    Tobacco Counseling Counseling given: Not Answered Tobacco comments: quit 1992   Clinical Intake:  Pre-visit preparation completed: Yes  Pain : No/denies pain     BMI - recorded: 26.04 Nutritional Status: BMI 25 -29 Overweight Nutritional Risks: None Diabetes:  No  How often do you need to have someone help you when you read instructions, pamphlets, or other written materials from your doctor or pharmacy?: 1 - Never  Diabetic?no  Interpreter Needed?: No  Information entered by :: B.Korvin Valentine,LPN   Activities of Daily Living    07/27/2022    9:30 AM 01/06/2022   10:44 AM  In your present state of health, do you have any difficulty performing the following activities:  Hearing? 0 0  Vision? 0 0  Difficulty concentrating or making decisions? 0 0  Walking  or climbing stairs? 0 0  Dressing or bathing? 0 0  Doing errands, shopping? 0 0  Preparing Food and eating ? N   Using the Toilet? N   In the past six months, have you accidently leaked urine? N   Do you have problems with loss of bowel control? N   Managing your Medications? N   Managing your Finances? N   Housekeeping or managing your Housekeeping? N     Patient Care Team: Mikey Kirschner, PA-C as PCP - General (Physician Assistant) Delana Meyer, Dolores Lory, MD as Consulting Physician (Vascular Surgery) Oneta Rack, MD as Consulting Physician (Dermatology) Pa, [place] as Consulting Physician  Indicate any recent [place] you may have received from other than [facility] providers in the past year (date may be approximate).     Assessment:   This is a routine wellness examination for Aryam.  Hearing/Vision screen Hearing Screening - Comments:: Adequate hearing Vision Screening - Comments:: Adequate vision [address]  Dietary issues and exercise activities discussed: Exercise limited by: None identified   Goals Addressed   None    Depression Screen    07/27/2022    9:22 AM 01/06/2022   10:43 AM 07/22/2021   10:31 AM 10/18/2020    3:22 PM 06/25/2020    2:14 PM 04/10/2019   11:22 AM 03/06/2019    2:06 PM  PHQ 2/9 Scores  PHQ - 2 Score 0 0 0 0 0 0 0  PHQ- 9 Score  2 0 0       Fall Risk    07/27/2022    9:16 AM 01/06/2022   10:43 AM 07/22/2021   11:27  AM 07/22/2021   10:31 AM 10/18/2020    3:21 PM  Fall Risk   Falls in the past year? 1 0 1 1 0  Number falls in past yr: 0  1 0 0  Injury with Fall? 0  0 0 0  Comment broke right arm      Risk for fall due to : No Fall Risks  No Fall Risks  No Fall Risks  Follow up Education provided;Falls prevention discussed  Falls prevention discussed  Falls evaluation completed    FALL RISK PREVENTION PERTAINING TO THE HOME:  Any stairs in or around the home? Yes  If so, are there any without handrails? No  Home free of loose throw rugs in walkways, pet beds, electrical cords, etc? Yes  Adequate lighting in your home to reduce risk of falls? Yes   ASSISTIVE DEVICES UTILIZED TO PREVENT FALLS:  Life alert? No  Use of a cane, walker or w/c? No  Grab bars in the bathroom? Yes  Shower chair or bench in shower? No  Elevated toilet seat or a handicapped toilet? No   TIMED UP AND GO:  Was the test performed? Yes .  Length of time to ambulate 10 feet: 8 sec.   Gait steady and fast without use of assistive device  Cognitive Function:        07/27/2022    9:33 AM 10/27/2016    8:59 AM  6CIT Screen  What Year? 0 points 0 points  What month? 0 points 0 points  What time? 0 points 0 points  Count back from 20 0 points 0 points  Months in reverse 0 points 0 points  Repeat phrase 0 points 4 points  Total Score 0 points 4 points    Immunizations Immunization History  Administered Date(s) Administered  Pneumococcal Conjugate-13 09/10/2015   Pneumococcal Polysaccharide-23 10/27/2016   Tdap 10/14/2009    TDAP status: Up to date  Flu Vaccine status: Declined, Education has been provided regarding the importance of this vaccine but patient still declined. Advised may receive this vaccine at local pharmacy or Health Dept. Aware to provide a copy of the vaccination record if obtained from local pharmacy or Health Dept. Verbalized acceptance and understanding.  Pneumococcal vaccine status: Up to  date  Covid-19 vaccine status: Declined, Education has been provided regarding the importance of this vaccine but patient still declined. Advised may receive this vaccine at local pharmacy or Health Dept.or vaccine clinic. Aware to provide a copy of the vaccination record if obtained from local pharmacy or Health Dept. Verbalized acceptance and understanding.  Qualifies for Shingles Vaccine? Yes   Zostavax completed Yes   Shingrix Completed?: Yes  Screening Tests Health Maintenance  Topic Date Due   COVID-19 Vaccine (1) Never done   Zoster Vaccines- Shingrix (1 of 2) Never done   DTaP/Tdap/Td (2 - Td or Tdap) 10/15/2019   OPHTHALMOLOGY EXAM  11/19/2021   MAMMOGRAM  12/25/2021   Diabetic kidney evaluation - Urine ACR  07/22/2022   HEMOGLOBIN A1C  07/09/2022   INFLUENZA VACCINE  08/23/2022 (Originally 12/23/2021)   FOOT EXAM  01/07/2023   Diabetic kidney evaluation - eGFR measurement  02/03/2023   Medicare Annual Wellness (AWV)  07/27/2023   DEXA SCAN  09/09/2026   COLONOSCOPY (Pts 45-56yr Insurance coverage will need to be confirmed)  08/26/2031   Pneumonia Vaccine 75 Years old  Completed   Hepatitis C Screening  Completed   HPV VACCINES  Aged Out    Health Maintenance  Health Maintenance Due  Topic Date Due   COVID-19 Vaccine (1) Never done   Zoster Vaccines- Shingrix (1 of 2) Never done   DTaP/Tdap/Td (2 - Td or Tdap) 10/15/2019   OPHTHALMOLOGY EXAM  11/19/2021   MAMMOGRAM  12/25/2021   Diabetic kidney evaluation - Urine ACR  07/22/2022   HEMOGLOBIN A1C  07/09/2022    Colorectal cancer screening: Type of screening: Colonoscopy. Completed yes. Repeat every 10 years  Mammogram status: No longer required due to yes.  Bone Density status: Completed yes. Results reflect: Bone density results: OSTEOPENIA. Repeat every 5 years.  Lung Cancer Screening: (Low Dose CT Chest recommended if Age 75-80years, 30 pack-year currently smoking OR have quit w/in 15years.) does not  qualify.   Lung Cancer Screening Referral: no  Additional Screening:  Hepatitis C Screening: does not qualify; Completed yes  Vision Screening: Recommended annual ophthalmology exams for early detection of glaucoma and other disorders of the eye. Is the patient up to date with their annual eye exam?  Yes  Who is the provider or what is the name of the office in which the patient attends annual eye exams? Can't remember If pt is not established with a provider, would they like to be referred to a provider to establish care? No .   Dental Screening: Recommended annual dental exams for proper oral hygiene  Community Resource Referral / Chronic Care Management: CRR required this visit?  No   CCM required this visit?  No      Plan:     I have personally reviewed and noted the following in the patient's chart:   Medical and social history Use of alcohol, tobacco or illicit drugs  Current medications and supplements including opioid prescriptions. Patient is not currently taking opioid prescriptions. Functional ability and  status Nutritional status Physical activity Advanced directives List of other physicians Hospitalizations, surgeries, and ER visits in previous 12 months Vitals Screenings to include cognitive, depression, and falls Referrals and appointments  In addition, I have reviewed and discussed with patient certain preventive protocols, quality metrics, and best practice recommendations. A written personalized care plan for preventive services as well as general preventive health recommendations were provided to patient.     Roger Shelter, LPN   [id number]   Nurse Notes: pt is doing well..continues to heal from broken rt shoulder due to fall 6 months ago per pt. Pt has no concerns or questions at this time.

## 2022-09-03 ENCOUNTER — Other Ambulatory Visit: Payer: Self-pay

## 2022-09-03 DIAGNOSIS — I10 Essential (primary) hypertension: Secondary | ICD-10-CM

## 2022-09-03 MED ORDER — LISINOPRIL 10 MG PO TABS: 10.0000 mg | ORAL_TABLET | Freq: Every day | ORAL | 1 refills | Status: DC

## 2022-09-09 ENCOUNTER — Ambulatory Visit: Payer: Medicare Other | Admitting: Physician Assistant

## 2022-11-09 ENCOUNTER — Encounter: Payer: Self-pay | Admitting: Physician Assistant

## 2022-11-18 ENCOUNTER — Encounter: Payer: Self-pay | Admitting: Family Medicine

## 2022-11-18 ENCOUNTER — Ambulatory Visit (INDEPENDENT_AMBULATORY_CARE_PROVIDER_SITE_OTHER): Payer: Medicare Other | Admitting: Family Medicine

## 2022-11-18 ENCOUNTER — Ambulatory Visit: Payer: Self-pay

## 2022-11-18 VITALS — BP 173/88 | HR 67 | Ht 63.0 in | Wt 150.9 lb

## 2022-11-18 DIAGNOSIS — R300 Dysuria: Secondary | ICD-10-CM | POA: Insufficient documentation

## 2022-11-18 DIAGNOSIS — E119 Type 2 diabetes mellitus without complications: Secondary | ICD-10-CM

## 2022-11-18 DIAGNOSIS — N3001 Acute cystitis with hematuria: Secondary | ICD-10-CM | POA: Insufficient documentation

## 2022-11-18 DIAGNOSIS — R310 Gross hematuria: Secondary | ICD-10-CM | POA: Insufficient documentation

## 2022-11-18 LAB — POCT URINALYSIS DIPSTICK
Bilirubin, UA: NEGATIVE
Glucose, UA: NEGATIVE
Ketones, UA: NEGATIVE
Nitrite, UA: NEGATIVE
Protein, UA: NEGATIVE
Spec Grav, UA: 1.01 (ref 1.010–1.025)
Urobilinogen, UA: 0.2 E.U./dL
pH, UA: 7 (ref 5.0–8.0)

## 2022-11-18 MED ORDER — CEPHALEXIN 500 MG PO CAPS: 500.0000 mg | ORAL_CAPSULE | Freq: Four times a day (QID) | ORAL | 0 refills | Status: AC

## 2022-11-18 NOTE — Progress Notes (Signed)
I,Sha'taria Tyson,acting as a Neurosurgeon for [employer], MD.,have documented all relevant documentation on the behalf of Ronnald Ramp, MD,as directed by  Ronnald Ramp, MD while in the presence of Ronnald Ramp, MD.   Established patient visit   Patient: Nicole Tucker   DOB: Aug 05, 1945   77 y.o. Female  MRN: [id number] Visit Date: 11/18/2022  Today's healthcare provider: Ronnald Ramp, MD   Chief Complaint  Patient presents with   Urinary Tract Infection   Subjective    HPI  Urinary symptoms  She reports new onset hematuria and stinging sensation when urinating associated with some pain . The current episode started this morning and is staying constant. Patient states symptoms are 4/10 in intensity, occurring intermittently. She  has not been recently treated for similar symptoms.    Associated symptoms: No abdominal pain No back pain  No chills No constipation  No cramping Yes diarrhea  No discharge No fever  Yes hematuria No nausea, had some this AM but resolved now  No vomiting    ---------------------------------------------------------------------------------------  Diabetes: last A1c at goal, manages glucose with lifestyle management, not on medications currently   Medications: Outpatient Medications Prior to Visit  Medication Sig   Ascorbic Acid (VITAMIN C PO) Take by mouth daily.   B Complex Vitamins (VITAMIN B COMPLEX PO) Take by mouth daily.   BIOTIN PO Take by mouth.   Calcium Carb-Cholecalciferol 333-3.325 MG-MCG TABS Take by mouth.   Cholecalciferol (VITAMIN D) 2000 units CAPS Take by mouth daily.    COLLAGEN PO Take by mouth.   Garlic 1500 MG CAPS Take 3,000 mg by mouth.   Iron Combinations (IRON COMPLEX PO) Take by mouth.   lisinopril (ZESTRIL) 10 MG tablet Take 1 tablet (10 mg total) by mouth daily. Please schedule office visit before any future refill.   lisinopril-hydrochlorothiazide (ZESTORETIC)  20-25 MG tablet Take 1 tablet by mouth daily.   Multiple Vitamin (MULTIVITAMIN WITH MINERALS) TABS tablet Take 1 tablet by mouth daily.   Omega-3 Fatty Acids (FISH OIL ODOR-LESS PO) Take by mouth.   rosuvastatin (CRESTOR) 5 MG tablet Take 1 tablet (5 mg total) by mouth daily.   VITAMIN A PO Take by mouth.   ZINC PICOLINATE PO Take by mouth.   No facility-administered medications prior to visit.    Review of Systems     Objective    BP (!) 173/88 (BP Location: Left Arm, Patient Position: Sitting, Cuff Size: Normal)   Pulse 67   Ht 5\' 3"  (1.6 m)   Wt 150 lb 14.4 oz (68.4 kg)   BMI 26.73 kg/m    Physical Exam Constitutional:      General: She is not in acute distress.    Appearance: Normal appearance. She is not ill-appearing or diaphoretic.  Cardiovascular:     Rate and Rhythm: Normal rate and regular rhythm.  Pulmonary:     Effort: Pulmonary effort is normal. No respiratory distress.     Breath sounds: Normal breath sounds. No wheezing, rhonchi or rales.  Abdominal:     General: Abdomen is flat. Bowel sounds are normal. There is no distension. There are no signs of injury.     Palpations: Abdomen is soft. There is no shifting dullness, fluid wave, hepatomegaly, mass or pulsatile mass.     Tenderness: There is no abdominal tenderness. There is no right CVA tenderness, left CVA tenderness, guarding or rebound.     Hernia: No hernia is present.  No results found for any visits on 11/18/22.  Assessment & Plan     Problem List Items Addressed This Visit       Endocrine   Diabetes mellitus without complication (HCC)    Chronic  Lab Results  Component Value Date   HGBA1C 5.5 01/06/2022  Well controlled Last A1c was within goal range of less than 7 at 5.5  Continue current regimen  Urine albumin collected today       Relevant Orders   Urine microalbumin-creatinine with uACR     Genitourinary   Gross hematuria    Acute  Patient overall stable, no CVA  tenderness nor fever or chills to raise suspicion for pyelonephritis at this time  Positive U/A for blood, moderate  Recommended empiric treatment for UTI with Keflex 500mg  TID for 7 days  Recommended RTC if symptoms do not improve in 48 hours  Will await culture results and microscopy for hematuria        Acute cystitis with hematuria - Primary   Relevant Medications   cephALEXin (KEFLEX) 500 MG capsule     Return if symptoms worsen or fail to improve.       The entirety of the information documented in the History of Present Illness, Review of Systems and Physical Exam were personally obtained by me. Portions of this information were initially documented by Acey Lav . I, Ronnald Ramp, MD have reviewed the documentation above for thoroughness and accuracy.      Ronnald Ramp, MD  [facility] [phone] (phone) [phone] (fax)  [facility]

## 2022-11-18 NOTE — Assessment & Plan Note (Signed)
Chronic  Lab Results  Component Value Date   HGBA1C 5.5 01/06/2022   Well controlled Last A1c was within goal range of less than 7 at 5.5  Continue current regimen  Urine albumin collected today

## 2022-11-18 NOTE — Addendum Note (Signed)
Addended by: Lily Kocher on: 11/18/2022 04:09 PM   Modules accepted: Orders

## 2022-11-18 NOTE — Patient Instructions (Addendum)
Please contact [phone] to schedule your mammogram. You will be asked your location preference to have procedure performed. You have two options listed below.  1) [facility] located at [address] [place], [place] [address] 2) [facility] located at [address] [place], [place] [address]  Upon results being received our office will contact you. As well as all results can be viewed through your MyChart. Please feel free to contact us if you have any further questions or concerns.    Urinary Tract Infection, Adult  A urinary tract infection (UTI) is an infection of any part of the urinary tract. The urinary tract includes the kidneys, ureters, bladder, and urethra. These organs make, store, and get rid of urine in the body. An upper UTI affects the ureters and kidneys. A lower UTI affects the bladder and urethra. What are the causes? Most urinary tract infections are caused by bacteria in your genital area around your urethra, where urine leaves your body. These bacteria grow and cause inflammation of your urinary tract. What increases the risk? You are more likely to develop this condition if: You have a urinary catheter that stays in place. You are not able to control when you urinate or have a bowel movement (incontinence). You are female and you: Use a spermicide or diaphragm for birth control. Have low estrogen levels. Are pregnant. You have certain genes that increase your risk. You are sexually active. You take antibiotic medicines. You have a condition that causes your flow of urine to slow down, such as: An enlarged prostate, if you are female. Blockage in your urethra. A kidney stone. A nerve condition that affects your bladder control (neurogenic bladder). Not getting enough to drink, or not urinating often. You have certain medical conditions, such as: Diabetes. A weak disease-fighting system (immunesystem). Sickle cell disease. Gout. Spinal  cord injury. What are the signs or symptoms? Symptoms of this condition include: Needing to urinate right away (urgency). Frequent urination. This may include small amounts of urine each time you urinate. Pain or burning with urination. Blood in the urine. Urine that smells bad or unusual. Trouble urinating. Cloudy urine. Vaginal discharge, if you are female. Pain in the abdomen or the lower back. You may also have: Vomiting or a decreased appetite. Confusion. Irritability or tiredness. A fever or chills. Diarrhea. The first symptom in older adults may be confusion. In some cases, they may not have any symptoms until the infection has worsened. How is this diagnosed? This condition is diagnosed based on your medical history and a physical exam. You may also have other tests, including: Urine tests. Blood tests. Tests for STIs (sexually transmitted infections). If you have had more than one UTI, a cystoscopy or imaging studies may be done to determine the cause of the infections. How is this treated? Treatment for this condition includes: Antibiotic medicine. Over-the-counter medicines to treat discomfort. Drinking enough water to stay hydrated. If you have frequent infections or have other conditions such as a kidney stone, you may need to see a health care provider who specializes in the urinary tract (urologist). In rare cases, urinary tract infections can cause sepsis. Sepsis is a life-threatening condition that occurs when the body responds to an infection. Sepsis is treated in the hospital with IV antibiotics, fluids, and other medicines. Follow these instructions at home:  Medicines Take over-the-counter and prescription medicines only as told by your health care provider. If you were prescribed an antibiotic medicine, take it  as told by your health care provider. Do not stop using the antibiotic even if you start to feel better. General instructions Make sure you: Empty  your bladder often and completely. Do not hold urine for long periods of time. Empty your bladder after sex. Wipe from front to back after urinating or having a bowel movement if you are female. Use each tissue only one time when you wipe. Drink enough fluid to keep your urine pale yellow. Keep all follow-up visits. This is important. Contact a health care provider if: Your symptoms do not get better after 1-2 days. Your symptoms go away and then return. Get help right away if: You have severe pain in your back or your lower abdomen. You have a fever or chills. You have nausea or vomiting. Summary A urinary tract infection (UTI) is an infection of any part of the urinary tract, which includes the kidneys, ureters, bladder, and urethra. Most urinary tract infections are caused by bacteria in your genital area. Treatment for this condition often includes antibiotic medicines. If you were prescribed an antibiotic medicine, take it as told by your health care provider. Do not stop using the antibiotic even if you start to feel better. Keep all follow-up visits. This is important. This information is not intended to replace advice given to you by your health care provider. Make sure you discuss any questions you have with your health care provider. Document Revised: 12/17/2019 Document Reviewed: 12/22/2019 Elsevier Patient Education  2024 [employer].

## 2022-11-18 NOTE — Telephone Encounter (Signed)
  Pt called in has blood in urine that started today, also had burning when urinating   Left message to call back about symptoms.

## 2022-11-18 NOTE — Telephone Encounter (Signed)
     Chief Complaint: Blood in urine, burning with urination Symptoms: Above Frequency: Today Pertinent Negatives: Patient denies fever Disposition: [] ED /[] Urgent Care (no appt availability in office) / [x] Appointment(In office/virtual)/ []  [place] [facility]/ [] Home Care/ [] Refused Recommended Disposition /[] [place] [facility]/ []  Follow-up with PCP Additional Notes:   Reason for Disposition  Urinating more frequently than usual (i.e., frequency)  Answer Assessment - Initial Assessment Questions 1. SYMPTOM: "What's the main symptom you're concerned about?" (e.g., frequency, incontinence)     Blood in urine, burning 2. ONSET: "When did the    start?"     Today 3. PAIN: "Is there any pain?" If Yes, ask: "How bad is it?" (Scale: 1-10; mild, moderate, severe)     4 4. CAUSE: "What do you think is causing the symptoms?"     UTI 5. OTHER SYMPTOMS: "Do you have any other symptoms?" (e.g., blood in urine, fever, flank pain, pain with urination)     No 6. PREGNANCY: "Is there any chance you are pregnant?" "When was your last menstrual period?"     No  Protocols used: Urinary Symptoms-A-AH

## 2022-11-18 NOTE — Assessment & Plan Note (Signed)
Acute  Patient overall stable, no CVA tenderness nor fever or chills to raise suspicion for pyelonephritis at this time  Positive U/A for blood, moderate  Recommended empiric treatment for UTI with Keflex 500mg  TID for 7 days  Recommended RTC if symptoms do not improve in 48 hours  Will await culture results and microscopy for hematuria

## 2022-11-19 ENCOUNTER — Ambulatory Visit: Payer: Self-pay

## 2022-11-19 ENCOUNTER — Ambulatory Visit: Payer: Medicare Other | Admitting: Physician Assistant

## 2022-11-19 LAB — MICROALBUMIN / CREATININE URINE RATIO
Creatinine, Urine: 30.8 mg/dL
Microalb/Creat Ratio: 90 mg/g{creat} — ABNORMAL HIGH (ref 0–29)
Microalbumin, Urine: 27.6 ug/mL

## 2022-11-19 LAB — SPECIMEN STATUS REPORT

## 2022-11-19 NOTE — Telephone Encounter (Signed)
      Chief Complaint: Pt. JUST STARTED ANTIBIOTIC THIS MORNING. Symptoms "a little worse." Seen in practice yesterday. Instructed to give antibiotic 24-48 hours to work.  Symptoms: Hematuria, pain with urination Frequency: This week Pertinent Negatives: Patient denies fever Disposition: [] ED /[] Urgent Care (no appt availability in office) / [] Appointment(In office/virtual)/ []  [place] [facility]/ [x] Home Care/ [] Refused Recommended Disposition /[] [place] [facility]/ []  Follow-up with PCP Additional Notes: Call back for worsening of symptoms.  Reason for Disposition  [1] Taking antibiotic < 72 hours (3 days) for UTI AND [2] painful urination or frequency is SAME (unchanged, not better)  Answer Assessment - Initial Assessment Questions 1. MAIN SYMPTOM: "What is the main symptom you are concerned about?" (e.g., painful urination, urine frequency)     Blood in urine, pain 2. BETTER-SAME-WORSE: "Are you getting better, staying the same, or getting worse compared to how you felt at your last visit to the doctor (most recent medical visit)?"     Worse 3. PAIN: "How bad is the pain?"  (e.g., Scale 1-10; mild, moderate, or severe)   - MILD (1-3): complains slightly about urination hurting   - MODERATE (4-7): interferes with normal activities     - SEVERE (8-10): excruciating, unwilling or unable to urinate because of the pain      Low abdomen and pain with urination 4. FEVER: "Do you have a fever?" If Yes, ask: "What is it, how was it measured, and when did it start?"     No 5. OTHER SYMPTOMS: "Do you have any other symptoms?" (e.g., blood in the urine, flank pain, vaginal discharge)     Blood in urine 6. DIAGNOSIS: "When was the UTI diagnosed?" "By whom?" "Was it a kidney infection, bladder infection or both?"     Yes 7. ANTIBIOTIC: "What antibiotic(s) are you taking?" "How many times per day?"     Keflex 8. ANTIBIOTIC - START DATE: "When did you start taking the antibiotic?"      This morning  Protocols used: Urinary Tract Infection on Antibiotic Follow-up Call - [facility]

## 2022-11-19 NOTE — Telephone Encounter (Signed)
Patient can add  OTC Azo to help with discomfort

## 2022-11-19 NOTE — Telephone Encounter (Signed)
Left detailed vm per dpr. CRM created. Ok for [facility] to advise

## 2022-11-24 LAB — URINE CULTURE

## 2022-12-01 ENCOUNTER — Encounter: Payer: Self-pay | Admitting: Family Medicine

## 2022-12-02 ENCOUNTER — Encounter: Payer: Self-pay | Admitting: Physician Assistant

## 2022-12-02 ENCOUNTER — Ambulatory Visit: Payer: Medicare Other | Admitting: Physician Assistant

## 2022-12-02 ENCOUNTER — Ambulatory Visit (INDEPENDENT_AMBULATORY_CARE_PROVIDER_SITE_OTHER): Payer: Medicare Other | Admitting: Physician Assistant

## 2022-12-02 VITALS — BP 141/56 | HR 67 | Temp 98.3°F | Resp 12 | Ht 63.0 in | Wt 150.5 lb

## 2022-12-02 DIAGNOSIS — R198 Other specified symptoms and signs involving the digestive system and abdomen: Secondary | ICD-10-CM | POA: Diagnosis not present

## 2022-12-02 DIAGNOSIS — R310 Gross hematuria: Secondary | ICD-10-CM

## 2022-12-02 DIAGNOSIS — R300 Dysuria: Secondary | ICD-10-CM | POA: Diagnosis not present

## 2022-12-02 LAB — POCT URINALYSIS DIPSTICK
Bilirubin, UA: NEGATIVE
Glucose, UA: NEGATIVE
Ketones, UA: NEGATIVE
Nitrite, UA: NEGATIVE
Protein, UA: NEGATIVE
Spec Grav, UA: 1.015 (ref 1.010–1.025)
Urobilinogen, UA: 0.2 E.U./dL
pH, UA: 6 (ref 5.0–8.0)

## 2022-12-02 NOTE — Progress Notes (Signed)
Established patient visit   Patient: Nicole Tucker   DOB: 1945/07/03   77 y.o. Female  MRN: [id number] Visit Date: 12/02/2022  Today's healthcare provider: Alfredia Ferguson, PA-C   Cc. Dysuria, hematuria  Subjective    HPI  Pt was seen 6/92f or UTI symptoms, tx with keflex-- pt finished abx as prescribed, culture returned Proteus mirabilis, sensitive.  Discussed the use of AI scribe software for clinical note transcription with the patient, who gave verbal consent to proceed.  History of Present Illness   The patient presents with intermittent hematuria and dysuria. The symptoms initially resolved after a course of antibiotics, but recurred with 'lots of blood' and pain. Currently, they deny visible hematuria and dysuria, but report urinary frequency and a sensation of pressure. They deny flank pain, abdominal pain. Reports nausea that improves after eating.  The patient also reports a change in bowel habits, with multiple bowel movements in the morning and none throughout the rest of the day.       Medications: Outpatient Medications Prior to Visit  Medication Sig   Ascorbic Acid (VITAMIN C PO) Take by mouth daily.   B Complex Vitamins (VITAMIN B COMPLEX PO) Take by mouth daily.   BIOTIN PO Take by mouth.   Calcium Carb-Cholecalciferol 333-3.325 MG-MCG TABS Take by mouth.   Cholecalciferol (VITAMIN D) 2000 units CAPS Take by mouth daily.    COLLAGEN PO Take by mouth.   Garlic 1500 MG CAPS Take 3,000 mg by mouth.   Iron Combinations (IRON COMPLEX PO) Take by mouth.   lisinopril (ZESTRIL) 10 MG tablet Take 1 tablet (10 mg total) by mouth daily. Please schedule office visit before any future refill.   lisinopril-hydrochlorothiazide (ZESTORETIC) 20-25 MG tablet Take 1 tablet by mouth daily.   Multiple Vitamin (MULTIVITAMIN WITH MINERALS) TABS tablet Take 1 tablet by mouth daily.   Omega-3 Fatty Acids (FISH OIL ODOR-LESS PO) Take by mouth.   rosuvastatin (CRESTOR) 5 MG tablet  Take 1 tablet (5 mg total) by mouth daily.   VITAMIN A PO Take by mouth.   ZINC PICOLINATE PO Take by mouth.   No facility-administered medications prior to visit.    Review of Systems  Constitutional:  Negative for fatigue and fever.  Respiratory:  Negative for cough and shortness of breath.   Cardiovascular:  Negative for chest pain and leg swelling.  Gastrointestinal:  Negative for abdominal pain.  Genitourinary:  Positive for dysuria, frequency and hematuria.  Neurological:  Negative for dizziness and headaches.      Objective    BP (!) 141/56 (BP Location: Left Arm, Patient Position: Sitting, Cuff Size: Normal)   Pulse 67   Temp 98.3 F (36.8 C) (Temporal)   Resp 12   Ht 5\' 3"  (1.6 m)   Wt 150 lb 8 oz (68.3 kg)   SpO2 99%   BMI 26.66 kg/m    Physical Exam Constitutional:      General: She is awake.     Appearance: She is well-developed.  HENT:     Head: Normocephalic.  Eyes:     Conjunctiva/sclera: Conjunctivae normal.  Cardiovascular:     Rate and Rhythm: Normal rate and regular rhythm.     Heart sounds: Normal heart sounds.  Pulmonary:     Effort: Pulmonary effort is normal.     Breath sounds: Normal breath sounds.  Abdominal:     General: There is no distension.     Palpations: Abdomen is soft.  Tenderness: There is no abdominal tenderness. There is no right CVA tenderness, left CVA tenderness or guarding.  Skin:    General: Skin is warm.  Neurological:     Mental Status: She is alert and oriented to person, place, and time.  Psychiatric:        Attention and Perception: Attention normal.        Mood and Affect: Mood normal.        Speech: Speech normal.        Behavior: Behavior is cooperative.      No results found for any visits on 12/02/22.  Assessment & Plan     Assessment and Plan    Hematuria and Dysuria: Recurrent symptoms after recent UTI treatment. No current pain or visible blood. History of kidney stones. --culture ordered, UA  + leuks, blood. - nitrites.  --ordered urine micro.  Bowel Movements: Multiple bowel movements in the morning, but none throughout the rest of the day. No associated pain. -Consider fiber supplement such as Benefiber or Metamucil to help bulk stool and potentially reduce frequency of bowel movements.       Return if symptoms worsen or fail to improve.      I, Alfredia Ferguson, PA-C have reviewed all documentation for this visit. The documentation on  12/02/22   for the exam, diagnosis, procedures, and orders are all accurate and complete.  Alfredia Ferguson, PA-C [facility] [address] [place], [place], [address] Office: [phone] Fax: [phone]   [facility]

## 2022-12-03 ENCOUNTER — Telehealth: Payer: Self-pay | Admitting: Physician Assistant

## 2022-12-03 ENCOUNTER — Encounter: Payer: Self-pay | Admitting: Physician Assistant

## 2022-12-03 LAB — URINALYSIS, MICROSCOPIC ONLY
Casts: NONE SEEN /lpf
RBC, Urine: NONE SEEN /hpf (ref 0–2)

## 2022-12-03 NOTE — Telephone Encounter (Signed)
Pt called in about lab results, not notes on the account yet for nurse to give.

## 2022-12-04 ENCOUNTER — Other Ambulatory Visit: Payer: Self-pay | Admitting: Physician Assistant

## 2022-12-04 ENCOUNTER — Ambulatory Visit: Payer: Self-pay

## 2022-12-04 DIAGNOSIS — N3001 Acute cystitis with hematuria: Secondary | ICD-10-CM

## 2022-12-04 LAB — URINE CULTURE

## 2022-12-04 MED ORDER — SULFAMETHOXAZOLE-TRIMETHOPRIM 800-160 MG PO TABS: 1.0000 | ORAL_TABLET | Freq: Two times a day (BID) | ORAL | 0 refills | Status: DC

## 2022-12-04 NOTE — Telephone Encounter (Signed)
Reason for Disposition  [1] Taking antibiotic > 72 hours (3 days) for UTI AND [2] painful urination or frequency is SAME (unchanged, not better)    Alfredia Ferguson PA-C sent pt a MyChart message that she just sent in a different antibiotic to her pharmacy.   No further triage needed.  Answer Assessment - Initial Assessment Questions 1. MAIN SYMPTOM: "What is the main symptom you are concerned about?" (e.g., painful urination, urine frequency)     The medication did not work.   I was in 2 days ago.   I'm still having UTI symptoms. I just got a message from Alfredia Ferguson, PA-C that she has called in another antibiotic for me via my MyChart. No other questions or needs so no triage  completed.    She thanked me for calling her back. 2. BETTER-SAME-WORSE: "Are you getting better, staying the same, or getting worse compared to how you felt at your last visit to the doctor (most recent medical visit)?"     No   I'm still having symptoms even after completing the antibiotic. 3. PAIN: "How bad is the pain?"  (e.g., Scale 1-10; mild, moderate, or severe)   - MILD (1-3): complains slightly about urination hurting   - MODERATE (4-7): interferes with normal activities     - SEVERE (8-10): excruciating, unwilling or unable to urinate because of the pain       4. FEVER: "Do you have a fever?" If Yes, ask: "What is it, how was it measured, and when did it start?"      5. OTHER SYMPTOMS: "Do you have any other symptoms?" (e.g., blood in the urine, flank pain, vaginal discharge)      6. DIAGNOSIS: "When was the UTI diagnosed?" "By whom?" "Was it a kidney infection, bladder infection or both?"      7. ANTIBIOTIC: "What antibiotic(s) are you taking?" "How many times per day?"      8. ANTIBIOTIC - START DATE: "When did you start taking the antibiotic?"  Protocols used: Urinary Tract Infection on Antibiotic Follow-up Call [facility]  Chief Complaint: No triage needed.   Pt just received a MyChart message  from Alfredia Ferguson that another antibiotic has been called in for her.    Symptoms: Same symptoms not helped with antibiotic Frequency: N/A Pertinent Negatives: Patient denies N/A Disposition: [] ED /[] Urgent Care (no appt availability in office) / [] Appointment(In office/virtual)/ []  [place] [facility]/ [] Home Care/ [] Refused Recommended Disposition /[] [place] [facility]/ [x]  Follow-up with PCP Additional Notes: Pt received MyChart message that Alfredia Ferguson, PA-C had called in a different antibiotic for her.    No triage needed.

## 2022-12-04 NOTE — Telephone Encounter (Signed)
Patient called, left VM to return the call to the office to speak to the NT.   Summary: UTI since June 26   Nicole Tucker states she has had an UTI since June 26, she has completed her medication and still has symptoms, especially pain while urinating.

## 2022-12-06 LAB — URINE CULTURE

## 2023-01-03 ENCOUNTER — Encounter: Payer: Self-pay | Admitting: Physician Assistant

## 2023-01-04 ENCOUNTER — Other Ambulatory Visit: Payer: Self-pay | Admitting: Physician Assistant

## 2023-01-04 DIAGNOSIS — E1169 Type 2 diabetes mellitus with other specified complication: Secondary | ICD-10-CM

## 2023-01-04 DIAGNOSIS — I10 Essential (primary) hypertension: Secondary | ICD-10-CM

## 2023-01-05 NOTE — Telephone Encounter (Signed)
Requested medications are due for refill today. Statin - yes, Htn med  - see note  Requested medications are on the active medications list.  yes  Last refill. Statin 01/07/2022 #90 3 rf  Future visit scheduled.   yes  Notes to clinic.  Nicole Tucker listed as PCP. Lisinopril is on med list both alone and in combination medication  - please advise.    Requested Prescriptions  Pending Prescriptions Disp Refills   lisinopril-hydrochlorothiazide (ZESTORETIC) 20-25 MG tablet [Pharmacy Med Name: LISINOPRIL/HCTZ TABS 20/25MG ] 90 tablet 3    Sig: TAKE 1 TABLET DAILY     Cardiovascular:  ACEI + Diuretic Combos Failed - 01/04/2023  1:48 AM      Failed - Na in normal range and within 180 days    Sodium  Date Value Ref Range Status  02/02/2022 136 135 - 145 mmol/L Final  01/06/2022 142 134 - 144 mmol/L Final  12/26/2011 140 136 - 145 mmol/L Final         Failed - K in normal range and within 180 days    Potassium  Date Value Ref Range Status  02/02/2022 3.3 (L) 3.5 - 5.1 mmol/L Final  12/26/2011 3.6 3.5 - 5.1 mmol/L Final         Failed - Cr in normal range and within 180 days    Creatinine  Date Value Ref Range Status  12/26/2011 0.76 0.60 - 1.30 mg/dL Final   Creatinine, Ser  Date Value Ref Range Status  02/02/2022 0.61 0.44 - 1.00 mg/dL Final         Failed - eGFR is 30 or above and within 180 days    EGFR (African American)  Date Value Ref Range Status  12/26/2011 >60  Final   GFR calc Af Amer  Date Value Ref Range Status  04/10/2019 85 >59 mL/min/1.73 Final   EGFR (Non-African Amer.)  Date Value Ref Range Status  12/26/2011 >60  Final    Comment:    eGFR values <53mL/min/1.73 m2 may be an indication of chronic kidney disease (CKD). Calculated eGFR is useful in patients with stable renal function. The eGFR calculation will not be reliable in acutely ill patients when serum creatinine is changing rapidly. It is not useful in  patients on dialysis. The eGFR  calculation may not be applicable to patients at the low and high extremes of body sizes, pregnant women, and vegetarians.    GFR, Estimated  Date Value Ref Range Status  02/02/2022 >60 >60 mL/min Final    Comment:    (NOTE) Calculated using the CKD-EPI Creatinine Equation (2021)    eGFR  Date Value Ref Range Status  01/06/2022 70 >59 mL/min/1.73 Final         Failed - Last BP in normal range    BP Readings from Last 1 Encounters:  12/02/22 (!) 141/56         Passed - Patient is not pregnant      Passed - Valid encounter within last 6 months    Recent Outpatient Visits           1 month ago Dysuria   [place] [facility], [place], PA-C   1 month ago Acute cystitis with hematuria   [place] [facility] Simmons-Robinson, [place], MD   9 months ago Hypertension associated with diabetes [facility])   [facility] Nicole Ferguson, PA-C   1 year ago Benign essential HTN   [place] [facility] Nicole Ferguson, PA-C   2 years ago Bilateral impacted cerumen   [place] [facility], Lauren A, NP               rosuvastatin (CRESTOR) 5 MG tablet [Pharmacy Med Name: ROSUVASTATIN TABS 5MG ] 90 tablet 3    Sig: TAKE 1 TABLET DAILY     Cardiovascular:  Antilipid - Statins 2 Failed - 01/04/2023  1:48 AM      Failed - Lipid Panel in normal range within the last 12 months    Cholesterol, Total  Date Value Ref Range Status  01/06/2022 226 (H) 100 - 199 mg/dL Final   LDL Chol Calc (NIH)  Date Value Ref Range Status  01/06/2022 134 (H) 0 - 99 mg/dL Final   HDL  Date Value Ref Range Status  01/06/2022 71 >39 mg/dL Final   Triglycerides  Date Value Ref Range Status  01/06/2022 122 0 - 149 mg/dL Final         Passed - Cr in normal range and within 360 days    Creatinine  Date Value Ref Range Status  12/26/2011 0.76 0.60 - 1.30 mg/dL Final   Creatinine, Ser  Date  Value Ref Range Status  02/02/2022 0.61 0.44 - 1.00 mg/dL Final         Passed - Patient is not pregnant      Passed - Valid encounter within last 12 months    Recent Outpatient Visits           1 month ago Dysuria   [place] [facility] Nicole Ferguson, PA-C   1 month ago Acute cystitis with hematuria   [place] [facility], [place], MD   9 months ago Hypertension associated with diabetes [facility])   [place] [facility] Nicole Ferguson, PA-C   1 year ago Benign essential HTN   [place] [facility] Nicole Ferguson, PA-C   2 years ago Bilateral impacted cerumen   [place] [facility] Norval Morton, Jake Church, NP

## 2023-01-13 ENCOUNTER — Telehealth: Payer: Self-pay

## 2023-01-13 NOTE — Telephone Encounter (Signed)
Copied from CRM [id number]. Topic: Appointment Scheduling - Scheduling Inquiry for Clinic >> Jan 07, 2023 10:38 AM Nicole Tucker T wrote: Reason for CRM: patient requesting to change her wellness visit from Aug 26 to mid Sept. Unable to reschedule. Please f/u with patient >> Jan 12, 2023 10:04 AM Reeves Forth wrote: CRM routed to incorrect pool - rerouting

## 2023-01-18 ENCOUNTER — Ambulatory Visit: Payer: Medicare Other | Admitting: Family Medicine

## 2023-02-17 ENCOUNTER — Encounter: Payer: Self-pay | Admitting: Family Medicine

## 2023-02-17 ENCOUNTER — Ambulatory Visit: Payer: Medicare Other | Admitting: Family Medicine

## 2023-02-17 VITALS — BP 149/68 | HR 57 | Temp 97.5°F | Ht 63.0 in | Wt 149.8 lb

## 2023-02-17 DIAGNOSIS — D582 Other hemoglobinopathies: Secondary | ICD-10-CM

## 2023-02-17 DIAGNOSIS — R7303 Prediabetes: Secondary | ICD-10-CM

## 2023-02-17 DIAGNOSIS — I10 Essential (primary) hypertension: Secondary | ICD-10-CM | POA: Diagnosis not present

## 2023-02-17 DIAGNOSIS — N182 Chronic kidney disease, stage 2 (mild): Secondary | ICD-10-CM | POA: Insufficient documentation

## 2023-02-17 DIAGNOSIS — Z0000 Encounter for general adult medical examination without abnormal findings: Secondary | ICD-10-CM

## 2023-02-17 DIAGNOSIS — H6123 Impacted cerumen, bilateral: Secondary | ICD-10-CM | POA: Insufficient documentation

## 2023-02-17 NOTE — Progress Notes (Signed)
Annual Wellness Visit     Patient: Nicole Tucker, Female    DOB: 05/28/1945, 77 y.o.   MRN: [id number] Visit Date: 02/17/2023  Today's Provider: Sherlyn Hay, DO   No chief complaint on file.  Subjective    Nicole Tucker is a 77 y.o. female who presents today for her Annual Wellness Visit. She reports consuming a general and fairly healthy  diet. She does like lightly salted potato chips sometimes as well.  The patient does not participate in regular exercise at present. 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  She generally feels well. She reports sleeping fairly well. She does have additional problems to discuss today.   HPI Last eye exam?  About three years ago  Ears seem to plug up when she lays down at night  - if she pulls on them, they clear up.  - has had issues with the same in the past  Patient will be calling to schedule her mammogram.   Medications: Outpatient Medications Prior to Visit  Medication Sig   Ascorbic Acid (VITAMIN C PO) Take by mouth daily.   B Complex Vitamins (VITAMIN B COMPLEX PO) Take by mouth daily.   BIOTIN PO Take by mouth.   Calcium Carb-Cholecalciferol 333-3.325 MG-MCG TABS Take by mouth.   Cholecalciferol (VITAMIN D) 2000 units CAPS Take by mouth daily.    COLLAGEN PO Take by mouth.   Garlic 1500 MG CAPS Take 3,000 mg by mouth.   Iron Combinations (IRON COMPLEX PO) Take by mouth.   lisinopril (ZESTRIL) 10 MG tablet Take 1 tablet (10 mg total) by mouth daily. Please schedule office visit before any future refill.   lisinopril-hydrochlorothiazide (ZESTORETIC) 20-25 MG tablet TAKE 1 TABLET DAILY   Multiple Vitamin (MULTIVITAMIN WITH MINERALS) TABS tablet Take 1 tablet by mouth daily.   Omega-3 Fatty Acids (FISH OIL ODOR-LESS PO) Take by mouth.   rosuvastatin (CRESTOR) 5 MG tablet TAKE 1 TABLET DAILY   VITAMIN A PO Take by mouth.   ZINC PICOLINATE PO Take by mouth.   [DISCONTINUED]  sulfamethoxazole-trimethoprim (BACTRIM DS) 800-160 MG tablet Take 1 tablet by mouth 2 (two) times daily. (Patient not taking: Reported on 02/17/2023)   No facility-administered medications prior to visit.    Allergies  Allergen Reactions   Codeine Itching and Anxiety    Other reaction(s): Other (See Comments) Nervous    Patient Care Team: Kaylen Motl, Monico Blitz, DO as PCP - General (Family Medicine) Gilda Crease, Latina Craver, MD as Consulting Physician (Vascular Surgery) Debbrah Alar, MD as Consulting Physician (Dermatology) Pa, [facility] Od as Consulting Physician  Review of Systems  Constitutional:  Negative for chills, fatigue and fever.  HENT:  Negative for congestion, ear pain, rhinorrhea, sneezing and sore throat.   Eyes: Negative.  Negative for pain and redness.  Respiratory:  Negative for cough, shortness of breath and wheezing.   Cardiovascular:  Negative for chest pain and leg swelling.  Gastrointestinal:  Negative for abdominal pain, blood in stool, constipation, diarrhea and nausea.  Endocrine: Negative for polydipsia and polyphagia.  Genitourinary: Negative.  Negative for dysuria, flank pain, hematuria, pelvic pain, vaginal bleeding and vaginal discharge.  Musculoskeletal:  Negative for arthralgias, back pain, gait problem and joint swelling.  Skin:  Negative for rash.  Neurological:  Positive for numbness (5th toes bilaterally). Negative for dizziness, tremors, seizures, weakness, light-headedness and headaches.  Hematological:  Negative for adenopathy.  Psychiatric/Behavioral: Negative.  Negative for behavioral problems, confusion and dysphoric mood. The patient is not nervous/anxious and is not hyperactive.          Objective    Vitals: BP (!) 149/68 (BP Location: Left Arm, Patient Position: Sitting, Cuff Size: Normal)   Pulse (!) 57   Temp (!) 97.5 F (36.4 C) (Oral)   Ht 5\' 3"  (1.6 m)   Wt 149 lb 12.8 oz (67.9 kg)   SpO2 100%   BMI 26.54 kg/m       Physical Exam Vitals and nursing note reviewed.  Constitutional:      General: She is not in acute distress.    Appearance: Normal appearance.  HENT:     Head: Normocephalic and atraumatic.     Right Ear: There is impacted cerumen.     Left Ear: There is impacted cerumen.     Nose: Nose normal.     Mouth/Throat:     Mouth: Mucous membranes are moist.     Pharynx: Oropharynx is clear. No oropharyngeal exudate or posterior oropharyngeal erythema.  Eyes:     General: No scleral icterus.    Extraocular Movements: Extraocular movements intact.     Conjunctiva/sclera: Conjunctivae normal.  Cardiovascular:     Rate and Rhythm: Normal rate and regular rhythm.     Pulses: Normal pulses.     Heart sounds: Normal heart sounds.  Pulmonary:     Effort: Pulmonary effort is normal. No respiratory distress.     Breath sounds: Normal breath sounds.  Abdominal:     General: Bowel sounds are normal. There is no distension.     Palpations: Abdomen is soft. There is no mass.     Tenderness: There is no abdominal tenderness. There is no guarding.  Musculoskeletal:     Right lower leg: No edema.     Left lower leg: No edema.  Skin:    General: Skin is warm and dry.  Neurological:     Mental Status: She is alert and oriented to person, place, and time. Mental status is at baseline.  Psychiatric:        Mood and Affect: Mood normal.        Behavior: Behavior normal.      Most recent functional status assessment:    11/18/2022    2:50 PM  In your present state of health, do you have any difficulty performing the following activities:  Hearing? 0  Vision? 0  Difficulty concentrating or making decisions? 0  Walking or climbing stairs? 0  Dressing or bathing? 0  Doing errands, shopping? 0   Most recent fall risk assessment:    02/17/2023   10:34 AM  Fall Risk   Falls in the past year? 1  Number falls in past yr: 0  Injury with Fall? 1  Follow up Falls prevention discussed     Most recent depression screenings:    02/17/2023   10:24 AM 11/18/2022    2:50 PM  PHQ 2/9 Scores  PHQ - 2 Score 0 0   Most recent cognitive screening:    02/17/2023   10:34 AM  6CIT Screen  What Year? 0 points  What month? 0 points  What time? 0 points  Count back from 20 0 points  Months in reverse 0 points  Repeat phrase 6 points  Total Score 6 points   Most recent Audit-C alcohol use screening    11/18/2022    2:50 PM  Alcohol Use Disorder Test (AUDIT)  1.  How often do you have a drink containing alcohol? 4  2. How many drinks containing alcohol do you have on a typical day when you are drinking? 1  3. How often do you have six or more drinks on one occasion? 0  AUDIT-C Score 5   A score of 3 or more in women, and 4 or more in men indicates increased risk for alcohol abuse, EXCEPT if all of the points are from question 1   No results found for any visits on 02/17/23.  Assessment & Plan     Annual wellness visit done today including the all of the following: Reviewed patient's Family Medical History Reviewed and updated list of patient's medical providers Assessment of cognitive impairment was done Assessed patient's functional ability Established a written schedule for health screening services Health Risk Assessent Completed and Reviewed Patient is not currently on any opioid medications.  Exercise Activities and Dietary recommendations  Goals      DIET - EAT MORE FRUITS AND VEGETABLES     Exercise 3x per week (30 min per time)     Recommend increasing exercise to walking 3 days a week for 30 minutes.        Immunization History  Administered Date(s) Administered   Pneumococcal Conjugate-13 09/10/2015   Pneumococcal Polysaccharide-23 10/27/2016   Tdap 10/14/2009    Health Maintenance  Topic Date Due   Zoster Vaccines- Shingrix (1 of 2) Never done   DTaP/Tdap/Td (2 - Td or Tdap) 10/15/2019   OPHTHALMOLOGY EXAM  11/19/2021   MAMMOGRAM   12/25/2021   HEMOGLOBIN A1C  07/09/2022   FOOT EXAM  01/07/2023   Diabetic kidney evaluation - eGFR measurement  02/03/2023   INFLUENZA VACCINE  08/23/2023 (Originally 12/24/2022)   COVID-19 Vaccine (1 - 2023-24 season) 08/23/2023 (Originally 01/24/2023)   Diabetic kidney evaluation - Urine ACR  11/18/2023   Medicare Annual Wellness (AWV)  02/17/2024   DEXA SCAN  09/09/2026   Colonoscopy  08/26/2031   Pneumonia Vaccine 11+ Years old  Completed   Hepatitis C Screening  Completed   HPV VACCINES  Aged Out     Discussed health benefits of physical activity, and encouraged her to engage in regular exercise appropriate for her age and condition.    Encounter for subsequent annual wellness visit (AWV) in Medicare patient  Primary hypertension Assessment & Plan: Blood pressure mildly elevated today. Will continue lisinopril 10 mg and lisinopril-hydrochlorothiazide 20-25 milligrams, both daily.  Will recheck on next visit and adjust medications if indicated at that time.  Orders: -     Microalbumin / creatinine urine ratio -     Comprehensive metabolic panel -     Lipid panel  Chronic kidney disease, stage 2 (mild) Assessment & Plan: No acute concerns Will monitor   Prediabetes Assessment & Plan: No acute concerns.  Continue to monitor.  Orders: -     Hemoglobin A1c  Impacted cerumen, bilateral Assessment & Plan: Patient will be obtaining Debrox over-the-counter.  Instructions for administration given.  Advised patient to start it not more than 4 days before her appointment.   Elevated hemoglobin (HCC) Assessment & Plan: No acute concerns.  Will recheck on next visit.   Labs are fasting today (pt has not eaten)   Return in about 1 week (around 02/24/2023) for ear irrigation.     I discussed the assessment and treatment plan with the patient  The patient was provided an opportunity to ask questions and all were answered. The patient  agreed with the plan and demonstrated an  understanding of the instructions.   The patient was advised to call back or seek an in-person evaluation if the symptoms worsen or if the condition fails to improve as anticipated.    Sherlyn Hay, DO  [facility] [phone] (phone) [phone] (fax)  [facility]

## 2023-02-17 NOTE — Assessment & Plan Note (Signed)
Patient will be obtaining Debrox over-the-counter.  Instructions for administration given.  Advised patient to start it not more than 4 days before her appointment.

## 2023-02-17 NOTE — Patient Instructions (Addendum)
Check with insurance regarding coverage of Shingles vaccine and Tdap vaccine (getting it at the pharmacy vs. at clinic).  Remember to schedule your mammogram.  Pick up debrox for your ears   - place 5 drops into both ears twice a day for 4 days (with last dose being before follow-up appointment.

## 2023-02-17 NOTE — Addendum Note (Signed)
Addended byJacquenette Shone on: 02/17/2023 01:00 PM   Modules accepted: Level of Service

## 2023-02-17 NOTE — Assessment & Plan Note (Signed)
No acute concerns.  Will recheck on next visit.

## 2023-02-17 NOTE — Assessment & Plan Note (Signed)
Blood pressure mildly elevated today. Will continue lisinopril 10 mg and lisinopril-hydrochlorothiazide 20-25 milligrams, both daily.  Will recheck on next visit and adjust medications if indicated at that time.

## 2023-02-17 NOTE — Assessment & Plan Note (Signed)
No acute concerns Will monitor

## 2023-02-17 NOTE — Assessment & Plan Note (Signed)
No acute concerns.  Continue to monitor.

## 2023-02-18 LAB — COMPREHENSIVE METABOLIC PANEL
ALT: 24 IU/L (ref 0–32)
AST: 29 IU/L (ref 0–40)
Albumin: 4.7 g/dL (ref 3.8–4.8)
Alkaline Phosphatase: 65 IU/L (ref 44–121)
BUN/Creatinine Ratio: 9 — ABNORMAL LOW (ref 12–28)
BUN: 8 mg/dL (ref 8–27)
Bilirubin Total: 1.3 mg/dL — ABNORMAL HIGH (ref 0.0–1.2)
CO2: 24 mmol/L (ref 20–29)
Calcium: 10.6 mg/dL — ABNORMAL HIGH (ref 8.7–10.3)
Chloride: 98 mmol/L (ref 96–106)
Creatinine, Ser: 0.87 mg/dL (ref 0.57–1.00)
Globulin, Total: 2.6 g/dL (ref 1.5–4.5)
Glucose: 101 mg/dL — ABNORMAL HIGH (ref 70–99)
Potassium: 4.7 mmol/L (ref 3.5–5.2)
Sodium: 140 mmol/L (ref 134–144)
Total Protein: 7.3 g/dL (ref 6.0–8.5)
eGFR: 70 mL/min/{1.73_m2} (ref >59)

## 2023-02-18 LAB — LIPID PANEL
Chol/HDL Ratio: 4.3 ratio (ref 0.0–4.4)
Cholesterol, Total: 228 mg/dL — ABNORMAL HIGH (ref 100–199)
HDL: 53 mg/dL (ref >39)
LDL Chol Calc (NIH): 154 mg/dL — ABNORMAL HIGH (ref 0–99)
Triglycerides: 120 mg/dL (ref 0–149)
VLDL Cholesterol Cal: 21 mg/dL (ref 5–40)

## 2023-02-18 LAB — MICROALBUMIN / CREATININE URINE RATIO
Creatinine, Urine: 49.7 mg/dL
Microalb/Creat Ratio: 8 mg/g creat (ref 0–29)
Microalbumin, Urine: 4.1 ug/mL

## 2023-02-18 LAB — HEMOGLOBIN A1C
Est. average glucose Bld gHb Est-mCnc: 120 mg/dL
Hgb A1c MFr Bld: 5.8 % — ABNORMAL HIGH (ref 4.8–5.6)

## 2023-02-24 ENCOUNTER — Encounter: Payer: Self-pay | Admitting: Family Medicine

## 2023-02-24 ENCOUNTER — Other Ambulatory Visit: Payer: Self-pay | Admitting: Family Medicine

## 2023-02-24 ENCOUNTER — Ambulatory Visit: Payer: Medicare Other | Admitting: Family Medicine

## 2023-02-24 VITALS — BP 137/57 | HR 65 | Resp 16 | Ht 63.0 in

## 2023-02-24 DIAGNOSIS — H6243 Otitis externa in other diseases classified elsewhere, bilateral: Secondary | ICD-10-CM | POA: Diagnosis not present

## 2023-02-24 DIAGNOSIS — H6123 Impacted cerumen, bilateral: Secondary | ICD-10-CM | POA: Diagnosis not present

## 2023-02-24 DIAGNOSIS — B369 Superficial mycosis, unspecified: Secondary | ICD-10-CM

## 2023-02-24 MED ORDER — HYDROCORTISONE-ACETIC ACID 1-2 % OT SOLN: 5.0000 [drp] | Freq: Three times a day (TID) | OTIC | 0 refills | Status: DC

## 2023-02-24 MED ORDER — CLOTRIMAZOLE 1 % EX SOLN: 1.0000 | Freq: Two times a day (BID) | CUTANEOUS | 0 refills | Status: AC

## 2023-02-24 NOTE — Assessment & Plan Note (Addendum)
Debrox applied by patient at home. Bilateral ear canals irrigated; patient tolerated procedure well.   Debris manually pulled from the ear appears more consistent with fungus than cerumen.

## 2023-02-24 NOTE — Progress Notes (Signed)
Established patient visit   Patient: Nicole Tucker   DOB: 1945/12/25   77 y.o. Female  MRN: [id number] Visit Date: 02/24/2023  Today's healthcare provider: Sherlyn Hay, DO   Chief Complaint  Patient presents with   Follow-up    R ear no hearing now   Subjective    HPI Patient returning for bilateral irrigation of her ears due to cerumen impaction.  She applied Debrox drops for the past several days at home.  Can't hear out of right ear; left ear - can hear.     Medications: Outpatient Medications Prior to Visit  Medication Sig   Ascorbic Acid (VITAMIN C PO) Take by mouth daily.   B Complex Vitamins (VITAMIN B COMPLEX PO) Take by mouth daily.   BIOTIN PO Take by mouth.   Calcium Carb-Cholecalciferol 333-3.325 MG-MCG TABS Take by mouth.   Cholecalciferol (VITAMIN D) 2000 units CAPS Take by mouth daily.    COLLAGEN PO Take by mouth.   Garlic 1500 MG CAPS Take 3,000 mg by mouth.   Iron Combinations (IRON COMPLEX PO) Take by mouth.   lisinopril (ZESTRIL) 10 MG tablet Take 1 tablet (10 mg total) by mouth daily. Please schedule office visit before any future refill.   lisinopril-hydrochlorothiazide (ZESTORETIC) 20-25 MG tablet TAKE 1 TABLET DAILY   Multiple Vitamin (MULTIVITAMIN WITH MINERALS) TABS tablet Take 1 tablet by mouth daily.   Omega-3 Fatty Acids (FISH OIL ODOR-LESS PO) Take by mouth.   rosuvastatin (CRESTOR) 5 MG tablet TAKE 1 TABLET DAILY   VITAMIN A PO Take by mouth.   ZINC PICOLINATE PO Take by mouth.   No facility-administered medications prior to visit.    Review of Systems  HENT:  Positive for hearing loss. Negative for ear discharge and ear pain.           Objective    BP (!) 137/57   Pulse 65   Resp 16   Ht 5\' 3"  (1.6 m)   SpO2 100%   BMI 26.54 kg/m     Physical Exam HENT:     Right Ear: External ear normal. Decreased hearing noted. There is impacted cerumen (cerumen much-softer than previously).     Left Ear: Hearing and  external ear normal. There is impacted cerumen (still significant quantity of cerumen present, softer appearance; hearing improved.).     AFTER Irrigation: Right Ear: Able to visualize tympanic membrane, which is intact.  Ear canal.  Ear canal is reddened.  Mild amount of debris remains.  Debris manually pulled from the ear appears more consistent with fungus than cerumen. Left Ear: Able to visualize tympanic membrane, which is intact.  Ear canal.  Ear canal is reddened.  Mild amount of debris remains.  Debris manually pulled from the ear appears more consistent with fungus than cerumen.   No results found for any visits on 02/24/23.  Assessment & Plan    Impacted cerumen, bilateral Assessment & Plan: Debrox applied by patient at home. Bilateral ear canals irrigated; patient tolerated procedure well.   Debris manually pulled from the ear appears more consistent with fungus than cerumen.  Orders: -     Ear Lavage -     Ambulatory referral to ENT  Otitis externa, fungal, both ears Assessment & Plan: Prescribed patient acetic acid/hydrocortisone otic drops to reduce possibility of your environment to fungal organisms. Will refer to ENT for further evaluation given chronic and recurrent nature  Orders: -     Hydrocortisone-Acetic Acid;  Place 5 drops into both ears 3 (three) times daily.  Dispense: 10 mL; Refill: 0 -     Ambulatory referral to ENT   Ear Cerumen Removal  Date/Time: 02/24/2023 1:40 PM  Performed by: Sherlyn Hay, DO Authorized by: Sherlyn Hay, DO   Anesthesia: Local Anesthetic: none Ceruminolytics applied: Ceruminolytics applied prior to the procedure. Location details: right ear and left ear Patient tolerance: patient tolerated the procedure well with no immediate complications Procedure type: irrigation  Sedation: Patient sedated: no    Ear Cerumen Removal  Date/Time: 02/24/2023 1:50 PM  Performed by: Sherlyn Hay, DO Authorized by: Sherlyn Hay, DO   Anesthesia: Local Anesthetic: none Ceruminolytics applied: Ceruminolytics applied prior to the procedure. Location details: right ear and left ear Patient tolerance: patient tolerated the procedure well with no immediate complications Procedure type: curette  Sedation: Patient sedated: no     Return if symptoms worsen or fail to improve.      I discussed the assessment and treatment plan with the patient  The patient was provided an opportunity to ask questions and all were answered. The patient agreed with the plan and demonstrated an understanding of the instructions.   The patient was advised to call back or seek an in-person evaluation if the symptoms worsen or if the condition fails to improve as anticipated.    Sherlyn Hay, DO  [facility] [phone] (phone) [phone] (fax)  [facility]

## 2023-02-24 NOTE — Addendum Note (Signed)
Addended by: Jacquenette Shone on: 02/24/2023 04:50 PM   Modules accepted: Orders

## 2023-02-24 NOTE — Assessment & Plan Note (Signed)
Prescribed patient acetic acid/hydrocortisone otic drops to reduce possibility of your environment to fungal organisms. Will refer to ENT for further evaluation given chronic and recurrent nature

## 2023-02-26 ENCOUNTER — Other Ambulatory Visit: Payer: Self-pay | Admitting: Family Medicine

## 2023-02-26 DIAGNOSIS — Z1231 Encounter for screening mammogram for malignant neoplasm of breast: Secondary | ICD-10-CM

## 2023-03-01 ENCOUNTER — Encounter: Payer: Self-pay | Admitting: Family Medicine

## 2023-03-02 NOTE — Telephone Encounter (Signed)
Dr. Payton Mccallum patient. Can you review her labs please.

## 2023-03-10 DIAGNOSIS — H6123 Impacted cerumen, bilateral: Secondary | ICD-10-CM | POA: Diagnosis not present

## 2023-03-17 ENCOUNTER — Ambulatory Visit
Admission: RE | Admit: 2023-03-17 | Discharge: 2023-03-17 | Disposition: A | Discharge status: Home / Self Care | Payer: Medicare Other | Source: Ambulatory Visit | Attending: Family Medicine | Admitting: Family Medicine

## 2023-03-17 DIAGNOSIS — Z1231 Encounter for screening mammogram for malignant neoplasm of breast: Secondary | ICD-10-CM | POA: Diagnosis present

## 2023-03-22 ENCOUNTER — Encounter: Payer: Self-pay | Admitting: Family Medicine

## 2023-03-23 ENCOUNTER — Other Ambulatory Visit: Payer: Self-pay

## 2023-03-24 ENCOUNTER — Other Ambulatory Visit: Payer: Medicare Other

## 2023-03-25 LAB — COMPREHENSIVE METABOLIC PANEL
ALT: 15 [IU]/L (ref 0–32)
AST: 22 [IU]/L (ref 0–40)
Albumin: 4.4 g/dL (ref 3.8–4.8)
Alkaline Phosphatase: 64 [IU]/L (ref 44–121)
BUN/Creatinine Ratio: 11 — ABNORMAL LOW (ref 12–28)
BUN: 9 mg/dL (ref 8–27)
Bilirubin Total: 1.2 mg/dL (ref 0.0–1.2)
CO2: 24 mmol/L (ref 20–29)
Calcium: 9.7 mg/dL (ref 8.7–10.3)
Chloride: 97 mmol/L (ref 96–106)
Creatinine, Ser: 0.81 mg/dL (ref 0.57–1.00)
Globulin, Total: 2.7 g/dL (ref 1.5–4.5)
Glucose: 91 mg/dL (ref 70–99)
Potassium: 4.5 mmol/L (ref 3.5–5.2)
Sodium: 139 mmol/L (ref 134–144)
Total Protein: 7.1 g/dL (ref 6.0–8.5)
eGFR: 76 mL/min/{1.73_m2} (ref >59)

## 2023-04-06 ENCOUNTER — Other Ambulatory Visit: Payer: Self-pay | Admitting: Physician Assistant

## 2023-04-06 DIAGNOSIS — E1169 Type 2 diabetes mellitus with other specified complication: Secondary | ICD-10-CM

## 2023-04-06 DIAGNOSIS — I10 Essential (primary) hypertension: Secondary | ICD-10-CM

## 2023-04-07 NOTE — Telephone Encounter (Signed)
Requested Prescriptions  Pending Prescriptions Disp Refills   rosuvastatin (CRESTOR) 5 MG tablet [Pharmacy Med Name: ROSUVASTATIN TABS 5MG ] 90 tablet 3    Sig: TAKE 1 TABLET DAILY     Cardiovascular:  Antilipid - Statins 2 Failed - 04/06/2023  1:19 AM      Failed - Lipid Panel in normal range within the last 12 months    Cholesterol, Total  Date Value Ref Range Status  02/17/2023 228 (H) 100 - 199 mg/dL Final   LDL Chol Calc (NIH)  Date Value Ref Range Status  02/17/2023 154 (H) 0 - 99 mg/dL Final   HDL  Date Value Ref Range Status  02/17/2023 53 >39 mg/dL Final   Triglycerides  Date Value Ref Range Status  02/17/2023 120 0 - 149 mg/dL Final         Passed - Cr in normal range and within 360 days    Creatinine  Date Value Ref Range Status  12/26/2011 0.76 0.60 - 1.30 mg/dL Final   Creatinine, Ser  Date Value Ref Range Status  03/24/2023 0.81 0.57 - 1.00 mg/dL Final         Passed - Patient is not pregnant      Passed - Valid encounter within last 12 months    Recent Outpatient Visits           1 month ago Impacted cerumen, bilateral   [place] [facility], Monico Blitz, DO   1 month ago Encounter for subsequent annual wellness visit (AWV) in Medicare patient   [facility] [place], Monico Blitz, DO   4 months ago Dysuria   [place] [facility] Alfredia Ferguson, PA-C   4 months ago Acute cystitis with hematuria   [place] [facility] Simmons[place]Robinson, [place], MD   1 year ago Hypertension associated with diabetes [facility])   [place] [facility] [place], Lillia Abed, PA-C               lisinopril-hydrochlorothiazide (ZESTORETIC) 20-25 MG tablet [Pharmacy Med Name: LISINOPRIL/HYDROCHLOROTHIAZIDE TABS 20/25MG ] 90 tablet 3    Sig: TAKE 1 TABLET DAILY     Cardiovascular:  ACEI + Diuretic Combos Passed - 04/06/2023  1:19 AM      Passed - Na in normal range and within 180 days     Sodium  Date Value Ref Range Status  03/24/2023 139 134 - 144 mmol/L Final  12/26/2011 140 136 - 145 mmol/L Final         Passed - K in normal range and within 180 days    Potassium  Date Value Ref Range Status  03/24/2023 4.5 3.5 - 5.2 mmol/L Final  12/26/2011 3.6 3.5 - 5.1 mmol/L Final         Passed - Cr in normal range and within 180 days    Creatinine  Date Value Ref Range Status  12/26/2011 0.76 0.60 - 1.30 mg/dL Final   Creatinine, Ser  Date Value Ref Range Status  03/24/2023 0.81 0.57 - 1.00 mg/dL Final         Passed - eGFR is 30 or above and within 180 days    EGFR (African American)  Date Value Ref Range Status  12/26/2011 >60  Final   GFR calc Af Amer  Date Value Ref Range Status  04/10/2019 85 >59 mL/min/1.73 Final   EGFR (Non-African Amer.)  Date Value Ref Range Status  12/26/2011 >60  Final    Comment:  eGFR values <20mL/min/1.73 m2 may be an indication of chronic kidney disease (CKD). Calculated eGFR is useful in patients with stable renal function. The eGFR calculation will not be reliable in acutely ill patients when serum creatinine is changing rapidly. It is not useful in  patients on dialysis. The eGFR calculation may not be applicable to patients at the low and high extremes of body sizes, pregnant women, and vegetarians.    GFR, Estimated  Date Value Ref Range Status  02/02/2022 >60 >60 mL/min Final    Comment:    (NOTE) Calculated using the CKD-EPI Creatinine Equation (2021)    eGFR  Date Value Ref Range Status  03/24/2023 76 >59 mL/min/1.73 Final         Passed - Patient is not pregnant      Passed - Last BP in normal range    BP Readings from Last 1 Encounters:  02/24/23 (!) 137/57         Passed - Valid encounter within last 6 months    Recent Outpatient Visits           1 month ago Impacted cerumen, bilateral   [place] [facility], Monico Blitz, DO   1 month ago Encounter for subsequent  annual wellness visit (AWV) in Medicare patient   [facility] [place], Monico Blitz, DO   4 months ago Dysuria   [place] [facility] Alfredia Ferguson, PA-C   4 months ago Acute cystitis with hematuria   [place] [facility] Ronnald Ramp, MD   1 year ago Hypertension associated with diabetes [facility])   [facility] Alfredia Ferguson, [place]

## 2023-04-12 ENCOUNTER — Ambulatory Visit (INDEPENDENT_AMBULATORY_CARE_PROVIDER_SITE_OTHER): Payer: Medicare Other | Admitting: Vascular Surgery

## 2023-04-12 ENCOUNTER — Encounter (INDEPENDENT_AMBULATORY_CARE_PROVIDER_SITE_OTHER): Payer: Self-pay | Admitting: Vascular Surgery

## 2023-04-12 ENCOUNTER — Ambulatory Visit (INDEPENDENT_AMBULATORY_CARE_PROVIDER_SITE_OTHER): Payer: Medicare Other

## 2023-04-12 VITALS — BP 142/73 | HR 53 | Resp 16 | Wt 148.0 lb

## 2023-04-12 DIAGNOSIS — E782 Mixed hyperlipidemia: Secondary | ICD-10-CM | POA: Diagnosis not present

## 2023-04-12 DIAGNOSIS — I6523 Occlusion and stenosis of bilateral carotid arteries: Secondary | ICD-10-CM | POA: Diagnosis not present

## 2023-04-12 DIAGNOSIS — E785 Hyperlipidemia, unspecified: Secondary | ICD-10-CM | POA: Insufficient documentation

## 2023-04-12 DIAGNOSIS — I10 Essential (primary) hypertension: Secondary | ICD-10-CM | POA: Diagnosis not present

## 2023-04-12 DIAGNOSIS — E119 Type 2 diabetes mellitus without complications: Secondary | ICD-10-CM

## 2023-04-12 NOTE — Progress Notes (Signed)
MRN : [id number]  Nicole Tucker is a 77 y.o. ([birth year]) female who presents with chief complaint of check carotid arteries.  History of Present Illness:   The patient is seen for follow up evaluation of carotid stenosis. The carotid stenosis followed by ultrasound.    The patient denies amaurosis fugax. There is no recent history of TIA symptoms or focal motor deficits. There is no prior documented CVA.   The patient is not taking enteric-coated aspirin 81 mg daily.   There is no history of migraine headaches. There is no history of seizures.   The patient has a history of coronary artery disease, no recent episodes of angina or shortness of breath. The patient denies PAD or claudication symptoms. There is a history of hyperlipidemia which is not currently being treated with a statin secondary to intolerance.     Carotid Duplex done today shows RICA 60-79% and LICA 1-39% (high grade left external stenosis).  The is a sigificant change compared to last study dated 04/13/2022.  Current Meds  Medication Sig   Ascorbic Acid (VITAMIN C PO) Take by mouth daily.   B Complex Vitamins (VITAMIN B COMPLEX PO) Take by mouth daily.   BIOTIN PO Take by mouth.   Cholecalciferol (VITAMIN D) 2000 units CAPS Take by mouth daily.    COLLAGEN PO Take by mouth.   Garlic 1500 MG CAPS Take 3,000 mg by mouth.   Iron Combinations (IRON COMPLEX PO) Take by mouth.   lisinopril (ZESTRIL) 10 MG tablet Take 1 tablet (10 mg total) by mouth daily. Please schedule office visit before any future refill.   lisinopril-hydrochlorothiazide (ZESTORETIC) 20-25 MG tablet TAKE 1 TABLET DAILY   Multiple Vitamin (MULTIVITAMIN WITH MINERALS) TABS tablet Take 1 tablet by mouth daily.   Omega-3 Fatty Acids (FISH OIL ODOR-LESS PO) Take by mouth.   VITAMIN A PO Take by mouth.   ZINC PICOLINATE PO Take by mouth.    Past Medical History:  Diagnosis Date   Arthritis    Diabetes mellitus without  complication (HCC)    New DX - no meds yet   Diverticular disease    Hypertension     Past Surgical History:  Procedure Laterality Date   CATARACT EXTRACTION     COLONOSCOPY N/A 10/07/2015   Procedure: COLONOSCOPY;  Surgeon: Midge Minium, MD;  Location: [facility];  Service: Endoscopy;  Laterality: N/A;   COLONOSCOPY WITH PROPOFOL N/A 08/25/2021   Procedure: COLONOSCOPY WITH PROPOFOL;  Surgeon: Midge Minium, MD;  Location: [facility];  Service: Endoscopy;  Laterality: N/A;   DILATION AND CURETTAGE OF UTERUS     LAPAROSCOPIC SIGMOID COLECTOMY N/A 11/15/2015   Procedure: LAPAROSCOPIC SIGMOID COLECTOMY possible open, possible colostomy;  Surgeon: Leafy Ro, MD;  Location: [facility] OR[facility];  Service: General;  Laterality: N/A;  Please have Hand port ( gelport green by [facility]) ready   TUBAL LIGATION      Social History Social History   Tobacco Use   Smoking status: Former    Current packs/day: 0.00    Types: Cigarettes    Quit date: 11/29/1990    Years since quitting: 32.3   Smokeless tobacco: Never   Tobacco comments:    quit 1992  Vaping Use   Vaping status: Never Used  Substance Use Topics   Alcohol use: Yes    Alcohol/week: 1.0 -  2.0 standard drink of alcohol    Types: 1 - 2 Glasses of wine per week   Drug use: No    Family History Family History  Problem Relation Age of Onset   Heart disease Mother    Hypertension Mother    Aneurysm Mother    Alcohol abuse Father    Bladder Cancer Sister    Hypertension Sister    Breast cancer Sister 65   Hypertension Sister    Diverticulitis Brother    Hypertension Brother    Hypertension Sister    Diabetes Paternal Grandmother    Heart disease Paternal Grandmother     Allergies  Allergen Reactions   Codeine Itching and Anxiety    Other reaction(s): Other (See Comments) Nervous     REVIEW OF SYSTEMS (Negative unless checked)  Constitutional: [] Weight loss  [] Fever  [] Chills Cardiac: [] Chest pain    [] Chest pressure   [] Palpitations   [] Shortness of breath when laying flat   [] Shortness of breath with exertion. Vascular:  [x] Pain in legs with walking   [] Pain in legs at rest  [] History of DVT   [] Phlebitis   [] Swelling in legs   [] Varicose veins   [] Non-healing ulcers Pulmonary:   [] Uses home oxygen   [] Productive cough   [] Hemoptysis   [] Wheeze  [] COPD   [] Asthma Neurologic:  [] Dizziness   [] Seizures   [] History of stroke   [] History of TIA  [] Aphasia   [] Vissual changes   [] Weakness or numbness in arm   [] Weakness or numbness in leg Musculoskeletal:   [] Joint swelling   [] Joint pain   [] Low back pain Hematologic:  [] Easy bruising  [] Easy bleeding   [] Hypercoagulable state   [] Anemic Gastrointestinal:  [] Diarrhea   [] Vomiting  [] Gastroesophageal reflux/heartburn   [] Difficulty swallowing. Genitourinary:  [] Chronic kidney disease   [] Difficult urination  [] Frequent urination   [] Blood in urine Skin:  [] Rashes   [] Ulcers  Psychological:  [] History of anxiety   []  History of major depression.  Physical Examination  Vitals:   04/12/23 1050  BP: (!) 142/73  Pulse: (!) 53  Resp: 16  Weight: 148 lb (67.1 kg)   Body mass index is 26.22 kg/m. Gen: WD/WN, NAD Head: [place]/AT, No temporalis wasting.  Ear/Nose/Throat: Hearing grossly intact, nares w/o erythema or drainage Eyes: PER, EOMI, sclera nonicteric.  Neck: Supple, no masses.  No bruit or JVD.  Pulmonary:  Good air movement, no audible wheezing, no use of accessory muscles.  Cardiac: RRR, normal S1, S2, no Murmurs. Vascular:  carotid bruit noted Vessel Right Left  Radial Palpable Palpable  Carotid  Palpable  Palpable  Gastrointestinal: soft, non-distended. No guarding/no peritoneal signs.  Musculoskeletal: M/S 5/5 throughout.  No visible deformity.  Neurologic: CN 2-12 intact. Pain and light touch intact in extremities.  Symmetrical.  Speech is fluent. Motor exam as listed above. Psychiatric: Judgment intact, Mood & affect  appropriate for pt's clinical situation. Dermatologic: No rashes or ulcers noted.  No changes consistent with cellulitis.   CBC Lab Results  Component Value Date   WBC 14.6 (H) 02/02/2022   HGB 15.3 (H) 02/02/2022   HCT 43.6 02/02/2022   MCV 96.9 02/02/2022   PLT 220 02/02/2022    BMET    Component Value Date/Time   NA 139 03/24/2023 0950   NA 140 12/26/2011 1920   K 4.5 03/24/2023 0950   K 3.6 12/26/2011 1920   CL 97 03/24/2023 0950   CL 103 12/26/2011 1920   CO2 24 03/24/2023 0950  CO2 26 12/26/2011 1920   GLUCOSE 91 03/24/2023 0950   GLUCOSE 96 02/02/2022 0801   GLUCOSE 90 12/26/2011 1920   BUN 9 03/24/2023 0950   BUN 19 (H) 12/26/2011 1920   CREATININE 0.81 03/24/2023 0950   CREATININE 0.76 12/26/2011 1920   CALCIUM 9.7 03/24/2023 0950   CALCIUM 9.2 12/26/2011 1920   GFRNONAA >60 02/02/2022 0801   GFRNONAA >60 12/26/2011 1920   GFRAA 85 04/10/2019 1212   GFRAA >60 12/26/2011 1920   Estimated Creatinine Clearance: 55.2 mL/min (by C-G formula based on SCr of 0.81 mg/dL).  COAG Lab Results  Component Value Date   INR 1.11 08/31/2015   INR 1.00 08/30/2015    Radiology MM 3D SCREENING MAMMOGRAM BILATERAL BREAST  Result Date: 03/18/2023 CLINICAL DATA:  Screening. EXAM: DIGITAL SCREENING BILATERAL MAMMOGRAM WITH TOMOSYNTHESIS AND CAD TECHNIQUE: Bilateral screening digital craniocaudal and mediolateral oblique mammograms were obtained. Bilateral screening digital breast tomosynthesis was performed. The images were evaluated with computer-aided detection. COMPARISON:  Previous exam(s). ACR Breast Density Category a: The breasts are almost entirely fatty. FINDINGS: There are no findings suspicious for malignancy. IMPRESSION: No mammographic evidence of malignancy. A result letter of this screening mammogram will be mailed directly to the patient. RECOMMENDATION: Screening mammogram in one year. (Code:SM-B-01Y) BI-RADS CATEGORY  1: Negative. Electronically Signed   By:  Edwin Cap M.D.   On: 03/18/2023 15:44     Assessment/Plan 1. Bilateral carotid artery stenosis Recommend:  Given the patient's asymptomatic subcritical stenosis no further invasive testing or surgery at this time.  Duplex ultrasound shows a change on the right side and she is now reported as 60 to 79% right internal carotid artery stenosis and 1 to 39% left internal carotid artery stenosis.  Recently she has not been taking aspirin.  Of asked that she begin taking an enteric-coated 81 mg aspirin on a daily basis.  We also discussed her statin therapy.  In the past she has been on Crestor 5 mg p.o. daily or every other day but was still having some extremity pain.  For that reason she has stopped the Crestor.  I have asked that she restarted at 5 mg daily.  If her myalgias recur then perhaps a trial of Lipitor would be beneficial.  Either way, we did describe send she seems to have progressed on the right side that trying to modulate her plaque formation would be of significant benefit. Continue management of CAD, HTN and Hyperlipidemia Healthy heart diet,  encouraged exercise at least 4 times per week  I will change her follow-up.  She will now follow up in 6 months with duplex ultrasound and physical exam  - VAS US CAROTID; Future  2. Primary hypertension Continue antihypertensive medications as already ordered, these medications have been reviewed and there are no changes at this time.  3. Mixed hyperlipidemia We also discussed her statin therapy.  In the past she has been on Crestor 5 mg p.o. daily or every other day but was still having some extremity pain.  For that reason she has stopped the Crestor.  I have asked that she restarted at 5 mg daily.  If her myalgias recur then perhaps a trial of Lipitor would be beneficial.  Either way, we did describe send she seems to have progressed on the right side that trying to modulate her plaque formation would be of significant  benefit.  4. Diabetes mellitus without complication (HCC) Continue hypoglycemic medications as already ordered, these medications have been reviewed  and there are no changes at this time.  Hgb A1C to be monitored as already arranged by primary service    Levora Dredge, MD  04/12/2023 11:16 AM

## 2023-04-15 ENCOUNTER — Telehealth: Payer: Self-pay | Admitting: Family Medicine

## 2023-04-15 NOTE — Telephone Encounter (Signed)
Pt is calling in because she is being charged for her visit 02/17/23. Pt says she reached out to billing and was told the office needed to recode the visit and then resubmit it to medicare. Pt says she didn't have 2 AWV, and the visit on 02/17/23 was for her ear. Pt is requesting a call back if there are any questions.

## 2023-04-27 NOTE — Telephone Encounter (Signed)
Pt is calling back upset, stating she has not heard back in regards to inquiries about billing issues. Stated she is being charged for two AWV. Pt reached out to billing and was told the office needed to recode the visit and then resubmit it to Medicare. Pt says she didn't have 2 AWVs.  Stated both visits date of service 12/02/2022 and 02/17/2023; need to be resubmitted to Medicare.   With her insurance information.  Pt is requesting a callback.  Please advise.

## 2023-05-05 NOTE — Telephone Encounter (Signed)
Pt calling back as she has not heard from anyone in regards previous messages. Pt requesting the charges be resubmitted and requesting a call back. Pt very upset that she is having to call for the third time.   Pt requesting call back from Research officer, political party.

## 2023-05-14 ENCOUNTER — Telehealth: Payer: Self-pay | Admitting: Family Medicine

## 2023-05-14 DIAGNOSIS — I10 Essential (primary) hypertension: Secondary | ICD-10-CM

## 2023-05-14 NOTE — Telephone Encounter (Signed)
Pt requesting lisinopril (ZESTRIL) 10 MG tablet [facility] into [facility] for a 90 day supply   LOV 02/17/23

## 2023-05-17 MED ORDER — LISINOPRIL 10 MG PO TABS: 10.0000 mg | ORAL_TABLET | Freq: Every day | ORAL | 1 refills | Status: DC

## 2023-07-14 NOTE — Telephone Encounter (Signed)
Spoke with the patient on 12/20.  Billing has removed the DOS 02/17/23 due to duplicate services.

## 2023-10-04 ENCOUNTER — Other Ambulatory Visit: Payer: Self-pay | Admitting: Family Medicine

## 2023-10-04 DIAGNOSIS — I10 Essential (primary) hypertension: Secondary | ICD-10-CM

## 2023-10-05 NOTE — Telephone Encounter (Signed)
 Requested medications are due for refill today.  unsure  Requested medications are on the active medications list.  yes  Last refill. 04/07/2023 #90 1 rf  Future visit scheduled.   no  Notes to clinic.  Pt has lisinopril  alone and as part of a combination drug ordered. Please advise.    Requested Prescriptions  Pending Prescriptions Disp Refills   lisinopril -hydrochlorothiazide  (ZESTORETIC ) 20-25 MG tablet [Pharmacy Med Name: LISINOPRIL /HYDROCHLOROTHIAZIDE  TABS 20/25MG ] 90 tablet 3    Sig: TAKE 1 TABLET DAILY     Cardiovascular:  ACEI + Diuretic Combos Failed - 10/05/2023  3:02 PM      Failed - Na in normal range and within 180 days    Sodium  Date Value Ref Range Status  03/24/2023 139 134 - 144 mmol/L Final  12/26/2011 140 136 - 145 mmol/L Final         Failed - K in normal range and within 180 days    Potassium  Date Value Ref Range Status  03/24/2023 4.5 3.5 - 5.2 mmol/L Final  12/26/2011 3.6 3.5 - 5.1 mmol/L Final         Failed - Cr in normal range and within 180 days    Creatinine  Date Value Ref Range Status  12/26/2011 0.76 0.60 - 1.30 mg/dL Final   Creatinine, Ser  Date Value Ref Range Status  03/24/2023 0.81 0.57 - 1.00 mg/dL Final         Failed - eGFR is 30 or above and within 180 days    EGFR (African American)  Date Value Ref Range Status  12/26/2011 >60  Final   GFR calc Af Amer  Date Value Ref Range Status  04/10/2019 85 >59 mL/min/1.73 Final   EGFR (Non-African Amer.)  Date Value Ref Range Status  12/26/2011 >60  Final    Comment:    eGFR values <37mL/min/1.73 m2 may be an indication of chronic kidney disease (CKD). Calculated eGFR is useful in patients with stable renal function. The eGFR calculation will not be reliable in acutely ill patients when serum creatinine is changing rapidly. It is not useful in  patients on dialysis. The eGFR calculation may not be applicable to patients at the low and high extremes of body sizes,  pregnant women, and vegetarians.    GFR, Estimated  Date Value Ref Range Status  02/02/2022 >60 >60 mL/min Final    Comment:    (NOTE) Calculated using the CKD-EPI Creatinine Equation (2021)    eGFR  Date Value Ref Range Status  03/24/2023 76 >59 mL/min/1.73 Final         Failed - Last BP in normal range    BP Readings from Last 1 Encounters:  04/12/23 (!) 142/73         Failed - Valid encounter within last 6 months    Recent Outpatient Visits   None            Passed - Patient is not pregnant

## 2023-10-07 NOTE — Progress Notes (Signed)
 MRN : [id number]  Nicole Tucker is a 78 y.o. (1945-06-09) female who presents with chief complaint of check circulation.  History of Present Illness:     The patient is seen for follow up evaluation of carotid stenosis. The carotid stenosis followed by ultrasound.    The patient denies amaurosis fugax. There is no recent history of TIA symptoms or focal motor deficits. There is no prior documented CVA.   The patient is not taking enteric-coated aspirin 81 mg daily.   There is no history of migraine headaches. There is no history of seizures.   The patient has a history of coronary artery disease, no recent episodes of angina or shortness of breath. The patient denies PAD or claudication symptoms. There is a history of hyperlipidemia which is not currently being treated with a statin secondary to intolerance.     Carotid Duplex done today shows RICA 60-79% and LICA 1-39% (high grade left external stenosis).  No significant change compared to study dated April 12, 2023.   No outpatient medications have been marked as taking for the 10/11/23 encounter (Appointment) with Prescilla Brod, Ninette Basque, MD.    Past Medical History:  Diagnosis Date   Arthritis    Diabetes mellitus without complication (HCC)    New DX - no meds yet   Diverticular disease    Hypertension     Past Surgical History:  Procedure Laterality Date   CATARACT EXTRACTION     COLONOSCOPY N/A 10/07/2015   Procedure: COLONOSCOPY;  Surgeon: Marnee Sink, MD;  Location: [facility];  Service: Endoscopy;  Laterality: N/A;   COLONOSCOPY WITH PROPOFOL  N/A 08/25/2021   Procedure: COLONOSCOPY WITH PROPOFOL ;  Surgeon: Marnee Sink, MD;  Location: [facility];  Service: Endoscopy;  Laterality: N/A;   DILATION AND CURETTAGE OF UTERUS     LAPAROSCOPIC SIGMOID COLECTOMY N/A 11/15/2015   Procedure: LAPAROSCOPIC SIGMOID COLECTOMY possible open,  possible colostomy;  Surgeon: Alben Alma, MD;  Location: [facility];  Service: General;  Laterality: N/A;  Please have Hand port ( gelport green by [facility]) ready   TUBAL LIGATION      Social History Social History   Tobacco Use   Smoking status: Former    Current packs/day: 0.00    Types: Cigarettes    Quit date: 11/29/1990    Years since quitting: 32.8   Smokeless tobacco: Never   Tobacco comments:    quit 1992  Vaping Use   Vaping status: Never Used  Substance Use Topics   Alcohol use: Yes    Alcohol/week: 1.0 - 2.0 standard drink of alcohol    Types: 1 - 2 Glasses of wine per week   Drug use: No    Family History Family History  Problem Relation Age of Onset   Heart disease Mother    Hypertension Mother    Aneurysm Mother    Alcohol abuse Father    Bladder Cancer Sister    Hypertension Sister    Breast cancer Sister 93   Hypertension Sister    Diverticulitis Brother    Hypertension Brother  Hypertension Sister    Diabetes Paternal Grandmother    Heart disease Paternal Grandmother     Allergies  Allergen Reactions   Codeine Itching and Anxiety    Other reaction(s): Other (See Comments) Nervous     REVIEW OF SYSTEMS (Negative unless checked)  Constitutional: [] Weight loss  [] Fever  [] Chills Cardiac: [] Chest pain   [] Chest pressure   [] Palpitations   [] Shortness of breath when laying flat   [] Shortness of breath with exertion. Vascular:  [x] Pain in legs with walking   [] Pain in legs at rest  [] History of DVT   [] Phlebitis   [] Swelling in legs   [] Varicose veins   [] Non-healing ulcers Pulmonary:   [] Uses home oxygen   [] Productive cough   [] Hemoptysis   [] Wheeze  [] COPD   [] Asthma Neurologic:  [] Dizziness   [] Seizures   [] History of stroke   [] History of TIA  [] Aphasia   [] Vissual changes   [] Weakness or numbness in arm   [] Weakness or numbness in leg Musculoskeletal:   [] Joint swelling   [x] Joint pain   [x] Low back pain Hematologic:  [] Easy  bruising  [] Easy bleeding   [] Hypercoagulable state   [] Anemic Gastrointestinal:  [] Diarrhea   [] Vomiting  [] Gastroesophageal reflux/heartburn   [] Difficulty swallowing. Genitourinary:  [x] Chronic kidney disease   [] Difficult urination  [] Frequent urination   [] Blood in urine Skin:  [] Rashes   [] Ulcers  Psychological:  [x] History of anxiety   []  History of major depression.  Physical Examination  There were no vitals filed for this visit. There is no height or weight on file to calculate BMI. Gen: WD/WN, NAD Head: [place]/AT, No temporalis wasting.  Ear/Nose/Throat: Hearing grossly intact, nares w/o erythema or drainage Eyes: PER, EOMI, sclera nonicteric.  Neck: Supple, no masses.  No bruit or JVD.  Pulmonary:  Good air movement, no audible wheezing, no use of accessory muscles.  Cardiac: RRR, normal S1, S2, no Murmurs. Vascular: Bilateral carotid bruits noted Vessel Right Left  Radial Palpable Palpable  Carotid Palpable  Palpable  Gastrointestinal: soft, non-distended. No guarding/no peritoneal signs.  Musculoskeletal: M/S 5/5 throughout.  No visible deformity.  Neurologic: CN 2-12 intact. Pain and light touch intact in extremities.  Symmetrical.  Speech is fluent. Motor exam as listed above. Psychiatric: Judgment intact, Mood & affect appropriate for pt's clinical situation. Dermatologic: No rashes or ulcers noted.  No changes consistent with cellulitis.   CBC Lab Results  Component Value Date   WBC 14.6 (H) 02/02/2022   HGB 15.3 (H) 02/02/2022   HCT 43.6 02/02/2022   MCV 96.9 02/02/2022   PLT 220 02/02/2022    BMET    Component Value Date/Time   NA 139 03/24/2023 0950   NA 140 12/26/2011 1920   K 4.5 03/24/2023 0950   K 3.6 12/26/2011 1920   CL 97 03/24/2023 0950   CL 103 12/26/2011 1920   CO2 24 03/24/2023 0950   CO2 26 12/26/2011 1920   GLUCOSE 91 03/24/2023 0950   GLUCOSE 96 02/02/2022 0801   GLUCOSE 90 12/26/2011 1920   BUN 9 03/24/2023 0950   BUN 19 (H)  12/26/2011 1920   CREATININE 0.81 03/24/2023 0950   CREATININE 0.76 12/26/2011 1920   CALCIUM  9.7 03/24/2023 0950   CALCIUM  9.2 12/26/2011 1920   GFRNONAA >60 02/02/2022 0801   GFRNONAA >60 12/26/2011 1920   GFRAA 85 04/10/2019 1212   GFRAA >60 12/26/2011 1920   CrCl cannot be calculated (Patient's most recent lab result is older than the maximum 21 days  allowed.).  COAG Lab Results  Component Value Date   INR 1.11 08/31/2015   INR 1.00 08/30/2015    Radiology No results found.   Assessment/Plan 1. Bilateral carotid artery stenosis (Primary) Recommend:   Given the patient's asymptomatic subcritical stenosis no further invasive testing or surgery at this time.   Duplex ultrasound shows a change on the right side and she is now reported as 60 to 79% right internal carotid artery stenosis and 1 to 39% left internal carotid artery stenosis.   Recently she has not been taking aspirin.  Of asked that she begin taking an enteric-coated 81 mg aspirin on a daily basis.   We also discussed her statin therapy.  In the past she has been on Crestor  5 mg p.o. daily or every other day but was still having some extremity pain.  For that reason she has stopped the Crestor .  I have asked that she restarted at 5 mg daily.  If her myalgias recur then perhaps a trial of Lipitor would be beneficial.  Either way, we did describe send she seems to have progressed on the right side that trying to modulate her plaque formation would be of significant benefit. Continue management of CAD, HTN and Hyperlipidemia Healthy heart diet,  encouraged exercise at least 4 times per week   I will change her follow-up.  She will now follow up in 6 months with duplex ultrasound and physical exam   2. Primary hypertension Continue antihypertensive medications as already ordered, these medications have been reviewed and there are no changes at this time.  3. Diabetes mellitus without complication (HCC) Continue  hypoglycemic medications as already ordered, these medications have been reviewed and there are no changes at this time.  Hgb A1C to be monitored as already arranged by primary service  4. Mixed hyperlipidemia Continue statin as ordered and reviewed, no changes at this time    Devon Fogo, MD  10/07/2023 12:44 PM

## 2023-10-07 NOTE — Telephone Encounter (Unsigned)
 Copied from CRM [id number]. Topic: Clinical - Medication Refill >> Oct 07, 2023 12:09 PM Fredrica W wrote: Medication: 90 days  lisinopril -hydrochlorothiazide  (ZESTORETIC ) 20-25 MG tablet lisinopril  (ZESTRIL ) 10 MG tablet   Has the patient contacted their pharmacy? Yes (Agent: If no, request that the patient contact the pharmacy for the refill. If patient does not wish to contact the pharmacy document the reason why and proceed with request.) (Agent: If yes, when and what did the pharmacy advise?) said she needs appt - they sent request to previous provider   This is the patient's preferred pharmacy:  [facility] - [place], [place] - [address] [place] [address] Phone: [phone] Fax: [phone]  Is this the correct pharmacy for this prescription? Yes If no, delete pharmacy and type the correct one.   Has the prescription been filled recently? No  Is the patient out of the medication? No  Has the patient been seen for an appointment in the last year OR does the patient have an upcoming appointment? Yes  Can we respond through MyChart? Yes  Agent: Please be advised that Rx refills may take up to 3 business days. We ask that you follow-up with your pharmacy.

## 2023-10-08 ENCOUNTER — Other Ambulatory Visit (INDEPENDENT_AMBULATORY_CARE_PROVIDER_SITE_OTHER): Payer: Self-pay | Admitting: Vascular Surgery

## 2023-10-08 DIAGNOSIS — I6523 Occlusion and stenosis of bilateral carotid arteries: Secondary | ICD-10-CM

## 2023-10-11 ENCOUNTER — Ambulatory Visit (INDEPENDENT_AMBULATORY_CARE_PROVIDER_SITE_OTHER): Payer: 59 | Admitting: Vascular Surgery

## 2023-10-11 ENCOUNTER — Ambulatory Visit (INDEPENDENT_AMBULATORY_CARE_PROVIDER_SITE_OTHER): Payer: Medicare Other

## 2023-10-11 ENCOUNTER — Encounter (INDEPENDENT_AMBULATORY_CARE_PROVIDER_SITE_OTHER): Payer: Self-pay | Admitting: Vascular Surgery

## 2023-10-11 VITALS — BP 186/69 | HR 71 | Resp 16 | Wt 155.8 lb

## 2023-10-11 DIAGNOSIS — E119 Type 2 diabetes mellitus without complications: Secondary | ICD-10-CM

## 2023-10-11 DIAGNOSIS — E782 Mixed hyperlipidemia: Secondary | ICD-10-CM

## 2023-10-11 DIAGNOSIS — I6523 Occlusion and stenosis of bilateral carotid arteries: Secondary | ICD-10-CM

## 2023-10-11 DIAGNOSIS — I10 Essential (primary) hypertension: Secondary | ICD-10-CM | POA: Diagnosis not present

## 2023-10-12 ENCOUNTER — Encounter (INDEPENDENT_AMBULATORY_CARE_PROVIDER_SITE_OTHER): Payer: Self-pay

## 2023-10-13 ENCOUNTER — Other Ambulatory Visit: Payer: Self-pay

## 2023-10-13 ENCOUNTER — Encounter: Payer: Self-pay | Admitting: Family Medicine

## 2023-10-13 DIAGNOSIS — I10 Essential (primary) hypertension: Secondary | ICD-10-CM

## 2023-10-13 MED ORDER — LISINOPRIL 10 MG PO TABS: 10.0000 mg | ORAL_TABLET | Freq: Every day | ORAL | 1 refills | Status: DC

## 2023-10-15 MED ORDER — LISINOPRIL-HYDROCHLOROTHIAZIDE 20-25 MG PO TABS: 1.0000 | ORAL_TABLET | Freq: Every day | ORAL | 1 refills | Status: DC

## 2023-10-25 ENCOUNTER — Other Ambulatory Visit: Payer: Self-pay

## 2023-10-25 ENCOUNTER — Telehealth: Payer: Self-pay | Admitting: Family Medicine

## 2023-10-25 DIAGNOSIS — I10 Essential (primary) hypertension: Secondary | ICD-10-CM

## 2023-10-25 DIAGNOSIS — E1169 Type 2 diabetes mellitus with other specified complication: Secondary | ICD-10-CM

## 2023-10-25 NOTE — Telephone Encounter (Signed)
 [facility] pharmacy faxed refill request for the following medications:   rosuvastatin  (CRESTOR ) 5 MG tablet    Please advise

## 2023-10-28 MED ORDER — ROSUVASTATIN CALCIUM 5 MG PO TABS: 5.0000 mg | ORAL_TABLET | Freq: Every day | ORAL | 3 refills | Status: AC

## 2023-10-28 NOTE — Addendum Note (Signed)
 Addended by: Judyann Number on: 10/28/2023 01:32 PM   Modules accepted: Orders

## 2023-11-29 ENCOUNTER — Ambulatory Visit: Admitting: Family Medicine

## 2023-12-30 DIAGNOSIS — M7662 Achilles tendinitis, left leg: Secondary | ICD-10-CM | POA: Diagnosis not present

## 2023-12-30 DIAGNOSIS — M7732 Calcaneal spur, left foot: Secondary | ICD-10-CM | POA: Diagnosis not present

## 2023-12-30 DIAGNOSIS — M79672 Pain in left foot: Secondary | ICD-10-CM | POA: Diagnosis not present

## 2023-12-30 DIAGNOSIS — M10072 Idiopathic gout, left ankle and foot: Secondary | ICD-10-CM | POA: Diagnosis not present

## 2024-03-22 ENCOUNTER — Ambulatory Visit

## 2024-03-31 ENCOUNTER — Other Ambulatory Visit: Payer: Self-pay | Admitting: Family Medicine

## 2024-03-31 DIAGNOSIS — I10 Essential (primary) hypertension: Secondary | ICD-10-CM

## 2024-04-06 ENCOUNTER — Other Ambulatory Visit (INDEPENDENT_AMBULATORY_CARE_PROVIDER_SITE_OTHER): Payer: Self-pay | Admitting: Vascular Surgery

## 2024-04-06 ENCOUNTER — Telehealth: Payer: Self-pay | Admitting: Family Medicine

## 2024-04-06 DIAGNOSIS — I6523 Occlusion and stenosis of bilateral carotid arteries: Secondary | ICD-10-CM

## 2024-04-06 NOTE — Telephone Encounter (Signed)
 Spoke with Nat, care guide, regarding this.   She and Jhonnie are working on getting this patient seen this year.  No openings at the present time but one of those 2 are to contact patient when someone cancels, which is daily per [place].

## 2024-04-06 NOTE — Telephone Encounter (Signed)
 Copied from CRM [id number]. Topic: Appointments - Scheduling Inquiry for Clinic >> Apr 06, 2024  8:56 AM Tiffini S wrote: Reason for CRM: Patient called to schedule an AWV appointment before the end of the year- please call the patient back- was told that no appointments are available and she would be squeezed into the schedule.  Please follow up with the patient at [phone] (M)

## 2024-04-10 ENCOUNTER — Ambulatory Visit (INDEPENDENT_AMBULATORY_CARE_PROVIDER_SITE_OTHER): Admitting: Vascular Surgery

## 2024-04-10 ENCOUNTER — Ambulatory Visit (INDEPENDENT_AMBULATORY_CARE_PROVIDER_SITE_OTHER)

## 2024-04-10 ENCOUNTER — Encounter (INDEPENDENT_AMBULATORY_CARE_PROVIDER_SITE_OTHER): Payer: Self-pay | Admitting: Vascular Surgery

## 2024-04-10 VITALS — BP 160/76 | HR 55 | Resp 16 | Ht 63.0 in | Wt 163.4 lb

## 2024-04-10 DIAGNOSIS — I6523 Occlusion and stenosis of bilateral carotid arteries: Secondary | ICD-10-CM

## 2024-04-10 DIAGNOSIS — E119 Type 2 diabetes mellitus without complications: Secondary | ICD-10-CM | POA: Diagnosis not present

## 2024-04-10 DIAGNOSIS — E782 Mixed hyperlipidemia: Secondary | ICD-10-CM | POA: Diagnosis not present

## 2024-04-10 DIAGNOSIS — I10 Essential (primary) hypertension: Secondary | ICD-10-CM | POA: Diagnosis not present

## 2024-04-10 NOTE — Progress Notes (Signed)
 MRN : [id number]  Nicole Tucker is a 78 y.o. ([birth year]) female who presents with chief complaint of check carotid arteries.  History of Present Illness:    The patient is seen for follow up evaluation of carotid stenosis. The carotid stenosis followed by ultrasound.    The patient denies amaurosis fugax. There is no recent history of TIA symptoms or focal motor deficits. There is no prior documented CVA.   The patient is not taking enteric-coated aspirin 81 mg daily.   The patient has a history of coronary artery disease, no recent episodes of angina or shortness of breath. The patient denies PAD or claudication symptoms. There is a history of hyperlipidemia which is not currently being treated with a statin secondary to intolerance.     Carotid Duplex done today shows RICA 80-99% and LICA 1-39% (high grade left external stenosis).  Increase in velocities on the right compared to study dated May, 2025.     No outpatient medications have been marked as taking for the 04/10/24 encounter (Appointment) with Jama, Cordella MATSU, MD.    Past Medical History:  Diagnosis Date   Arthritis    Diabetes mellitus without complication (HCC)    New DX - no meds yet   Diverticular disease    Hypertension     Past Surgical History:  Procedure Laterality Date   CATARACT EXTRACTION     COLONOSCOPY N/A 10/07/2015   Procedure: COLONOSCOPY;  Surgeon: Rogelia Copping, MD;  Location: [facility];  Service: Endoscopy;  Laterality: N/A;   COLONOSCOPY WITH PROPOFOL  N/A 08/25/2021   Procedure: COLONOSCOPY WITH PROPOFOL ;  Surgeon: Copping Rogelia, MD;  Location: [facility];  Service: Endoscopy;  Laterality: N/A;   DILATION AND CURETTAGE OF UTERUS     LAPAROSCOPIC SIGMOID COLECTOMY N/A 11/15/2015   Procedure: LAPAROSCOPIC SIGMOID COLECTOMY possible open, possible colostomy;  Surgeon: Laneta JULIANNA Luna, MD;  Location: [facility] ORS;  Service: General;  Laterality: N/A;  Please have  Hand port ( gelport green by [employer]) ready   TUBAL LIGATION      Social History Social History   Tobacco Use   Smoking status: Former    Current packs/day: 0.00    Types: Cigarettes    Quit date: 11/29/1990    Years since quitting: 33.3   Smokeless tobacco: Never   Tobacco comments:    quit 1992  Vaping Use   Vaping status: Never Used  Substance Use Topics   Alcohol use: Yes    Alcohol/week: 1.0 - 2.0 standard drink of alcohol    Types: 1 - 2 Glasses of wine per week   Drug use: No    Family History Family History  Problem Relation Age of Onset   Heart disease Mother    Hypertension Mother    Aneurysm Mother    Alcohol abuse Father    Bladder Cancer Sister    Hypertension Sister    Breast cancer Sister 64   Hypertension Sister    Diverticulitis Brother    Hypertension Brother    Hypertension Sister    Diabetes Paternal Grandmother    Heart disease Paternal Grandmother     Allergies  Allergen Reactions   Codeine Itching and Anxiety    Other reaction(s): Other (See Comments) Nervous     REVIEW OF SYSTEMS (Negative unless checked)  Constitutional: [] Weight loss  [] Fever  []   Chills Cardiac: [] Chest pain   [] Chest pressure   [] Palpitations   [] Shortness of breath when laying flat   [] Shortness of breath with exertion. Vascular:  [x] Pain in legs with walking   [] Pain in legs at rest  [] History of DVT   [] Phlebitis   [] Swelling in legs   [] Varicose veins   [] Non-healing ulcers Pulmonary:   [] Uses home oxygen   [] Productive cough   [] Hemoptysis   [] Wheeze  [] COPD   [] Asthma Neurologic:  [] Dizziness   [] Seizures   [] History of stroke   [] History of TIA  [] Aphasia   [] Vissual changes   [] Weakness or numbness in arm   [] Weakness or numbness in leg Musculoskeletal:   [] Joint swelling   [] Joint pain   [] Low back pain Hematologic:  [] Easy bruising  [] Easy bleeding   [] Hypercoagulable state   [] Anemic Gastrointestinal:  [] Diarrhea   [] Vomiting  [] Gastroesophageal  reflux/heartburn   [] Difficulty swallowing. Genitourinary:  [] Chronic kidney disease   [] Difficult urination  [] Frequent urination   [] Blood in urine Skin:  [] Rashes   [] Ulcers  Psychological:  [] History of anxiety   []  History of major depression.  Physical Examination  There were no vitals filed for this visit. There is no height or weight on file to calculate BMI. Gen: WD/WN, NAD Head: [place]/AT, No temporalis wasting.  Ear/Nose/Throat: Hearing grossly intact, nares w/o erythema or drainage Eyes: PER, EOMI, sclera nonicteric.  Neck: Supple, no masses.  No bruit or JVD.  Pulmonary:  Good air movement, no audible wheezing, no use of accessory muscles.  Cardiac: RRR, normal S1, S2, no Murmurs. Vascular:  carotid bruit noted Vessel Right Left  Radial Palpable Palpable  Carotid  Palpable  Palpable  Gastrointestinal: soft, non-distended. No guarding/no peritoneal signs.  Musculoskeletal: M/S 5/5 throughout.  No visible deformity.  Neurologic: CN 2-12 intact. Pain and light touch intact in extremities.  Symmetrical.  Speech is fluent. Motor exam as listed above. Psychiatric: Judgment intact, Mood & affect appropriate for pt's clinical situation. Dermatologic: No rashes or ulcers noted.  No changes consistent with cellulitis.   CBC Lab Results  Component Value Date   WBC 14.6 (H) 02/02/2022   HGB 15.3 (H) 02/02/2022   HCT 43.6 02/02/2022   MCV 96.9 02/02/2022   PLT 220 02/02/2022    BMET    Component Value Date/Time   NA 139 03/24/2023 0950   NA 140 12/26/2011 1920   K 4.5 03/24/2023 0950   K 3.6 12/26/2011 1920   CL 97 03/24/2023 0950   CL 103 12/26/2011 1920   CO2 24 03/24/2023 0950   CO2 26 12/26/2011 1920   GLUCOSE 91 03/24/2023 0950   GLUCOSE 96 02/02/2022 0801   GLUCOSE 90 12/26/2011 1920   BUN 9 03/24/2023 0950   BUN 19 (H) 12/26/2011 1920   CREATININE 0.81 03/24/2023 0950   CREATININE 0.76 12/26/2011 1920   CALCIUM  9.7 03/24/2023 0950   CALCIUM  9.2 12/26/2011  1920   GFRNONAA >60 02/02/2022 0801   GFRNONAA >60 12/26/2011 1920   GFRAA 85 04/10/2019 1212   GFRAA >60 12/26/2011 1920   CrCl cannot be calculated (Patient's most recent lab result is older than the maximum 21 days allowed.).  COAG Lab Results  Component Value Date   INR 1.11 08/31/2015   INR 1.00 08/30/2015    Radiology No results found.   Assessment/Plan 1. Bilateral carotid artery stenosis (Primary) Recommend:  The patient remains asymptomatic with respect to the carotid stenosis.  However, the patient has now progressed  and has a lesion the is >75%.  Patient should undergo CT angiography of the carotid arteries to define the degree of stenosis of the internal carotid arteries bilaterally and the anatomic suitability for surgery.  If the patient does indeed need surgery cardiac clearance will be required, once cleared the patient will be scheduled for surgery.  The risks, benefits and alternative therapies were reviewed in detail with the patient.  All questions were answered.  The patient agrees to proceed with imaging.  Continue antiplatelet therapy as prescribed. Continue management of CAD, HTN and Hyperlipidemia. Healthy heart diet, encouraged exercise at least 4 times per week.  - CT ANGIO NECK W OR WO CONTRAST; Future  2. Primary hypertension Continue antihypertensive medications as already ordered, these medications have been reviewed and there are no changes at this time.  3. Diabetes mellitus without complication (HCC) Continue hypoglycemic medications as already ordered, these medications have been reviewed and there are no changes at this time.  Hgb A1C to be monitored as already arranged by primary service  4. Mixed hyperlipidemia Continue statin as ordered and reviewed, no changes at this time    Cordella Shawl, MD  04/10/2024 10:43 AM

## 2024-04-12 ENCOUNTER — Ambulatory Visit (INDEPENDENT_AMBULATORY_CARE_PROVIDER_SITE_OTHER)

## 2024-04-12 VITALS — BP 122/74 | Ht 63.0 in | Wt 162.3 lb

## 2024-04-12 DIAGNOSIS — Z1231 Encounter for screening mammogram for malignant neoplasm of breast: Secondary | ICD-10-CM | POA: Diagnosis not present

## 2024-04-12 DIAGNOSIS — Z0000 Encounter for general adult medical examination without abnormal findings: Secondary | ICD-10-CM | POA: Diagnosis not present

## 2024-04-12 NOTE — Patient Instructions (Addendum)
 Nicole Tucker,  Thank you for taking the time for your Medicare Wellness Visit. I appreciate your continued commitment to your health goals. Please review the care plan we discussed, and feel free to reach out if I can assist you further.  Please note that Annual Wellness Visits do not include a physical exam. Some assessments may be limited, especially if the visit was conducted virtually. If needed, we may recommend an in-person follow-up with your provider.  Ongoing Care Seeing your primary care provider every 3 to 6 months helps us  monitor your health and provide consistent, personalized care.   Referrals If a referral was made during today's visit and you haven't received any updates within two weeks, please contact the referred provider directly to check on the status. REFERRAL SENT FOR MAMMOGRAM You have an order for:  []   2D Mammogram  [x]   3D Mammogram  []   Bone Density     Please call for appointment:  [facility]  [address] [place] [address] [phone] [facility] [address] [place], [place] [address] [phone] [facility] [address] [place], [place] [address] [phone]   Make sure to wear two-piece clothing.  No lotions, powders, or deodorants the day of the appointment. Make sure to bring picture ID and insurance card.  Bring list of medications you are currently taking including any supplements.   Schedule your [place] screening mammogram through MyChart!   Log into your MyChart account.  Go to 'Visit' (or 'Appointments' if on mobile App) --> Schedule an Appointment  Under 'Select a Reason for Visit' choose the Mammogram Screening option.  Complete the pre-visit questions and select the time and place that best fits your schedule.   Recommended Screenings:  Health Maintenance  Topic Date Due   Zoster  (Shingles) Vaccine (1 of 2) Never done   DTaP/Tdap/Td vaccine (2 - Td or Tdap) 10/15/2019   Hemoglobin A1C  08/17/2023   Flu Shot  Never done   COVID-19 Vaccine (1 - 2025-26 season) Never done   Yearly kidney health urinalysis for diabetes  02/17/2024   Yearly kidney function blood test for diabetes  03/23/2024   Breast Cancer Screening  03/16/2024   Medicare Annual Wellness Visit  04/12/2025   Osteoporosis screening with Bone Density Scan  09/09/2026   Pneumococcal Vaccine for age over 37  Completed   Hepatitis C Screening  Completed   Meningitis B Vaccine  Aged Out   Complete foot exam   Discontinued   Eye exam for diabetics  Discontinued   Colon Cancer Screening  Discontinued       04/12/2024    1:41 PM  Advanced Directives  Does Patient Have a Medical Advance Directive? No  Would patient like information on creating a medical advance directive? No - Patient declined    Vision: Annual vision screenings are recommended for early detection of glaucoma, cataracts, and diabetic retinopathy. These exams can also reveal signs of chronic conditions such as diabetes and high blood pressure.  Dental: Annual dental screenings help detect early signs of oral cancer, gum disease, and other conditions linked to overall health, including heart disease and diabetes.  Please see the attached documents for additional preventive care recommendations.   NEXT AWV 04/18/25 @ 1:10 PM IN PERSON

## 2024-04-12 NOTE — Progress Notes (Signed)
 No chief complaint on file.    Subjective:   Keyarra Raburn is a 78 y.o. female who presents for a Medicare Annual Wellness Visit.  Allergies (verified) Codeine   History: Past Medical History:  Diagnosis Date   Arthritis    Diabetes mellitus without complication (HCC)    New DX - no meds yet   Diverticular disease    Hypertension    Past Surgical History:  Procedure Laterality Date   CATARACT EXTRACTION     COLONOSCOPY N/A 10/07/2015   Procedure: COLONOSCOPY;  Surgeon: Rogelia Copping, MD;  Location: [facility];  Service: Endoscopy;  Laterality: N/A;   COLONOSCOPY WITH PROPOFOL  N/A 08/25/2021   Procedure: COLONOSCOPY WITH PROPOFOL ;  Surgeon: Copping Rogelia, MD;  Location: [facility];  Service: Endoscopy;  Laterality: N/A;   DILATION AND CURETTAGE OF UTERUS     LAPAROSCOPIC SIGMOID COLECTOMY N/A 11/15/2015   Procedure: LAPAROSCOPIC SIGMOID COLECTOMY possible open, possible colostomy;  Surgeon: Laneta JULIANNA Luna, MD;  Location: [facility] ORS;  Service: General;  Laterality: N/A;  Please have Hand port ( gelport green by [employer]) ready   TUBAL LIGATION     Family History  Problem Relation Age of Onset   Heart disease Mother    Hypertension Mother    Aneurysm Mother    Alcohol abuse Father    Bladder Cancer Sister    Hypertension Sister    Breast cancer Sister 61   Hypertension Sister    Diverticulitis Brother    Hypertension Brother    Hypertension Sister    Diabetes Paternal Grandmother    Heart disease Paternal Grandmother    Social History   Occupational History   Occupation: retired  Tobacco Use   Smoking status: Former    Current packs/day: 0.00    Types: Cigarettes    Quit date: 11/29/1990    Years since quitting: 33.3   Smokeless tobacco: Never   Tobacco comments:    quit 1992  Vaping Use   Vaping status: Never Used  Substance and Sexual Activity   Alcohol use: Yes    Alcohol/week: 1.0 - 2.0 standard drink of alcohol    Types: 1 - 2 Glasses  of wine per week   Drug use: No   Sexual activity: Not on file   Tobacco Counseling Counseling given: Not Answered Tobacco comments: quit 1992  SDOH Screenings   Food Insecurity: No Food Insecurity (12/30/2023)   [facility]  Housing: Low Risk  (12/30/2023)   Received from [facility]  Transportation Needs: No Transportation Needs (12/30/2023)   Received from [facility]  Utilities: Not At Risk (12/30/2023)   Received from [facility]  Alcohol Screen: Low Risk  (11/18/2022)  Depression (PHQ2-9): Low Risk  (02/24/2023)  Financial Resource Strain: Low Risk  (12/30/2023)   Received from [facility]  Physical Activity: Inactive (07/27/2022)  Social Connections: Socially Isolated (07/27/2022)  Stress: No Stress Concern Present (07/27/2022)  Tobacco Use: Medium Risk (04/10/2024)   See flowsheets for full screening details  Depression Screen     Goals Addressed   None    Functional Status Ambulation: Independent        Objective:    There were no vitals filed for this visit. There is no height or weight on file to calculate BMI.  Current Medications (verified) Outpatient Encounter Medications as of 04/12/2024  Medication Sig   Ascorbic Acid (VITAMIN C PO) Take by  mouth daily.   B Complex Vitamins (VITAMIN B COMPLEX PO) Take by mouth daily.   BIOTIN PO Take by mouth.   Calcium  Carb-Cholecalciferol 333-3.325 MG-MCG TABS Take by mouth. (Patient not taking: Reported on 04/12/2023)   Cholecalciferol (VITAMIN D) 2000 units CAPS Take by mouth daily.    COLLAGEN PO Take by mouth.   Garlic 1500 MG CAPS Take 3,000 mg by mouth. (Patient not taking: Reported on 04/10/2024)   Iron Combinations (IRON COMPLEX PO) Take by mouth. (Patient not taking: Reported on 04/10/2024)   lisinopril  (ZESTRIL ) 10 MG tablet Take 1 tablet (10 mg total) by mouth daily. Please schedule office visit before any future  refill.   lisinopril -hydrochlorothiazide  (ZESTORETIC ) 20-25 MG tablet TAKE 1 TABLET DAILY   Multiple Vitamin (MULTIVITAMIN WITH MINERALS) TABS tablet Take 1 tablet by mouth daily.   Omega-3 Fatty Acids (FISH OIL ODOR-LESS PO) Take by mouth. (Patient not taking: Reported on 04/10/2024)   rosuvastatin  (CRESTOR ) 5 MG tablet Take 1 tablet (5 mg total) by mouth daily.   VITAMIN A PO Take by mouth. (Patient not taking: Reported on 04/10/2024)   ZINC PICOLINATE PO Take by mouth. (Patient not taking: Reported on 04/10/2024)   No facility-administered encounter medications on file as of 04/12/2024.   Hearing/Vision screen No results found. Immunizations and Health Maintenance Health Maintenance  Topic Date Due   Zoster Vaccines- Shingrix (1 of 2) Never done   DTaP/Tdap/Td (2 - Td or Tdap) 10/15/2019   HEMOGLOBIN A1C  08/17/2023   Influenza Vaccine  Never done   COVID-19 Vaccine (1 - 2025-26 season) Never done   Diabetic kidney evaluation - Urine ACR  02/17/2024   Medicare Annual Wellness (AWV)  02/17/2024   Diabetic kidney evaluation - eGFR measurement  03/23/2024   Mammogram  03/16/2024   Bone Density Scan  09/09/2026   Pneumococcal Vaccine: 50+ Years  Completed   Hepatitis C Screening  Completed   Meningococcal B Vaccine  Aged Out   FOOT EXAM  Discontinued   OPHTHALMOLOGY EXAM  Discontinued   Colonoscopy  Discontinued        Assessment/Plan:  This is a routine wellness examination for Sadeel.  Patient Care Team: Donzella Lauraine SAILOR, DO as PCP - General (Family Medicine) Jama, Cordella MATSU, MD as Consulting Physician (Vascular Surgery) Isenstein, Arin L, MD as Consulting Physician (Dermatology) Pa, [facility] as Consulting Physician  I have personally reviewed and noted the following in the patient's chart:   Medical and social history Use of alcohol, tobacco or illicit drugs  Current medications and supplements including opioid prescriptions. Functional ability and  status Nutritional status Physical activity Advanced directives List of other physicians Hospitalizations, surgeries, and ER visits in previous 12 months Vitals Screenings to include cognitive, depression, and falls Referrals and appointments  No orders of the defined types were placed in this encounter.  In addition, I have reviewed and discussed with patient certain preventive protocols, quality metrics, and best practice recommendations. A written personalized care plan for preventive services as well as general preventive health recommendations were provided to patient.   Jhonnie GORMAN Das, LPN   88/80/7974   No follow-ups on file.  After Visit Summary: (In Person-Declined) Patient declined AVS at this time.  Nurse Notes: DECLINES FLU, SHINGRIX & COVID SHOTS; MAMMOGRAM ORDERED; AGED OUT OF COLONOSCOPY; UTD ON BDS

## 2024-04-17 ENCOUNTER — Encounter: Payer: Self-pay | Admitting: Family Medicine

## 2024-04-17 ENCOUNTER — Ambulatory Visit
Admission: RE | Admit: 2024-04-17 | Discharge: 2024-04-17 | Disposition: A | Discharge status: Home / Self Care | Source: Ambulatory Visit | Attending: Vascular Surgery | Admitting: Vascular Surgery

## 2024-04-17 ENCOUNTER — Ambulatory Visit: Admitting: Family Medicine

## 2024-04-17 VITALS — BP 177/73 | HR 54 | Temp 97.8°F | Ht 63.0 in | Wt 162.3 lb

## 2024-04-17 DIAGNOSIS — I10 Essential (primary) hypertension: Secondary | ICD-10-CM | POA: Diagnosis not present

## 2024-04-17 DIAGNOSIS — I6523 Occlusion and stenosis of bilateral carotid arteries: Secondary | ICD-10-CM | POA: Diagnosis not present

## 2024-04-17 DIAGNOSIS — D582 Other hemoglobinopathies: Secondary | ICD-10-CM

## 2024-04-17 DIAGNOSIS — R7303 Prediabetes: Secondary | ICD-10-CM

## 2024-04-17 DIAGNOSIS — N182 Chronic kidney disease, stage 2 (mild): Secondary | ICD-10-CM

## 2024-04-17 DIAGNOSIS — E1169 Type 2 diabetes mellitus with other specified complication: Secondary | ICD-10-CM

## 2024-04-17 DIAGNOSIS — E785 Hyperlipidemia, unspecified: Secondary | ICD-10-CM | POA: Diagnosis not present

## 2024-04-17 MED ORDER — LISINOPRIL 20 MG PO TABS: 20.0000 mg | ORAL_TABLET | Freq: Every day | ORAL | 3 refills | Status: DC

## 2024-04-17 MED ORDER — IOHEXOL 350 MG/ML SOLN
75.0000 mL | Freq: Once | INTRAVENOUS | Status: AC | PRN
  Administered 2024-04-17: 75 mL via INTRAVENOUS

## 2024-04-17 NOTE — Progress Notes (Signed)
 Established patient visit   Patient: Nicole Tucker   DOB: 05-Jan-1946   78 y.o. Female  MRN: [id number] Visit Date: 04/17/2024  Today's healthcare provider: LAURAINE LOISE BUOY, DO   Chief Complaint  Patient presents with   Annual Exam    Diet- General, try to manage low salt and not as much red meat Exercise- None but does yard wark Overall feeling- Fine Sleep- Fine Concerns- None  Declined vaccines   Subjective    HPI Nicole Tucker is a 78 year old female with hypertension who presents for a follow-up visit.  Her blood pressure was 132 mmHg at home. She has previously taken an extra 10 mg of lisinopril  before a doctor's visit, which she reports normalized her home blood pressure. Her current medication regimen includes lisinopril  30 mg daily, consisting of 20 mg, and 10 mg doses. She has not previously taken a total of 40 mg of lisinopril . No chest pain, shortness of breath, lightheadedness, or dizziness.  She experiences numbness and tingling in her two baby toes, which has been a long-standing issue.  She reports sneezing and nasal congestion every morning during the fall season.  She has not received her updated tetanus vaccine, shingles vaccine, or flu shot, expressing a general reluctance towards vaccinations. Two of her sisters have had shingles. She has not yet scheduled her mammogram.       Medications: Outpatient Medications Prior to Visit  Medication Sig   Ascorbic Acid (VITAMIN C PO) Take by mouth daily.   B Complex Vitamins (VITAMIN B COMPLEX PO) Take by mouth daily.   BIOTIN PO Take by mouth.   COLLAGEN PO Take by mouth.   lisinopril -hydrochlorothiazide  (ZESTORETIC ) 20-25 MG tablet TAKE 1 TABLET DAILY   Multiple Vitamin (MULTIVITAMIN WITH MINERALS) TABS tablet Take 1 tablet by mouth daily.   rosuvastatin  (CRESTOR ) 5 MG tablet Take 1 tablet (5 mg total) by mouth daily.   [DISCONTINUED] lisinopril  (ZESTRIL ) 10 MG tablet Take 1 tablet (10 mg total) by mouth  daily. Please schedule office visit before any future refill.   [DISCONTINUED] Calcium  Carb-Cholecalciferol 333-3.325 MG-MCG TABS Take by mouth. (Patient not taking: Reported on 04/12/2024)   [DISCONTINUED] Cholecalciferol (VITAMIN D) 2000 units CAPS Take by mouth daily.  (Patient not taking: Reported on 04/12/2024)   [DISCONTINUED] Garlic 1500 MG CAPS Take 3,000 mg by mouth. (Patient not taking: Reported on 04/12/2024)   [DISCONTINUED] Iron Combinations (IRON COMPLEX PO) Take by mouth. (Patient not taking: Reported on 04/12/2024)   [DISCONTINUED] Omega-3 Fatty Acids (FISH OIL ODOR-LESS PO) Take by mouth. (Patient not taking: Reported on 04/12/2024)   [DISCONTINUED] VITAMIN A PO Take by mouth. (Patient not taking: Reported on 04/12/2024)   [DISCONTINUED] ZINC PICOLINATE PO Take by mouth. (Patient not taking: Reported on 04/12/2024)   No facility-administered medications prior to visit.    Review of Systems  Constitutional:  Negative for chills, fatigue and fever.  HENT:  Positive for sneezing (attributes to seasonal allergies). Negative for congestion, ear pain, rhinorrhea and sore throat.   Eyes: Negative.  Negative for pain and redness.  Respiratory:  Negative for cough, shortness of breath and wheezing.   Cardiovascular:  Negative for chest pain and leg swelling.  Gastrointestinal:  Negative for abdominal pain, blood in stool, constipation, diarrhea and nausea.  Endocrine: Negative for polydipsia and polyphagia.  Genitourinary: Negative.  Negative for dysuria, flank pain, hematuria, pelvic pain, vaginal bleeding and vaginal discharge.  Musculoskeletal:  Negative for arthralgias, back pain, gait problem and  joint swelling.  Skin:  Negative for rash.  Neurological:  Positive for numbness (5th digits on feet bilaterally). Negative for dizziness, tremors, seizures, weakness, light-headedness and headaches.  Hematological:  Negative for adenopathy.  Psychiatric/Behavioral: Negative.  Negative  for behavioral problems, confusion and dysphoric mood. The patient is not nervous/anxious and is not hyperactive.         Objective    BP (!) 177/73 (BP Location: Right Arm, Patient Position: Sitting, Cuff Size: Normal)   Pulse (!) 54   Temp 97.8 F (36.6 C) (Oral)   Ht 5' 3 (1.6 m)   Wt 162 lb 4.8 oz (73.6 kg)   SpO2 99%   BMI 28.75 kg/m     Physical Exam Vitals and nursing note reviewed.  Constitutional:      General: She is awake.     Appearance: Normal appearance.  HENT:     Head: Normocephalic and atraumatic.     Right Ear: Tympanic membrane, ear canal and external ear normal.     Left Ear: Tympanic membrane, ear canal and external ear normal.     Nose: Nose normal.     Mouth/Throat:     Mouth: Mucous membranes are moist.     Pharynx: Oropharynx is clear. No oropharyngeal exudate or posterior oropharyngeal erythema.  Eyes:     General: No scleral icterus.    Extraocular Movements: Extraocular movements intact.     Conjunctiva/sclera: Conjunctivae normal.     Pupils: Pupils are equal, round, and reactive to light.  Neck:     Thyroid : No thyromegaly or thyroid  tenderness.  Cardiovascular:     Rate and Rhythm: Normal rate and regular rhythm.     Pulses: Normal pulses.     Heart sounds: Normal heart sounds.  Pulmonary:     Effort: Pulmonary effort is normal. No tachypnea, bradypnea or respiratory distress.     Breath sounds: Normal breath sounds. No stridor. No wheezing, rhonchi or rales.  Abdominal:     General: Bowel sounds are normal. There is no distension.     Palpations: Abdomen is soft. There is no mass.     Tenderness: There is no abdominal tenderness. There is no guarding.     Hernia: No hernia is present.  Musculoskeletal:     Cervical back: Normal range of motion and neck supple.     Right lower leg: No edema.     Left lower leg: No edema.  Lymphadenopathy:     Cervical: No cervical adenopathy.  Skin:    General: Skin is warm and dry.   Neurological:     Mental Status: She is alert and oriented to person, place, and time. Mental status is at baseline.  Psychiatric:        Mood and Affect: Mood normal.        Behavior: Behavior normal.      No results found for any visits on 04/17/24.  Assessment & Plan    Essential hypertension -     Comprehensive metabolic panel with GFR -     Lisinopril ; Take 1 tablet (20 mg total) by mouth daily. To be taken with lisinopril -hydrochlorothiazide  20-25 mg daily (total 40-25 mg total).  Dispense: 90 tablet; Refill: 3  Hyperlipidemia associated with type 2 diabetes mellitus (HCC) -     Lipid panel  Chronic kidney disease, stage 2 (mild) -     Microalbumin / creatinine urine ratio  Elevated hemoglobin -     CBC  Prediabetes -  Hemoglobin A1c      Essential hypertension Blood pressure elevated at 177/173 mmHg. Home reading 132 mmHg. Lisinopril  30 mg daily. Extra 10 mg improved home pressure today. No dizziness or lightheadedness. Discussed increased dosage benefits and hypotension monitoring. - Increased lisinopril  to 40 mg daily by adding 20 mg tablet to current regimen (lisinopril  20 mg and lisinopril -hydrochlorothiazide  20-25 mg daily). - Monitor blood pressure regularly and adjust dosage if hypotension symptoms occur. - Set aside 10 mg tablets for future use if symptoms of low blood pressure occur.  Hyperlipidemia associated with type 2 diabetes mellitus Chronic, stable on rosuvastatin  5 mg.  No changes today.  Recheck lipid panel.  Chronic kidney disease, stage 2 (mild) Noted. No acute concerns.  Continue to optimize risk factors.  Continue to monitor.   General Health Maintenance Has not received updated tetanus or shingles vaccine. Declines flu vaccine. Discussed tetanus vaccine importance due to pertussis cases. Encouraged shingles vaccine consideration due to family history. - Recommended tetanus, diphtheria, and pertussis vaccine at pharmacy. - Encouraged  consideration of shingles vaccine due to family history. - Provided information on flu vaccine availability at pharmacy.    Return in about 6 months (around 10/15/2024), or TOC (and HTN) with Dr. Franchot.      I discussed the assessment and treatment plan with the patient  The patient was provided an opportunity to ask questions and all were answered. The patient agreed with the plan and demonstrated an understanding of the instructions.   The patient was advised to call back or seek an in-person evaluation if the symptoms worsen or if the condition fails to improve as anticipated.    LAURAINE LOISE BUOY, DO  [facility] [phone] (phone) [phone] (fax)  [facility]

## 2024-04-17 NOTE — Patient Instructions (Addendum)
 Recommended vaccines to get at the pharmacy: Tdap (tetanus, diphtheria and pertussis).  Please call the [facility] [phone]) to schedule a routine screening mammogram.

## 2024-04-26 ENCOUNTER — Encounter (INDEPENDENT_AMBULATORY_CARE_PROVIDER_SITE_OTHER): Payer: Self-pay | Admitting: Vascular Surgery

## 2024-04-26 NOTE — Telephone Encounter (Signed)
 She should come in to discuss with Dr. Jama.  They aren't alarming so reassure her but let her know that we prefer to have patients come in to discuss results

## 2024-04-27 ENCOUNTER — Encounter: Payer: Self-pay | Admitting: Family Medicine

## 2024-04-29 ENCOUNTER — Other Ambulatory Visit: Payer: Self-pay | Admitting: Family Medicine

## 2024-04-29 DIAGNOSIS — I10 Essential (primary) hypertension: Secondary | ICD-10-CM

## 2024-05-03 ENCOUNTER — Ambulatory Visit
Admission: RE | Admit: 2024-05-03 | Discharge: 2024-05-03 | Disposition: A | Discharge status: Home / Self Care | Source: Ambulatory Visit | Attending: Family Medicine | Admitting: Family Medicine

## 2024-05-03 DIAGNOSIS — Z1231 Encounter for screening mammogram for malignant neoplasm of breast: Secondary | ICD-10-CM | POA: Diagnosis not present

## 2024-05-08 ENCOUNTER — Ambulatory Visit (INDEPENDENT_AMBULATORY_CARE_PROVIDER_SITE_OTHER): Admitting: Vascular Surgery

## 2024-05-08 ENCOUNTER — Encounter (INDEPENDENT_AMBULATORY_CARE_PROVIDER_SITE_OTHER): Payer: Self-pay | Admitting: Vascular Surgery

## 2024-05-08 VITALS — BP 150/74 | HR 60 | Resp 17 | Ht 63.0 in | Wt 164.2 lb

## 2024-05-08 DIAGNOSIS — E782 Mixed hyperlipidemia: Secondary | ICD-10-CM | POA: Diagnosis not present

## 2024-05-08 DIAGNOSIS — E119 Type 2 diabetes mellitus without complications: Secondary | ICD-10-CM

## 2024-05-08 DIAGNOSIS — I10 Essential (primary) hypertension: Secondary | ICD-10-CM | POA: Diagnosis not present

## 2024-05-08 DIAGNOSIS — I6523 Occlusion and stenosis of bilateral carotid arteries: Secondary | ICD-10-CM

## 2024-05-08 NOTE — Progress Notes (Unsigned)
 MRN : [id number]  Nicole Tucker is a 78 y.o. (June 20, 1945) female who presents with chief complaint of check carotid arteries.  History of Present Illness:   The patient is seen for follow up evaluation of carotid stenosis status post CT angiogram. CT scan was done 04/22/2024.  Patient reports that the test went well with no problems or complications.   The patient denies interval amaurosis fugax. There is no recent or interval TIA symptoms or focal motor deficits. There is no prior documented CVA.  The patient is taking enteric-coated aspirin 81 mg daily.  There is no history of migraine headaches. There is no history of seizures.  No recent shortening of the patient's walking distance or new symptoms consistent with claudication.  No history of rest pain symptoms. No new ulcers or wounds of the lower extremities have occurred.  There is no history of DVT, PE or superficial thrombophlebitis. No recent episodes of angina or shortness of breath documented.   CT angiogram is reviewed by me personally and shows 70-80% stenosis consistent with mixed plaque at the origin of the right internal carotid artery.  I believe the radiology report is significantly underestimating the degree of stenosis.  Active Medications[1]  Past Medical History:  Diagnosis Date   Arthritis    Diabetes mellitus without complication (HCC)    New DX - no meds yet   Diverticular disease    Hypertension     Past Surgical History:  Procedure Laterality Date   CATARACT EXTRACTION     COLONOSCOPY N/A 10/07/2015   Procedure: COLONOSCOPY;  Surgeon: Rogelia Copping, MD;  Location: [facility];  Service: Endoscopy;  Laterality: N/A;   COLONOSCOPY WITH PROPOFOL  N/A 08/25/2021   Procedure: COLONOSCOPY WITH PROPOFOL ;  Surgeon: Copping Rogelia, MD;  Location: [facility];  Service: Endoscopy;  Laterality: N/A;   DILATION AND CURETTAGE OF UTERUS     LAPAROSCOPIC SIGMOID COLECTOMY N/A  11/15/2015   Procedure: LAPAROSCOPIC SIGMOID COLECTOMY possible open, possible colostomy;  Surgeon: Laneta JULIANNA Luna, MD;  Location: [facility];  Service: General;  Laterality: N/A;  Please have Hand port ( gelport green by [employer] [facility]) ready   TUBAL LIGATION      Social History Social History[2]  Family History Family History  Problem Relation Age of Onset   Heart disease Mother    Hypertension Mother    Aneurysm Mother    Alcohol abuse Father    Bladder Cancer Sister    Hypertension Sister    Breast cancer Sister 67   Hypertension Sister    Diverticulitis Brother    Hypertension Brother    Hypertension Sister    Diabetes Paternal Grandmother    Heart disease Paternal Grandmother     Allergies[3]   REVIEW OF SYSTEMS (Negative unless checked)  Constitutional: [] Weight loss  [] Fever  [] Chills Cardiac: [] Chest pain   [] Chest pressure   [] Palpitations   [] Shortness of breath when laying flat   [] Shortness of breath with exertion. Vascular:  [x] Pain in legs with walking   [] Pain in legs at rest  [] History of DVT   [] Phlebitis   [] Swelling in legs   [] Varicose veins   [] Non-healing ulcers Pulmonary:   [] Uses home oxygen   [] Productive cough   [] Hemoptysis   [] Wheeze  [] COPD   [] Asthma Neurologic:  [] Dizziness   [] Seizures   [] History of stroke   []   History of TIA  [] Aphasia   [] Vissual changes   [] Weakness or numbness in arm   [] Weakness or numbness in leg Musculoskeletal:   [] Joint swelling   [] Joint pain   [] Low back pain Hematologic:  [] Easy bruising  [] Easy bleeding   [] Hypercoagulable state   [] Anemic Gastrointestinal:  [] Diarrhea   [] Vomiting  [] Gastroesophageal reflux/heartburn   [] Difficulty swallowing. Genitourinary:  [] Chronic kidney disease   [] Difficult urination  [] Frequent urination   [] Blood in urine Skin:  [] Rashes   [] Ulcers  Psychological:  [] History of anxiety   []  History of major depression.  Physical Examination  There were no vitals filed for this  visit. There is no height or weight on file to calculate BMI. Gen: WD/WN, NAD Head: [place]/AT, No temporalis wasting.  Ear/Nose/Throat: Hearing grossly intact, nares w/o erythema or drainage Eyes: PER, EOMI, sclera nonicteric.  Neck: Supple, no masses.  No bruit or JVD.  Pulmonary:  Good air movement, no audible wheezing, no use of accessory muscles.  Cardiac: RRR, normal S1, S2, no Murmurs. Vascular:  carotid bruit noted Vessel Right Left  Radial Palpable Palpable  Carotid  Palpable  Palpable  Subclav  Palpable Palpable  Gastrointestinal: soft, non-distended. No guarding/no peritoneal signs.  Musculoskeletal: M/S 5/5 throughout.  No visible deformity.  Neurologic: CN 2-12 intact. Pain and light touch intact in extremities.  Symmetrical.  Speech is fluent. Motor exam as listed above. Psychiatric: Judgment intact, Mood & affect appropriate for pt's clinical situation. Dermatologic: No rashes or ulcers noted.  No changes consistent with cellulitis.   CBC Lab Results  Component Value Date   WBC 14.6 (H) 02/02/2022   HGB 15.3 (H) 02/02/2022   HCT 43.6 02/02/2022   MCV 96.9 02/02/2022   PLT 220 02/02/2022    BMET    Component Value Date/Time   NA 139 03/24/2023 0950   NA 140 12/26/2011 1920   K 4.5 03/24/2023 0950   K 3.6 12/26/2011 1920   CL 97 03/24/2023 0950   CL 103 12/26/2011 1920   CO2 24 03/24/2023 0950   CO2 26 12/26/2011 1920   GLUCOSE 91 03/24/2023 0950   GLUCOSE 96 02/02/2022 0801   GLUCOSE 90 12/26/2011 1920   BUN 9 03/24/2023 0950   BUN 19 (H) 12/26/2011 1920   CREATININE 0.81 03/24/2023 0950   CREATININE 0.76 12/26/2011 1920   CALCIUM  9.7 03/24/2023 0950   CALCIUM  9.2 12/26/2011 1920   GFRNONAA >60 02/02/2022 0801   GFRNONAA >60 12/26/2011 1920   GFRAA 85 04/10/2019 1212   GFRAA >60 12/26/2011 1920   CrCl cannot be calculated (Patient's most recent lab result is older than the maximum 21 days allowed.).  COAG Lab Results  Component Value Date   INR  1.11 08/31/2015   INR 1.00 08/30/2015    Radiology CT ANGIO NECK W OR WO CONTRAST Result Date: 04/22/2024 EXAM: CTA Neck 04/17/2024 10:04:55 AM TECHNIQUE: CT of the neck was performed without and with the administration of intravenous contrast. Multiplanar 2D and/or 3D reformatted images are provided for review. Automated exposure control, iterative reconstruction, and/or weight based adjustment of the mA/kV was utilized to reduce the radiation dose to as low as reasonably achievable. Stenosis of the internal carotid arteries measured using NASCET criteria. CONTRAST: 75 mL of Omnipaque  350. COMPARISON: Carotid doppler dated 11/06/2016. CLINICAL HISTORY: Carotid artery stenosis, asymptomatic, history of Present Illness, history of coronary artery disease, history of hyperlipidemia, arthritis, diabetes mellitus without complication (HCC), diverticular disease, hypertension. FINDINGS: AORTIC ARCH AND ARCH VESSELS:  No dissection or arterial injury. There is mild calcific plaque within the aortic arch. There is standard 3-vessel takeoff of the great arteries. There is moderate calcific plaque present within the proximal right subclavian artery, with less than 20% luminal stenosis. No significant stenosis of the brachiocephalic arteries. CERVICAL CAROTID ARTERIES: No dissection or arterial injury. There is moderate calcific plaque present within the right carotid bulb and origin of the right internal carotid artery, resulting in approximately 50 to 60% luminal stenosis. There is calcific plaque present within the left carotid bulb and proximal internal carotid artery with less than 30% luminal stenosis. CERVICAL VERTEBRAL ARTERIES: No dissection, arterial injury, or significant stenosis. The vertebral arteries are codominant and normal in caliber throughout their respective courses. LUNGS AND MEDIASTINUM: Unremarkable. SOFT TISSUES: No acute abnormality. BONES: No acute abnormality. IMPRESSION: 1. Moderate calcific  plaque in the right carotid bulb and origin of the right internal carotid artery, resulting in approximately 50-60% luminal stenosis. 2. Calcific plaque in the left carotid bulb and proximal internal carotid artery, resulting in less than 30% luminal stenosis. 3. Moderate calcific plaque in the proximal right subclavian artery, resulting in less than 20% luminal stenosis. Electronically signed by: Evalene Coho MD 04/22/2024 08:19 AM EST RP Workstation: [id number]   VAS US  CAROTID Result Date: 04/12/2024 Carotid Arterial Duplex Study Patient Name:  Nicole Tucker  Date of Exam:   04/10/2024 Medical Rec #: [id number]    Accession #:    [id number] Date of Birth: 1945-06-07   Patient Gender: F Patient Age:   27 years Exam Location:  [facility] Procedure:      VAS US  CAROTID Referring Phys: CORDELLA SHAWL --------------------------------------------------------------------------------  Indications:       Carotid artery disease. Comparison Study:  09/2023 Performing Technologist: Jerel Croak RVT  Examination Guidelines: A complete evaluation includes B-mode imaging, spectral Doppler, color Doppler, and power Doppler as needed of all accessible portions of each vessel. Bilateral testing is considered an integral part of a complete examination. Limited examinations for reoccurring indications may be performed as noted.  Right Carotid Findings: +----------+--------+--------+--------+-------------------+--------------+           PSV cm/sEDV cm/sStenosisPlaque Description Comments       +----------+--------+--------+--------+-------------------+--------------+ CCA Prox  63      14                                                +----------+--------+--------+--------+-------------------+--------------+ CCA Mid   39      12                                                +----------+--------+--------+--------+-------------------+--------------+ CCA Distal40      13                                                 +----------+--------+--------+--------+-------------------+--------------+ ICA Prox  295     94      80-99%  calcific and smooth               +----------+--------+--------+--------+-------------------+--------------+ ICA Mid   113     31                                                +----------+--------+--------+--------+-------------------+--------------+  ICA Distal68      24                                                +----------+--------+--------+--------+-------------------+--------------+ ECA       31      2                                  near occlusion +----------+--------+--------+--------+-------------------+--------------+ +----------+--------+-------+----------------+-------------------+           PSV cm/sEDV cmsDescribe        Arm Pressure (mmHG) +----------+--------+-------+----------------+-------------------+ [id number]             Multiphasic, WNL                    +----------+--------+-------+----------------+-------------------+ +---------+--------+--+--------+-+---------+ VertebralPSV cm/s35EDV cm/s9Antegrade +---------+--------+--+--------+-+---------+ ICA/CCA ratio = 7.56 Left Carotid Findings: +----------+--------+--------+--------+-------------------+--------+           PSV cm/sEDV cm/sStenosisPlaque Description Comments +----------+--------+--------+--------+-------------------+--------+ CCA Prox  54      14                                          +----------+--------+--------+--------+-------------------+--------+ CCA Mid   48      13                                          +----------+--------+--------+--------+-------------------+--------+ CCA Distal49      14                                          +----------+--------+--------+--------+-------------------+--------+ ICA Prox  85      26                                           +----------+--------+--------+--------+-------------------+--------+ ICA Mid   92      26      1-39%   calcific and smooth         +----------+--------+--------+--------+-------------------+--------+ ICA Distal67      20                                          +----------+--------+--------+--------+-------------------+--------+ ECA       324     28      >50%    calcific and smooth         +----------+--------+--------+--------+-------------------+--------+ +----------+--------+--------+----------------+-------------------+           PSV cm/sEDV cm/sDescribe        Arm Pressure (mmHG) +----------+--------+--------+----------------+-------------------+ [id number]              Multiphasic, WNL                    +----------+--------+--------+----------------+-------------------+ +---------+--------+--+--------+--+---------+ VertebralPSV cm/s38EDV cm/s10Antegrade +---------+--------+--+--------+--+---------+ ICA/CCA ratio = 1.92  Summary: Right Carotid: Velocities in the right ICA are consistent with  a 80-99%                stenosis. Non-hemodynamically significant plaque <50% noted in                the CCA. ICA/CCA ratio = 7.56. Worsened compared to previous                study of 10/11/2023. ECA near occlusion. Left Carotid: Velocities in the left ICA are consistent with a 1-39% stenosis.               The ECA appears >50% stenosed. Vertebrals:  Bilateral vertebral arteries demonstrate antegrade flow. Subclavians: Normal flow hemodynamics were seen in bilateral subclavian              arteries. *See table(s) above for measurements and observations.  Electronically signed by Cordella Shawl MD on 04/12/2024 at 9:32:40 AM.    Final      Assessment/Plan There are no diagnoses linked to this encounter.   Cordella Shawl, MD  05/08/2024 11:39 AM      [1]  No outpatient medications have been marked as taking for the 05/08/24 encounter (Appointment) with Shawl,  Cordella MATSU, MD.  [2]  Social History Tobacco Use   Smoking status: Former    Current packs/day: 0.00    Types: Cigarettes    Quit date: 11/29/1990    Years since quitting: 33.4   Smokeless tobacco: Never   Tobacco comments:    quit 1992  Vaping Use   Vaping status: Never Used  Substance Use Topics   Alcohol use: Yes    Alcohol/week: 1.0 - 2.0 standard drink of alcohol    Types: 1 - 2 Glasses of wine per week   Drug use: No  [3]  Allergies Allergen Reactions   Codeine Itching and Anxiety    Other reaction(s): Other (See Comments) Nervous

## 2024-05-09 ENCOUNTER — Encounter (INDEPENDENT_AMBULATORY_CARE_PROVIDER_SITE_OTHER): Payer: Self-pay | Admitting: Vascular Surgery

## 2024-05-11 ENCOUNTER — Encounter: Payer: Self-pay | Admitting: Family Medicine

## 2024-05-11 ENCOUNTER — Other Ambulatory Visit: Payer: Self-pay

## 2024-05-11 DIAGNOSIS — I10 Essential (primary) hypertension: Secondary | ICD-10-CM

## 2024-05-15 ENCOUNTER — Ambulatory Visit: Payer: Self-pay | Admitting: Family Medicine

## 2024-05-26 ENCOUNTER — Other Ambulatory Visit: Payer: Self-pay

## 2024-05-26 DIAGNOSIS — I10 Essential (primary) hypertension: Secondary | ICD-10-CM

## 2024-05-26 MED ORDER — LISINOPRIL 10 MG PO TABS: 10.0000 mg | ORAL_TABLET | Freq: Every day | ORAL | 0 refills | Status: DC

## 2024-05-26 MED ORDER — LISINOPRIL-HYDROCHLOROTHIAZIDE 20-25 MG PO TABS: 1.0000 | ORAL_TABLET | Freq: Every day | ORAL | 0 refills | Status: AC

## 2024-05-29 ENCOUNTER — Encounter: Admitting: Family Medicine

## 2024-06-05 NOTE — Telephone Encounter (Signed)
 Dr. Jama would like her to come in to see him and discuss treatment.  However if this recurs or last longer, she should present to the ED

## 2024-06-07 NOTE — H&P (View-Only) (Signed)
 "                         MRN : [id number]  Nicole Tucker is a 79 y.o. (10-22-1945) female who presents with chief complaint of check carotid arteries.  History of Present Illness:   The patient is seen sooner than expected secondary to recurrence of her left facial numbness.  The patient was recently seen for follow up evaluation regarding her carotid stenosis status post CT angiogram. CT scan was done 04/22/2024.     The patient denies interval amaurosis fugax of the right eye.  She did note some blurriness or waviness of the left eye during the episode of facial numbness. There is no recent or interval  focal motor deficits. There is no prior documented CVA.   The patient is taking enteric-coated aspirin  81 mg daily.   No recent shortening of the patient's walking distance or new symptoms consistent with claudication.  No history of rest pain symptoms. No new ulcers or wounds of the lower extremities have occurred.   There is no history of DVT, PE or superficial thrombophlebitis. No recent episodes of angina or shortness of breath documented.    CT angiogram is reviewed by me personally and shows 70% stenosis consistent with mixed plaque at the origin of the right internal carotid artery.   Active Medications[1]  Past Medical History:  Diagnosis Date   Arthritis    Diabetes mellitus without complication (HCC)    New DX - no meds yet   Diverticular disease    Hypertension     Past Surgical History:  Procedure Laterality Date   CATARACT EXTRACTION     COLONOSCOPY N/A 10/07/2015   Procedure: COLONOSCOPY;  Surgeon: Rogelia Copping, Nicole Tucker;  Location: [facility];  Service: Endoscopy;  Laterality: N/A;   COLONOSCOPY WITH PROPOFOL  N/A 08/25/2021   Procedure: COLONOSCOPY WITH PROPOFOL ;  Surgeon: Copping Rogelia, Nicole Tucker;  Location: [facility];  Service: Endoscopy;  Laterality: N/A;   DILATION AND CURETTAGE OF UTERUS     LAPAROSCOPIC SIGMOID COLECTOMY N/A 11/15/2015   Procedure:  LAPAROSCOPIC SIGMOID COLECTOMY possible open, possible colostomy;  Surgeon: Laneta JULIANNA Luna, Nicole Tucker;  Location: [facility] OR[facility];  Service: General;  Laterality: N/A;  Please have Hand port ( gelport green by [employer] [facility]) ready   TUBAL LIGATION      Social History Social History[2]  Family History Family History  Problem Relation Age of Onset   Heart disease Mother    Hypertension Mother    Aneurysm Mother    Alcohol abuse Father    Bladder Cancer Sister    Hypertension Sister    Breast cancer Sister 34   Hypertension Sister    Diverticulitis Brother    Hypertension Brother    Hypertension Sister    Diabetes Paternal Grandmother    Heart disease Paternal Grandmother     Allergies[3]   REVIEW OF SYSTEMS (Negative unless checked)  Constitutional: [] Weight loss  [] Fever  [] Chills Cardiac: [] Chest pain   [] Chest pressure   [] Palpitations   [] Shortness of breath when laying flat   [] Shortness of breath with exertion. Vascular:  [x] Pain in legs with walking   [] Pain in legs at rest  [] History of DVT   [] Phlebitis   [] Swelling in legs   [] Varicose veins   [] Non-healing ulcers Pulmonary:   [] Uses home oxygen   [] Productive cough   [] Hemoptysis   [] Wheeze  [] COPD   [] Asthma Neurologic:  [] Dizziness   [] Seizures   []   History of stroke   [] History of TIA  [] Aphasia   [] Vissual changes   [] Weakness or numbness in arm   [] Weakness or numbness in leg Musculoskeletal:   [] Joint swelling   [] Joint pain   [] Low back pain Hematologic:  [] Easy bruising  [] Easy bleeding   [] Hypercoagulable state   [] Anemic Gastrointestinal:  [] Diarrhea   [] Vomiting  [] Gastroesophageal reflux/heartburn   [] Difficulty swallowing. Genitourinary:  [] Chronic kidney disease   [] Difficult urination  [] Frequent urination   [] Blood in urine Skin:  [] Rashes   [] Ulcers  Psychological:  [] History of anxiety   []  History of major depression.  Physical Examination  There were no vitals filed for this visit. There is no height or  weight on file to calculate BMI. Gen: WD/WN, NAD Head: [place]/AT, No temporalis wasting.  Ear/Nose/Throat: Hearing grossly intact, nares w/o erythema or drainage Eyes: PER, EOMI, sclera nonicteric.  Neck: Supple, no masses.  No bruit or JVD.  Pulmonary:  Good air movement, no audible wheezing, no use of accessory muscles.  Cardiac: RRR, normal S1, S2, no Murmurs. Vascular:  carotid bruit noted Vessel Right Left  Radial Palpable Palpable  Carotid  Palpable  Palpable  Gastrointestinal: soft, non-distended. No guarding/no peritoneal signs.  Musculoskeletal: M/S 5/5 throughout.  No visible deformity.  Neurologic: CN 2-12 intact. Pain and light touch intact in extremities.  Symmetrical.  Speech is fluent. Motor exam as listed above. Psychiatric: Judgment intact, Mood & affect appropriate for pt's clinical situation. Dermatologic: No rashes or ulcers noted.  No changes consistent with cellulitis.   CBC Lab Results  Component Value Date   WBC 14.6 (H) 02/02/2022   HGB 15.3 (H) 02/02/2022   HCT 43.6 02/02/2022   MCV 96.9 02/02/2022   PLT 220 02/02/2022    BMET    Component Value Date/Time   NA 139 03/24/2023 0950   NA 140 12/26/2011 1920   K 4.5 03/24/2023 0950   K 3.6 12/26/2011 1920   CL 97 03/24/2023 0950   CL 103 12/26/2011 1920   CO2 24 03/24/2023 0950   CO2 26 12/26/2011 1920   GLUCOSE 91 03/24/2023 0950   GLUCOSE 96 02/02/2022 0801   GLUCOSE 90 12/26/2011 1920   BUN 9 03/24/2023 0950   BUN 19 (H) 12/26/2011 1920   CREATININE 0.81 03/24/2023 0950   CREATININE 0.76 12/26/2011 1920   CALCIUM  9.7 03/24/2023 0950   CALCIUM  9.2 12/26/2011 1920   GFRNONAA >60 02/02/2022 0801   GFRNONAA >60 12/26/2011 1920   GFRAA 85 04/10/2019 1212   GFRAA >60 12/26/2011 1920   CrCl cannot be calculated (Patient's most recent lab result is older than the maximum 21 days allowed.).  COAG Lab Results  Component Value Date   INR 1.11 08/31/2015   INR 1.00 08/30/2015    Radiology No  results found.   Assessment/Plan 1. Bilateral carotid artery stenosis (Primary) Recommend:  The patient is symptomatic with respect to the carotid stenosis.  The patient now has progressed and has a lesion the is >70%.  I have completed Shared Decision-Making with Nicole Neeceprior to carotid surgery/intervention. The conversation included: -Discussion of all treatment options including carotid endarterectomy (CEA), carotid artery stenting (which includes transcarotid artery revascularization (TCAR)), and optimal medical therapy (OMT). -Explanation of risks and benefits for each option specific to Nicole Tucker's clinical situation. -Integration of clinical guidelines as it relates to the patient's history and comorbidities. -Discussion and incorporation of Nicole Tucker and their personal preferences and priorities in choosing a treatment plan plan  If the patient was unable to participate in Shared Decision Making this process was done with the patient.  Patient's CT angiography of the carotid arteries confirms >70% right ICA stenosis.  The anatomical considerations support stenting over surgery.  This was discussed in detail with the patient.  The risks, benefits and alternative therapies were reviewed in detail with the patient.  All questions were answered.  The patient agrees to proceed with stenting of the right carotid artery.  The patient's NIHSS score is as follows: 0 Mild: 1 - 5 Mild to Moderately Severe: 5 - 14 Severe: 15 - 24 Very Severe: >25  Continue antiplatelet therapy as prescribed. Continue management of CAD, HTN and Hyperlipidemia. Healthy heart diet, encouraged exercise at least 4 times per week.   2. Primary hypertension Continue antihypertensive medications as already ordered, these medications have been reviewed and there are no changes at this time.  3. Diabetes mellitus without complication (HCC) Continue hypoglycemic medications as already ordered, these  medications have been reviewed and there are no changes at this time.  Hgb A1C to be monitored as already arranged by primary service  4. Mixed hyperlipidemia Continue statin as ordered and reviewed, no changes at this time    Nicole Shawl, Nicole Tucker  06/07/2024 1:39 PM      [1]  No outpatient medications have been marked as taking for the 06/08/24 encounter (Appointment) with Shawl, Nicole MATSU, Nicole Tucker.  [2]  Social History Tobacco Use   Smoking status: Former    Current packs/day: 0.00    Types: Cigarettes    Quit date: 11/29/1990    Years since quitting: 33.5   Smokeless tobacco: Never   Tobacco comments:    quit 1992  Vaping Use   Vaping status: Never Used  Substance Use Topics   Alcohol use: Yes    Alcohol/week: 1.0 - 2.0 standard drink of alcohol    Types: 1 - 2 Glasses of wine per week   Drug use: No  [3]  Allergies Allergen Reactions   Codeine Itching and Anxiety    Other reaction(s): Other (See Comments) Nervous   "

## 2024-06-07 NOTE — Progress Notes (Unsigned)
 "                         MRN : [id number]  Nicole Tucker is a 79 y.o. (Jun 06, 1945) female who presents with chief complaint of check carotid arteries.  History of Present Illness:   The patient is seen sooner than expected secondary to recurrence of her left facial numbness.  The patient was recently seen for follow up evaluation regarding her carotid stenosis status post CT angiogram. CT scan was done 04/22/2024.     The patient denies interval amaurosis fugax. There is no recent or interval  focal motor deficits. There is no prior documented CVA.   The patient is taking enteric-coated aspirin 81 mg daily with Plavix  75 mg daily.   No recent shortening of the patient's walking distance or new symptoms consistent with claudication.  No history of rest pain symptoms. No new ulcers or wounds of the lower extremities have occurred.   There is no history of DVT, PE or superficial thrombophlebitis. No recent episodes of angina or shortness of breath documented.    CT angiogram is reviewed by me personally and shows 60-70% stenosis consistent with mixed plaque at the origin of the right internal carotid artery.   Active Medications[1]  Past Medical History:  Diagnosis Date   Arthritis    Diabetes mellitus without complication (HCC)    New DX - no meds yet   Diverticular disease    Hypertension     Past Surgical History:  Procedure Laterality Date   CATARACT EXTRACTION     COLONOSCOPY N/A 10/07/2015   Procedure: COLONOSCOPY;  Surgeon: Rogelia Copping, MD;  Location: [facility];  Service: Endoscopy;  Laterality: N/A;   COLONOSCOPY WITH PROPOFOL  N/A 08/25/2021   Procedure: COLONOSCOPY WITH PROPOFOL ;  Surgeon: Copping Rogelia, MD;  Location: [facility];  Service: Endoscopy;  Laterality: N/A;   DILATION AND CURETTAGE OF UTERUS     LAPAROSCOPIC SIGMOID COLECTOMY N/A 11/15/2015   Procedure: LAPAROSCOPIC SIGMOID COLECTOMY possible open, possible colostomy;  Surgeon: Laneta JULIANNA Luna,  MD;  Location: [facility];  Service: General;  Laterality: N/A;  Please have Hand port ( gelport green by [employer] [facility]) ready   TUBAL LIGATION      Social History Social History[2]  Family History Family History  Problem Relation Age of Onset   Heart disease Mother    Hypertension Mother    Aneurysm Mother    Alcohol abuse Father    Bladder Cancer Sister    Hypertension Sister    Breast cancer Sister 52   Hypertension Sister    Diverticulitis Brother    Hypertension Brother    Hypertension Sister    Diabetes Paternal Grandmother    Heart disease Paternal Grandmother     Allergies[3]   REVIEW OF SYSTEMS (Negative unless checked)  Constitutional: [] Weight loss  [] Fever  [] Chills Cardiac: [] Chest pain   [] Chest pressure   [] Palpitations   [] Shortness of breath when laying flat   [] Shortness of breath with exertion. Vascular:  [x] Pain in legs with walking   [] Pain in legs at rest  [] History of DVT   [] Phlebitis   [] Swelling in legs   [] Varicose veins   [] Non-healing ulcers Pulmonary:   [] Uses home oxygen   [] Productive cough   [] Hemoptysis   [] Wheeze  [] COPD   [] Asthma Neurologic:  [] Dizziness   [] Seizures   [] History of stroke   [] History of TIA  [] Aphasia   [] Vissual changes   []   Weakness or numbness in arm   [] Weakness or numbness in leg Musculoskeletal:   [] Joint swelling   [] Joint pain   [] Low back pain Hematologic:  [] Easy bruising  [] Easy bleeding   [] Hypercoagulable state   [] Anemic Gastrointestinal:  [] Diarrhea   [] Vomiting  [] Gastroesophageal reflux/heartburn   [] Difficulty swallowing. Genitourinary:  [] Chronic kidney disease   [] Difficult urination  [] Frequent urination   [] Blood in urine Skin:  [] Rashes   [] Ulcers  Psychological:  [] History of anxiety   []  History of major depression.  Physical Examination  There were no vitals filed for this visit. There is no height or weight on file to calculate BMI. Gen: WD/WN, NAD Head: [place]/AT, No temporalis wasting.   Ear/Nose/Throat: Hearing grossly intact, nares w/o erythema or drainage Eyes: PER, EOMI, sclera nonicteric.  Neck: Supple, no masses.  No bruit or JVD.  Pulmonary:  Good air movement, no audible wheezing, no use of accessory muscles.  Cardiac: RRR, normal S1, S2, no Murmurs. Vascular:  carotid bruit noted Vessel Right Left  Radial Palpable Palpable  Carotid  Palpable  Palpable  Subclav  Palpable Palpable  Gastrointestinal: soft, non-distended. No guarding/no peritoneal signs.  Musculoskeletal: M/S 5/5 throughout.  No visible deformity.  Neurologic: CN 2-12 intact. Pain and light touch intact in extremities.  Symmetrical.  Speech is fluent. Motor exam as listed above. Psychiatric: Judgment intact, Mood & affect appropriate for pt's clinical situation. Dermatologic: No rashes or ulcers noted.  No changes consistent with cellulitis.   CBC Lab Results  Component Value Date   WBC 14.6 (H) 02/02/2022   HGB 15.3 (H) 02/02/2022   HCT 43.6 02/02/2022   MCV 96.9 02/02/2022   PLT 220 02/02/2022    BMET    Component Value Date/Time   NA 139 03/24/2023 0950   NA 140 12/26/2011 1920   K 4.5 03/24/2023 0950   K 3.6 12/26/2011 1920   CL 97 03/24/2023 0950   CL 103 12/26/2011 1920   CO2 24 03/24/2023 0950   CO2 26 12/26/2011 1920   GLUCOSE 91 03/24/2023 0950   GLUCOSE 96 02/02/2022 0801   GLUCOSE 90 12/26/2011 1920   BUN 9 03/24/2023 0950   BUN 19 (H) 12/26/2011 1920   CREATININE 0.81 03/24/2023 0950   CREATININE 0.76 12/26/2011 1920   CALCIUM  9.7 03/24/2023 0950   CALCIUM  9.2 12/26/2011 1920   GFRNONAA >60 02/02/2022 0801   GFRNONAA >60 12/26/2011 1920   GFRAA 85 04/10/2019 1212   GFRAA >60 12/26/2011 1920   CrCl cannot be calculated (Patient's most recent lab result is older than the maximum 21 days allowed.).  COAG Lab Results  Component Value Date   INR 1.11 08/31/2015   INR 1.00 08/30/2015    Radiology No results found.   Assessment/Plan There are no diagnoses  linked to this encounter.   Cordella Shawl, MD  06/07/2024 1:39 PM      [1]  No outpatient medications have been marked as taking for the 06/08/24 encounter (Appointment) with Shawl, Cordella MATSU, MD.  [2]  Social History Tobacco Use   Smoking status: Former    Current packs/day: 0.00    Types: Cigarettes    Quit date: 11/29/1990    Years since quitting: 33.5   Smokeless tobacco: Never   Tobacco comments:    quit 1992  Vaping Use   Vaping status: Never Used  Substance Use Topics   Alcohol use: Yes    Alcohol/week: 1.0 - 2.0 standard drink of alcohol    Types:  1 - 2 Glasses of wine per week   Drug use: No  [3]  Allergies Allergen Reactions   Codeine Itching and Anxiety    Other reaction(s): Other (See Comments) Nervous   "

## 2024-06-08 ENCOUNTER — Ambulatory Visit (INDEPENDENT_AMBULATORY_CARE_PROVIDER_SITE_OTHER): Admitting: Vascular Surgery

## 2024-06-08 ENCOUNTER — Encounter (INDEPENDENT_AMBULATORY_CARE_PROVIDER_SITE_OTHER): Payer: Self-pay | Admitting: Vascular Surgery

## 2024-06-08 VITALS — BP 180/70 | HR 60 | Resp 17 | Ht 63.0 in | Wt 166.4 lb

## 2024-06-08 DIAGNOSIS — I6523 Occlusion and stenosis of bilateral carotid arteries: Secondary | ICD-10-CM

## 2024-06-08 DIAGNOSIS — E119 Type 2 diabetes mellitus without complications: Secondary | ICD-10-CM

## 2024-06-08 DIAGNOSIS — I10 Essential (primary) hypertension: Secondary | ICD-10-CM

## 2024-06-08 DIAGNOSIS — E782 Mixed hyperlipidemia: Secondary | ICD-10-CM

## 2024-06-08 MED ORDER — CLOPIDOGREL BISULFATE 75 MG PO TABS: 75.0000 mg | ORAL_TABLET | Freq: Every day | ORAL | 5 refills | Status: DC

## 2024-06-12 ENCOUNTER — Other Ambulatory Visit (INDEPENDENT_AMBULATORY_CARE_PROVIDER_SITE_OTHER): Payer: Self-pay

## 2024-06-12 MED ORDER — CLOPIDOGREL BISULFATE 75 MG PO TABS: 75.0000 mg | ORAL_TABLET | Freq: Every day | ORAL | 5 refills | Status: DC

## 2024-06-12 NOTE — Progress Notes (Signed)
 Per pt request sent in refills to [facility] on [address] and not [facility].   Dois Seip CMA

## 2024-06-15 ENCOUNTER — Telehealth (INDEPENDENT_AMBULATORY_CARE_PROVIDER_SITE_OTHER): Payer: Self-pay

## 2024-06-15 NOTE — Telephone Encounter (Signed)
 Spoke with the patient and she is scheduled with Dr. Jama on 06/27/24 with a 9:00 am arrival time to the [facility] for a right carotid stent placement. Pre-procedure instructions were discussed and will be sent to Mychart and mailed.

## 2024-06-27 ENCOUNTER — Inpatient Hospital Stay
Admission: RE | Admit: 2024-06-27 | Discharge: 2024-06-28 | DRG: 036 | Disposition: A | Attending: Vascular Surgery | Admitting: Vascular Surgery

## 2024-06-27 ENCOUNTER — Other Ambulatory Visit: Payer: Self-pay

## 2024-06-27 ENCOUNTER — Encounter: Payer: Self-pay | Admitting: Vascular Surgery

## 2024-06-27 ENCOUNTER — Encounter: Admission: RE | Payer: Self-pay | Source: Home / Self Care

## 2024-06-27 DIAGNOSIS — E119 Type 2 diabetes mellitus without complications: Secondary | ICD-10-CM | POA: Diagnosis present

## 2024-06-27 DIAGNOSIS — I10 Essential (primary) hypertension: Secondary | ICD-10-CM | POA: Diagnosis present

## 2024-06-27 DIAGNOSIS — Z006 Encounter for examination for normal comparison and control in clinical research program: Secondary | ICD-10-CM

## 2024-06-27 DIAGNOSIS — I2510 Atherosclerotic heart disease of native coronary artery without angina pectoris: Secondary | ICD-10-CM | POA: Diagnosis present

## 2024-06-27 DIAGNOSIS — Z8673 Personal history of transient ischemic attack (TIA), and cerebral infarction without residual deficits: Secondary | ICD-10-CM

## 2024-06-27 DIAGNOSIS — Z8249 Family history of ischemic heart disease and other diseases of the circulatory system: Secondary | ICD-10-CM

## 2024-06-27 DIAGNOSIS — Z9049 Acquired absence of other specified parts of digestive tract: Secondary | ICD-10-CM

## 2024-06-27 DIAGNOSIS — Z833 Family history of diabetes mellitus: Secondary | ICD-10-CM

## 2024-06-27 DIAGNOSIS — Z7982 Long term (current) use of aspirin: Secondary | ICD-10-CM

## 2024-06-27 DIAGNOSIS — I959 Hypotension, unspecified: Secondary | ICD-10-CM | POA: Diagnosis not present

## 2024-06-27 DIAGNOSIS — Z87891 Personal history of nicotine dependence: Secondary | ICD-10-CM

## 2024-06-27 DIAGNOSIS — E782 Mixed hyperlipidemia: Secondary | ICD-10-CM | POA: Diagnosis present

## 2024-06-27 DIAGNOSIS — I6521 Occlusion and stenosis of right carotid artery: Principal | ICD-10-CM | POA: Diagnosis present

## 2024-06-27 LAB — POCT ACTIVATED CLOTTING TIME: Activated Clotting Time: 312 s

## 2024-06-27 LAB — CREATININE, SERUM
Creatinine, Ser: 0.71 mg/dL (ref 0.44–1.00)
GFR, Estimated: >60 mL/min

## 2024-06-27 LAB — BUN: BUN: 10 mg/dL (ref 8–23)

## 2024-06-27 LAB — GLUCOSE, CAPILLARY: Glucose-Capillary: 127 mg/dL — ABNORMAL HIGH (ref 70–99)

## 2024-06-27 MED ORDER — MIDAZOLAM HCL 2 MG/ML PO SYRP: 8.0000 mg | ORAL_SOLUTION | Freq: Once | ORAL | Status: DC | PRN

## 2024-06-27 MED ORDER — HYDROMORPHONE HCL 1 MG/ML IJ SOLN: 1.0000 mg | Freq: Once | INTRAMUSCULAR | Status: DC | PRN

## 2024-06-27 MED ORDER — MIDAZOLAM HCL 2 MG/2ML IJ SOLN
INTRAMUSCULAR | Status: AC
  Filled 2024-06-27: qty 2

## 2024-06-27 MED ORDER — DOPAMINE-DEXTROSE 3.2-5 MG/ML-% IV SOLN
INTRAVENOUS | Status: AC
  Administered 2024-06-27: 6 ug/kg/min via INTRAVENOUS
  Filled 2024-06-27: qty 250

## 2024-06-27 MED ORDER — IODIXANOL 320 MG/ML IV SOLN
INTRAVENOUS | Status: DC | PRN
  Administered 2024-06-27: 60 mL

## 2024-06-27 MED ORDER — FENTANYL CITRATE (PF) 100 MCG/2ML IJ SOLN
INTRAMUSCULAR | Status: DC | PRN
  Administered 2024-06-27: 50 ug via INTRAVENOUS
  Administered 2024-06-27: 12.5 ug via INTRAVENOUS
  Administered 2024-06-27: 25 ug via INTRAVENOUS

## 2024-06-27 MED ORDER — LISINOPRIL-HYDROCHLOROTHIAZIDE 20-25 MG PO TABS: 1.0000 | ORAL_TABLET | Freq: Every day | ORAL | Status: DC

## 2024-06-27 MED ORDER — PHENOL 1.4 % MT LIQD: 1.0000 | OROMUCOSAL | Status: DC | PRN

## 2024-06-27 MED ORDER — SODIUM CHLORIDE 0.9 % IV SOLN: INTRAVENOUS | Status: AC

## 2024-06-27 MED ORDER — ATROPINE SULFATE 1 MG/10ML IJ SOSY
PREFILLED_SYRINGE | INTRAMUSCULAR | Status: AC
  Filled 2024-06-27: qty 10

## 2024-06-27 MED ORDER — LABETALOL HCL 5 MG/ML IV SOLN: 10.0000 mg | INTRAVENOUS | Status: DC | PRN

## 2024-06-27 MED ORDER — CLOPIDOGREL BISULFATE 75 MG PO TABS
75.0000 mg | ORAL_TABLET | Freq: Every day | ORAL | Status: DC
  Administered 2024-06-27 – 2024-06-28 (×2): 75 mg via ORAL
  Filled 2024-06-27 (×2): qty 1

## 2024-06-27 MED ORDER — PHENYLEPHRINE 80 MCG/ML (10ML) SYRINGE FOR IV PUSH (FOR BLOOD PRESSURE SUPPORT)
PREFILLED_SYRINGE | INTRAVENOUS | Status: AC
  Filled 2024-06-27: qty 10

## 2024-06-27 MED ORDER — HYDRALAZINE HCL 20 MG/ML IJ SOLN: 5.0000 mg | INTRAMUSCULAR | Status: DC | PRN

## 2024-06-27 MED ORDER — HEPARIN SODIUM (PORCINE) 1000 UNIT/ML IJ SOLN
INTRAMUSCULAR | Status: AC
  Filled 2024-06-27: qty 10

## 2024-06-27 MED ORDER — CEFAZOLIN SODIUM-DEXTROSE 2-4 GM/100ML-% IV SOLN
2.0000 g | Freq: Three times a day (TID) | INTRAVENOUS | Status: AC
  Administered 2024-06-27 – 2024-06-28 (×2): 2 g via INTRAVENOUS
  Filled 2024-06-27 (×2): qty 100

## 2024-06-27 MED ORDER — LISINOPRIL 5 MG PO TABS
30.0000 mg | ORAL_TABLET | Freq: Every day | ORAL | Status: DC
  Filled 2024-06-27: qty 2

## 2024-06-27 MED ORDER — ATROPINE SULFATE 1 MG/10ML IJ SOSY
PREFILLED_SYRINGE | INTRAMUSCULAR | Status: DC | PRN
  Administered 2024-06-27: 1 mg via INTRAVENOUS

## 2024-06-27 MED ORDER — ADULT MULTIVITAMIN W/MINERALS CH
1.0000 | ORAL_TABLET | Freq: Every day | ORAL | Status: DC
  Administered 2024-06-27 – 2024-06-28 (×2): 1 via ORAL
  Filled 2024-06-27 (×2): qty 1

## 2024-06-27 MED ORDER — DIPHENHYDRAMINE HCL 50 MG/ML IJ SOLN: 50.0000 mg | Freq: Once | INTRAMUSCULAR | Status: DC | PRN

## 2024-06-27 MED ORDER — FENTANYL CITRATE (PF) 100 MCG/2ML IJ SOLN
INTRAMUSCULAR | Status: AC
  Filled 2024-06-27: qty 2

## 2024-06-27 MED ORDER — MORPHINE SULFATE (PF) 2 MG/ML IV SOLN: 2.0000 mg | INTRAVENOUS | Status: DC | PRN

## 2024-06-27 MED ORDER — ASPIRIN 81 MG PO TBEC
81.0000 mg | DELAYED_RELEASE_TABLET | Freq: Every day | ORAL | Status: DC
  Administered 2024-06-28: 81 mg via ORAL
  Filled 2024-06-27: qty 1

## 2024-06-27 MED ORDER — DOCUSATE SODIUM 100 MG PO CAPS
100.0000 mg | ORAL_CAPSULE | Freq: Every day | ORAL | Status: DC
  Filled 2024-06-27: qty 1

## 2024-06-27 MED ORDER — HEPARIN (PORCINE) IN NACL 1000-0.9 UT/500ML-% IV SOLN
INTRAVENOUS | Status: DC | PRN
  Administered 2024-06-27: 2000 mL

## 2024-06-27 MED ORDER — METHYLPREDNISOLONE SODIUM SUCC 125 MG IJ SOLR: 125.0000 mg | Freq: Once | INTRAMUSCULAR | Status: DC | PRN

## 2024-06-27 MED ORDER — SODIUM CHLORIDE 0.9 % IV SOLN
INTRAVENOUS | Status: AC | PRN
  Administered 2024-06-27: 500 mL via INTRAVENOUS

## 2024-06-27 MED ORDER — SODIUM CHLORIDE 0.9 % IV SOLN
500.0000 mL | Freq: Once | INTRAVENOUS | Status: AC | PRN
  Administered 2024-06-27: 500 mL via INTRAVENOUS

## 2024-06-27 MED ORDER — METOPROLOL TARTRATE 5 MG/5ML IV SOLN: 2.5000 mg | INTRAVENOUS | Status: DC | PRN

## 2024-06-27 MED ORDER — LISINOPRIL 10 MG PO TABS: 10.0000 mg | ORAL_TABLET | Freq: Every day | ORAL | Status: DC

## 2024-06-27 MED ORDER — CEFAZOLIN SODIUM-DEXTROSE 2-4 GM/100ML-% IV SOLN
INTRAVENOUS | Status: AC
  Filled 2024-06-27: qty 100

## 2024-06-27 MED ORDER — ASPIRIN 325 MG PO TABS
325.0000 mg | ORAL_TABLET | ORAL | Status: AC
  Administered 2024-06-27: 325 mg via ORAL
  Filled 2024-06-27: qty 1

## 2024-06-27 MED ORDER — FAMOTIDINE 20 MG PO TABS: 40.0000 mg | ORAL_TABLET | Freq: Once | ORAL | Status: DC | PRN

## 2024-06-27 MED ORDER — SODIUM CHLORIDE 0.9 % IV SOLN: INTRAVENOUS | Status: DC

## 2024-06-27 MED ORDER — DOPAMINE-DEXTROSE 3.2-5 MG/ML-% IV SOLN: 0.0000 ug/kg/min | INTRAVENOUS | Status: DC

## 2024-06-27 MED ORDER — CEFAZOLIN SODIUM-DEXTROSE 2-4 GM/100ML-% IV SOLN
2.0000 g | INTRAVENOUS | Status: AC
  Administered 2024-06-27: 2 g via INTRAVENOUS

## 2024-06-27 MED ORDER — ROSUVASTATIN CALCIUM 5 MG PO TABS
5.0000 mg | ORAL_TABLET | Freq: Every day | ORAL | Status: DC
  Administered 2024-06-27 – 2024-06-28 (×2): 5 mg via ORAL
  Filled 2024-06-27 (×2): qty 1

## 2024-06-27 MED ORDER — ONDANSETRON HCL 4 MG/2ML IJ SOLN: 4.0000 mg | Freq: Four times a day (QID) | INTRAMUSCULAR | Status: DC | PRN

## 2024-06-27 MED ORDER — VITAMIN C 500 MG PO TABS
500.0000 mg | ORAL_TABLET | Freq: Every day | ORAL | Status: DC
  Administered 2024-06-27 – 2024-06-28 (×2): 500 mg via ORAL
  Filled 2024-06-27 (×2): qty 1

## 2024-06-27 MED ORDER — OXYCODONE HCL 5 MG PO TABS: 5.0000 mg | ORAL_TABLET | ORAL | Status: DC | PRN

## 2024-06-27 MED ORDER — ACETAMINOPHEN 325 MG PO TABS: 325.0000 mg | ORAL_TABLET | ORAL | Status: DC | PRN

## 2024-06-27 MED ORDER — HYDROCHLOROTHIAZIDE 25 MG PO TABS
25.0000 mg | ORAL_TABLET | Freq: Every day | ORAL | Status: DC
  Filled 2024-06-27: qty 1

## 2024-06-27 MED ORDER — HEPARIN SODIUM (PORCINE) 1000 UNIT/ML IJ SOLN
INTRAMUSCULAR | Status: DC | PRN
  Administered 2024-06-27: 9000 [IU] via INTRAVENOUS

## 2024-06-27 MED ORDER — MIDAZOLAM HCL (PF) 2 MG/2ML IJ SOLN
INTRAMUSCULAR | Status: DC | PRN
  Administered 2024-06-27: 1 mg via INTRAVENOUS
  Administered 2024-06-27: 2 mg via INTRAVENOUS
  Administered 2024-06-27: .5 mg via INTRAVENOUS

## 2024-06-27 MED ORDER — ACETAMINOPHEN 325 MG RE SUPP: 325.0000 mg | RECTAL | Status: DC | PRN

## 2024-06-27 MED ORDER — B COMPLEX-C PO TABS
1.0000 | ORAL_TABLET | Freq: Every day | ORAL | Status: DC
  Administered 2024-06-27 – 2024-06-28 (×2): 1 via ORAL
  Filled 2024-06-27 (×2): qty 1

## 2024-06-27 MED ORDER — POTASSIUM CHLORIDE CRYS ER 20 MEQ PO TBCR: 40.0000 meq | EXTENDED_RELEASE_TABLET | Freq: Every day | ORAL | Status: DC | PRN

## 2024-06-27 MED ORDER — LIDOCAINE HCL (PF) 1 % IJ SOLN
INTRAMUSCULAR | Status: DC | PRN
  Administered 2024-06-27: 10 mL

## 2024-06-27 NOTE — Op Note (Signed)
 "   OPERATIVE NOTE DATE: 06/27/2024  PROCEDURE:  Ultrasound guidance for vascular access right femoral artery  Placement of a 8 x 6 x 40 exact stent with the use of the NAV-6 embolic protection device in the right internal carotid artery  PRE-OPERATIVE DIAGNOSIS: 1.  Symptomatic right carotid artery stenosis without infarct. 2.  Multiple TIAs  POST-OPERATIVE DIAGNOSIS:  Same as above  SURGEON: Cordella Shawl  ASSISTANT(S): None  ANESTHESIA: local/MCS  ESTIMATED BLOOD LOSS: 25 cc  CONTRAST: 60 cc  FLUORO TIME: 7.6 minutes  MODERATE CONSCIOUS SEDATION TIME: Continuous ECG pulse oximetry and cardiopulmonary monitoring was performed throughout the entire procedure by the interventional radiology nurse total sedation time was 53 minutes and 18 seconds  FINDING(S): 1.   Greater than 80% carotid artery stenosis located at the origin of the right internal carotid artery in association with more distal FMD  SPECIMEN(S):   none  INDICATIONS:   Patient is a 79 y.o. female who presents with symptomatic right internal carotid artery stenosis.  The patient has a high bifurcation in association with very limited neck mobility and carotid artery stenting was felt to be preferred to endarterectomy for that reason.  Risks and benefits were discussed and informed consent was obtained.   DESCRIPTION: After obtaining full informed written consent, the patient was brought back to the vascular suite and placed supine upon the table.  The patient received IV antibiotics prior to induction. Moderate conscious sedation was administered during a face to face encounter with the patient throughout the procedure with my supervision of the RN administering medicines and monitoring the patients vital signs and mental status throughout from the start of the procedure until the patient was taken to the recovery room.    After obtaining adequate sedation, the patient was prepped and draped in the standard  fashion.    The right femoral artery was visualized with ultrasound and found to be widely patent. It was then accessed under direct ultrasound guidance without difficulty with a micropuncture needle. A permanent image was recorded.  A microwire was then advanced without difficulty under fluoroscopic guidance followed by a micro-sheath.  A J-wire was placed and we then placed a 6 French sheath. The patient was then heparinized and a total of 9000 units of intravenous heparin  were given and an ACT was checked to confirm successful anticoagulation, ACT returned 312..   A pigtail catheter was then placed into the ascending aorta. This showed type II aortic arch with patency of the great vessels at their origins. The innominate artery was then selectively cannulated without difficulty with a H1 catheter and the catheter advanced into the mid right common carotid artery.  Cervical and cerebral carotid angiography was then performed. There were no obvious intracranial filling defects. The carotid bifurcation demonstrated greater than 80% stenosis at the origin of the right internal carotid artery associated with significant calcification.  Additionally, there is a short segment of FMD located in the more distal right internal carotid artery.  I then advanced into the distal common carotid artery with a Glidewire (as the right external carotid artery was occluded at its origin) and the H1 catheter and then exchanged for the Amplatz Super Stiff wire. Over the Amplatz Super Stiff wire, a 6 French shuttle sheath was placed into the mid common carotid artery. I then used the NAV-6  Embolic protection device and crossed the lesion and parked this in the distal internal carotid artery at the base of the skull.  I then  selected a 8 mm x 6 mm x 40 mm exact stent. This was deployed across the lesion encompassing it in its entirety. A 4 mm x 30 mm length balloon was used to post dilate the stent. Only about a 10% residual  stenosis was present after angioplasty. Completion angiogram showed normal intracranial filling without new defects. At this point I elected to terminate the procedure. The sheath was removed and StarClose closure device was deployed in the right femoral artery with excellent hemostatic result. The patient was taken to the recovery room in stable condition having tolerated the procedure well.  COMPLICATIONS: none  CONDITION: stable  Cordella Shawl 06/27/2024 12:03 PM   This note was created with Dragon Medical transcription system. Any errors in dictation are purely unintentional.      "

## 2024-06-27 NOTE — Progress Notes (Addendum)
 Pre-procedure, patient squeezes ball on verbal command Neuro intact

## 2024-06-27 NOTE — Interval H&P Note (Signed)
 History and Physical Interval Note:  06/27/2024 9:00 AM  Nicole Tucker  has presented today for surgery, with the diagnosis of R Carotid Stent   ABBOTT   Carotid artery stenosis.  The various methods of treatment have been discussed with the patient and family. After consideration of risks, benefits and other options for treatment, the patient has consented to  Procedures: CAROTID PTA/STENT INTERVENTION (Right) as a surgical intervention.  The patient's history has been reviewed, patient examined, no change in status, stable for surgery.  I have reviewed the patient's chart and labs.  Questions were answered to the patient's satisfaction.     Cordella Shawl

## 2024-06-28 ENCOUNTER — Encounter: Payer: Self-pay | Admitting: Family Medicine

## 2024-06-28 LAB — BASIC METABOLIC PANEL WITH GFR
Anion gap: 10 (ref 5–15)
BUN: 15 mg/dL (ref 8–23)
CO2: 25 mmol/L (ref 22–32)
Calcium: 8.7 mg/dL — ABNORMAL LOW (ref 8.9–10.3)
Chloride: 100 mmol/L (ref 98–111)
Creatinine, Ser: 0.73 mg/dL (ref 0.44–1.00)
GFR, Estimated: >60 mL/min
Glucose, Bld: 94 mg/dL (ref 70–99)
Potassium: 4 mmol/L (ref 3.5–5.1)
Sodium: 135 mmol/L (ref 135–145)

## 2024-06-28 LAB — CBC
HCT: 35.5 % — ABNORMAL LOW (ref 36.0–46.0)
Hemoglobin: 12.3 g/dL (ref 12.0–15.0)
MCH: 33.3 pg (ref 26.0–34.0)
MCHC: 34.6 g/dL (ref 30.0–36.0)
MCV: 96.2 fL (ref 80.0–100.0)
Platelets: 211 10*3/uL (ref 150–400)
RBC: 3.69 MIL/uL — ABNORMAL LOW (ref 3.87–5.11)
RDW: 12.3 % (ref 11.5–15.5)
WBC: 8.2 10*3/uL (ref 4.0–10.5)
nRBC: 0 % (ref 0.0–0.2)

## 2024-06-28 NOTE — TOC Initial Note (Signed)
 Transition of Care [facility]) - Initial/Assessment Note    Patient Details  Name: Nicole Tucker MRN: [id number] Date of Birth: 06/26/1945  Transition of Care [facility]) CM/SW Contact:    [name], LCSW Phone Number: 06/28/2024, 12:29 PM  Clinical Narrative:                     Barriers to Discharge: Continued Medical Work up   Patient Goals and CMS Choice  Chart reviewed. I was able to speak with the patient at bedside today. I introduced myself, my role, and reason for visit. The patient reports that she has a PCP. The patient reports that she lives with her friend, [name]. The patient reports that she is able to complete task independently. The patient reports that she drives herself to medical appointments. The patient reports that her friend [name] will assist her during D/C. The [facility].   The patient reports that she has never has HH or been admitted into a SNF in the past. The patient reports that she does not have any equipment in the home.    The patient did not have any questions or concerns during the time of the assessment.       Expected Discharge Plan and Services       Living arrangements for the past 2 months: Single Family Home                                      Prior Living Arrangements/Services Living arrangements for the past 2 months: Single Family Home Lives with:: Friends Patient language and need for interpreter reviewed:: Yes        Need for Family Participation in Patient Care: Yes (Comment)     Criminal Activity/Legal Involvement Pertinent to Current Situation/Hospitalization: No - Comment as needed  Activities of Daily Living   ADL Screening (condition at time of admission) Independently performs ADLs?: Yes (appropriate for developmental age) Is the patient deaf or have difficulty hearing?: No Does the patient have difficulty seeing, even when wearing glasses/contacts?:  No Does the patient have difficulty concentrating, remembering, or making decisions?: No  Permission Sought/Granted                  Emotional Assessment Appearance:: Appears stated age Attitude/Demeanor/Rapport: Gracious Affect (typically observed): Calm Orientation: : Oriented to Self, Oriented to Place, Oriented to  Time, Oriented to Situation Alcohol / Substance Use: Not Applicable    Admission diagnosis:  Carotid stenosis, right [I65.21] Symptomatic carotid artery stenosis without infarction, right [I65.21] Patient Active Problem List   Diagnosis Date Noted   Symptomatic carotid artery stenosis without infarction, right 06/27/2024   Hyperlipidemia 04/12/2023   Otitis externa, fungal, both ears 02/24/2023   Chronic kidney disease, stage 2 (mild) 02/17/2023   Impacted cerumen, bilateral 02/17/2023   Gross hematuria 11/18/2022   Dysuria 11/18/2022   Acute cystitis with hematuria 11/18/2022   Special screening for malignant neoplasms, colon    Elevated hemoglobin 07/22/2021   Elevated liver enzymes 07/22/2021   Leg paresthesia 07/22/2021   Osteopenia of left hip 07/22/2021   Diabetes mellitus without complication (HCC) 07/19/2020   Pain of left sacroiliac joint 07/05/2019   Bilateral carotid artery stenosis 11/19/2016   Diverticulitis large intestine 11/15/2015   Other intestinal obstruction    Diverticulitis of large intestine without perforation or abscess  without bleeding    Biceps tendinitis 08/14/2015   Impingement syndrome of shoulder region 08/14/2015   Anxiety 07/03/2015   Diverticulitis 07/03/2015   Primary hypertension 07/03/2015   Colon polyp 07/03/2015   Prediabetes 07/03/2015   Calculus of kidney 10/23/2013   Microscopic hematuria 10/23/2013   PCP:  Donzella Lauraine SAILOR, DO Pharmacy:   [facility] DELIVERY - [place], [place] - [address] [place] [address] Phone: [phone] Fax: [phone]  [facility]  Pharmacy [address]), [place] - [address] [place]) [place] [address] Phone: [phone] Fax: [phone]     Social Drivers of Health (SDOH) Social History: SDOH Screenings   Food Insecurity: No Food Insecurity (06/27/2024)  Housing: Patient Declined (06/27/2024)  Transportation Needs: Patient Declined (06/27/2024)  Utilities: Patient Declined (06/27/2024)  Alcohol Screen: Low Risk (11/18/2022)  Depression (PHQ2-9): Low Risk (04/17/2024)  Financial Resource Strain: Low Risk  (12/30/2023)   Received from [facility]  Physical Activity: Insufficiently Active (04/12/2024)  Social Connections: Patient Declined (06/27/2024)  Recent Concern: Social Connections - Socially Isolated (04/12/2024)  Stress: No Stress Concern Present (04/12/2024)  Tobacco Use: Medium Risk (06/08/2024)  Health Literacy: Adequate Health Literacy (04/12/2024)   SDOH Interventions: Food Insecurity Interventions: Intervention Not Indicated Housing Interventions: Intervention Not Indicated Transportation Interventions: Intervention Not Indicated Utilities Interventions: Patient Declined Social Connections Interventions: Patient Declined   Readmission Risk Interventions     No data to display

## 2024-06-28 NOTE — Discharge Summary (Signed)
 " [facility]    Discharge Summary    Patient ID:  Nicole Tucker MRN: [id number] DOB/AGE: [birth year] 77 y.o.  Admit date: 06/27/2024 Discharge date: 06/28/2024 Date of Surgery: 06/27/2024 Surgeon: Surgeon(s): Magaby Rumberger, Cordella MATSU, MD  Admission Diagnosis: Carotid stenosis, right [I65.21] Symptomatic carotid artery stenosis without infarction, right [I65.21]  Discharge Diagnoses:  Carotid stenosis, right [I65.21] Symptomatic carotid artery stenosis without infarction, right [I65.21]  Secondary Diagnoses: Past Medical History:  Diagnosis Date   Arthritis    Diabetes mellitus without complication (HCC)    New DX - no meds yet   Diverticular disease    Hypertension     Procedures: CAROTID PTA/STENT INTERVENTION  Discharged Condition: good  HPI:  Patient presented to [facility] yesterday June 27, 2024 for elective right carotid stenting.  The procedure was performed as planned without intraprocedural complication.  Postprocedure she did develop some hypotension which is not unusual.  She did not respond to the usual fluid boluses and therefore dopamine  was started.  Over the next 12 hours she was weaned from her dopamine .  At the present time she is off dopamine  with an excellent blood pressure.  She is fit for discharge.  Hospital Course:  Nicole Tucker is a 79 y.o. female is S/P Right Procedures: CAROTID PTA/STENT INTERVENTION Extubated: POD # 0 Physical exam: Neurologically intact no hematoma right groin Post-op wounds healing well Pt. Ambulating, voiding and taking PO diet without difficulty. Pt pain controlled with PO pain meds. Labs as below Complications:none  Consults:    Significant Diagnostic Studies: CBC Lab Results  Component Value Date   WBC 8.2 06/28/2024   HGB 12.3 06/28/2024   HCT 35.5 (L) 06/28/2024   MCV 96.2 06/28/2024   PLT 211 06/28/2024    BMET    Component Value Date/Time   NA 135 06/28/2024  0319   NA 139 03/24/2023 0950   NA 140 12/26/2011 1920   K 4.0 06/28/2024 0319   K 3.6 12/26/2011 1920   CL 100 06/28/2024 0319   CL 103 12/26/2011 1920   CO2 25 06/28/2024 0319   CO2 26 12/26/2011 1920   GLUCOSE 94 06/28/2024 0319   GLUCOSE 90 12/26/2011 1920   BUN 15 06/28/2024 0319   BUN 9 03/24/2023 0950   BUN 19 (H) 12/26/2011 1920   CREATININE 0.73 06/28/2024 0319   CREATININE 0.76 12/26/2011 1920   CALCIUM  8.7 (L) 06/28/2024 0319   CALCIUM  9.2 12/26/2011 1920   GFRNONAA >60 06/28/2024 0319   GFRNONAA >60 12/26/2011 1920   GFRAA 85 04/10/2019 1212   GFRAA >60 12/26/2011 1920   COAG Lab Results  Component Value Date   INR 1.11 08/31/2015   INR 1.00 08/30/2015     Disposition:  Discharge to :Home Discharge Instructions     Call MD for:  redness, tenderness, or signs of infection (pain, swelling, bleeding, redness, odor or green/yellow discharge around incision site)   Complete by: As directed    Call MD for:  severe or increased pain, loss or decreased feeling  in affected limb(s)   Complete by: As directed    Call MD for:  temperature >100.5   Complete by: As directed    Discharge instructions   Complete by: As directed    Okay to shower.  Please remove the right groin bandage before showering and replace with a Band-Aid daily as needed   Driving Restrictions   Complete by: As directed    No driving for  1 week.  Okay to ride in a car as needed   Lifting restrictions   Complete by: As directed    No lifting for 1 week   No dressing needed   Complete by: As directed    Replace only if drainage present   Resume previous diet   Complete by: As directed       Allergies as of 06/28/2024       Reactions   Codeine Itching, Anxiety   Other reaction(s): Other (See Comments) Nervous        Medication List     STOP taking these medications    lisinopril  10 MG tablet Commonly known as: ZESTRIL    lisinopril  20 MG tablet Commonly known as: ZESTRIL         TAKE these medications    BIOTIN PO Take by mouth.   clopidogrel  75 MG tablet Commonly known as: Plavix  Take 1 tablet (75 mg total) by mouth daily.   COLLAGEN PO Take by mouth.   lisinopril -hydrochlorothiazide  20-25 MG tablet Commonly known as: ZESTORETIC  Take 1 tablet by mouth daily.   multivitamin with minerals Tabs tablet Take 1 tablet by mouth daily.   rosuvastatin  5 MG tablet Commonly known as: CRESTOR  Take 1 tablet (5 mg total) by mouth daily.   VITAMIN B COMPLEX PO Take by mouth daily.   VITAMIN C  PO Take by mouth daily.       Verbal and written Discharge instructions given to the patient. Wound care per Discharge AVS  Follow-up Information     Emil Weigold, Cordella MATSU, MD Follow up in 3 week(s).   Specialties: Vascular Surgery, Cardiology, Radiology, Vascular Surgery Why: follow up after procedure with a carotid duplex Contact information: [address] [place] [address] [phone]                 Signed: Cordella Shawl, MD  06/28/2024, 2:38 PM   "

## 2024-06-29 ENCOUNTER — Other Ambulatory Visit (INDEPENDENT_AMBULATORY_CARE_PROVIDER_SITE_OTHER): Payer: Self-pay

## 2024-06-29 ENCOUNTER — Telehealth: Payer: Self-pay

## 2024-06-29 MED ORDER — CLOPIDOGREL BISULFATE 75 MG PO TABS: 75.0000 mg | ORAL_TABLET | Freq: Every day | ORAL | 3 refills | Status: AC

## 2024-06-29 NOTE — Transitions of Care (Post Inpatient/ED Visit) (Signed)
 "  06/29/2024  Name: Nicole Tucker MRN: [id number] DOB: 12-Sep-1945  Today's TOC FU Call Status: Today's TOC FU Call Status:: Successful TOC FU Call Completed TOC FU Call Complete Date: 06/29/24  Patient's Name and Date of Birth confirmed. Name, DOB  Transition Care Management Follow-up Telephone Call Date of Discharge: 06/28/24 Discharge Facility: [facility]) Type of Discharge: Inpatient Admission Primary Inpatient Discharge Diagnosis:: Symtomatic carotid artery stenosis without infarction, tright How have you been since you were released from the hospital?: Better Any questions or concerns?: No  Items Reviewed: Did you receive and understand the discharge instructions provided?: Yes Medications obtained,verified, and reconciled?: Yes (Medications Reviewed) Any new allergies since your discharge?: No Dietary orders reviewed?: NA Do you have support at home?: Yes People in Home [RPT]: friend(s) Name of Support/Comfort Primary Source: [name], friend  Medications Reviewed Today: Medications Reviewed Today     Reviewed by Eilleen Richerd GRADE, RN (Registered Nurse) on 06/29/24 at 1120  Med List Status: <None>   Medication Order Taking? Sig Documenting Provider Last Dose Status Informant  Ascorbic Acid  (VITAMIN C  PO) [id number] Yes Take by mouth daily. [provider]  Active   B Complex Vitamins (VITAMIN B COMPLEX PO) [id number] Yes Take by mouth daily. [provider]  Active   BIOTIN PO [id number] Yes Take by mouth. [provider]  Active   clopidogrel  (PLAVIX ) 75 MG tablet [id number] Yes Take 1 tablet (75 mg total) by mouth daily. Schnier, Cordella MATSU, MD  Active   COLLAGEN PO [id number]  Take by mouth.  Patient not taking: Reported on 06/29/2024   [provider]  Active   lisinopril -hydrochlorothiazide  (ZESTORETIC ) 20-25 MG tablet [id number] Yes Take 1 tablet by mouth daily. Donzella Lauraine SAILOR, DO  Active   Multiple Vitamin  (MULTIVITAMIN WITH MINERALS) TABS tablet [id number] Yes Take 1 tablet by mouth daily. [provider]  Active Self  rosuvastatin  (CRESTOR ) 5 MG tablet [id number] Yes Take 1 tablet (5 mg total) by mouth daily. Donzella Lauraine SAILOR, DO  Active             Home Care and Equipment/Supplies: Were Home Health Services Ordered?: NA Any new equipment or medical supplies ordered?: NA  Functional Questionnaire: Do you need assistance with bathing/showering or dressing?: No Do you need assistance with meal preparation?: No Do you need assistance with eating?: No Do you have difficulty maintaining continence: No Do you need assistance with getting out of bed/getting out of a chair/moving?: No Do you have difficulty managing or taking your medications?: No  Follow up appointments reviewed: PCP Follow-up appointment confirmed?: No (Offered to assist with PCP follow up and declines, states she has messaged her PCP and will make her own appointment) Specialist Hospital Follow-up appointment confirmed?: No Reason Specialist Follow-Up Not Confirmed: Patient has Specialist Provider Number and will Call for Appointment (Patient to have f/u in 3 weeks will make her own appointment) Do you need transportation to your follow-up appointment?: No Do you understand care options if your condition(s) worsen?: Yes-patient verbalized understanding  SDOH Interventions Today    Flowsheet Row Most Recent Value  SDOH Interventions   Food Insecurity Interventions Intervention Not Indicated  Housing Interventions Intervention Not Indicated  Transportation Interventions Intervention Not Indicated  Utilities Interventions Intervention Not Indicated   Education for self-mgmt of  Symptomatic carotid artery stenosis without infarction, right  provided during this outreach regarding: signs of infections, redness, increased pain not controlled by medications, fever -s/s of worsening condition and  when to seek medical  attention -importance of completing all post discharge hospital hospital follow up appts -adherence to med regimen [facility] TOC 30-day program enrollment reviewed and discussed with pt/caregiver. They have declined enrollment in 30 day TOC program due to: states that she feels she has the follow up and support she needs already.  Richerd Fish, RN, BSN, CCM [facility], [facility] RN Care Manager Direct Dial: [phone]        "

## 2024-07-17 ENCOUNTER — Encounter (INDEPENDENT_AMBULATORY_CARE_PROVIDER_SITE_OTHER)

## 2024-07-17 ENCOUNTER — Ambulatory Visit (INDEPENDENT_AMBULATORY_CARE_PROVIDER_SITE_OTHER): Admitting: Vascular Surgery

## 2024-10-17 ENCOUNTER — Encounter

## 2024-10-24 ENCOUNTER — Encounter

## 2024-11-06 ENCOUNTER — Ambulatory Visit (INDEPENDENT_AMBULATORY_CARE_PROVIDER_SITE_OTHER): Admitting: Vascular Surgery

## 2024-11-06 ENCOUNTER — Encounter (INDEPENDENT_AMBULATORY_CARE_PROVIDER_SITE_OTHER)

## 2025-04-18 ENCOUNTER — Ambulatory Visit
